# Patient Record
Sex: Female | Born: 1970
Health system: Southern US, Community
[De-identification: ages and names within clinical notes are randomized; demographics above are authoritative.]

## PROBLEM LIST (undated history)

## (undated) DIAGNOSIS — C50919 Malignant neoplasm of unspecified site of unspecified female breast: Secondary | ICD-10-CM

## (undated) DIAGNOSIS — J45909 Unspecified asthma, uncomplicated: Secondary | ICD-10-CM

## (undated) DIAGNOSIS — G43109 Migraine with aura, not intractable, without status migrainosus: Secondary | ICD-10-CM

## (undated) DIAGNOSIS — N809 Endometriosis, unspecified: Secondary | ICD-10-CM

## (undated) DIAGNOSIS — F32A Depression, unspecified: Secondary | ICD-10-CM

## (undated) DIAGNOSIS — G43909 Migraine, unspecified, not intractable, without status migrainosus: Secondary | ICD-10-CM

## (undated) DIAGNOSIS — K219 Gastro-esophageal reflux disease without esophagitis: Secondary | ICD-10-CM

## (undated) DIAGNOSIS — F329 Major depressive disorder, single episode, unspecified: Secondary | ICD-10-CM

## (undated) DIAGNOSIS — F419 Anxiety disorder, unspecified: Secondary | ICD-10-CM

## (undated) DIAGNOSIS — D649 Anemia, unspecified: Secondary | ICD-10-CM

## (undated) DIAGNOSIS — I10 Essential (primary) hypertension: Secondary | ICD-10-CM

## (undated) DIAGNOSIS — E669 Obesity, unspecified: Secondary | ICD-10-CM

## (undated) DIAGNOSIS — Z8719 Personal history of other diseases of the digestive system: Secondary | ICD-10-CM

## (undated) HISTORY — DX: Major depressive disorder, single episode, unspecified: F32.9

## (undated) HISTORY — DX: Migraine, unspecified, not intractable, without status migrainosus: G43.909

## (undated) HISTORY — PX: ESOPHAGOGASTRODUODENOSCOPY: SHX1529

## (undated) HISTORY — DX: Malignant neoplasm of unspecified site of unspecified female breast: C50.919

## (undated) HISTORY — PX: REDUCTION MAMMAPLASTY: SUR839

## (undated) HISTORY — PX: TUBAL LIGATION: SHX77

## (undated) HISTORY — DX: Obesity, unspecified: E66.9

## (undated) HISTORY — DX: Migraine with aura, not intractable, without status migrainosus: G43.109

## (undated) HISTORY — DX: Personal history of other diseases of the digestive system: Z87.19

## (undated) HISTORY — DX: Anxiety disorder, unspecified: F41.9

## (undated) HISTORY — PX: CHOLECYSTECTOMY: SHX55

## (undated) HISTORY — DX: Essential (primary) hypertension: I10

## (undated) HISTORY — DX: Depression, unspecified: F32.A

## (undated) HISTORY — DX: Endometriosis, unspecified: N80.9

## (undated) HISTORY — DX: Anemia, unspecified: D64.9

---

## 1998-01-09 ENCOUNTER — Other Ambulatory Visit: Admission: RE | Admit: 1998-01-09 | Discharge: 1998-01-09 | Payer: Self-pay | Admitting: Obstetrics

## 2000-01-15 ENCOUNTER — Other Ambulatory Visit: Admission: RE | Admit: 2000-01-15 | Discharge: 2000-01-15 | Payer: Self-pay | Admitting: Family Medicine

## 2000-05-02 ENCOUNTER — Emergency Department (HOSPITAL_COMMUNITY): Admission: EM | Admit: 2000-05-02 | Discharge: 2000-05-02 | Payer: Self-pay | Admitting: Emergency Medicine

## 2003-04-20 ENCOUNTER — Encounter: Payer: Self-pay | Admitting: Emergency Medicine

## 2003-04-20 ENCOUNTER — Emergency Department (HOSPITAL_COMMUNITY): Admission: EM | Admit: 2003-04-20 | Discharge: 2003-04-20 | Payer: Self-pay | Admitting: Emergency Medicine

## 2003-07-10 ENCOUNTER — Emergency Department (HOSPITAL_COMMUNITY): Admission: EM | Admit: 2003-07-10 | Discharge: 2003-07-11 | Payer: Self-pay | Admitting: Emergency Medicine

## 2003-07-24 ENCOUNTER — Observation Stay (HOSPITAL_COMMUNITY): Admission: RE | Admit: 2003-07-24 | Discharge: 2003-07-25 | Payer: Self-pay | Admitting: General Surgery

## 2003-07-24 ENCOUNTER — Encounter (INDEPENDENT_AMBULATORY_CARE_PROVIDER_SITE_OTHER): Payer: Self-pay

## 2003-07-28 ENCOUNTER — Emergency Department (HOSPITAL_COMMUNITY): Admission: EM | Admit: 2003-07-28 | Discharge: 2003-07-28 | Payer: Self-pay | Admitting: Emergency Medicine

## 2003-09-19 ENCOUNTER — Other Ambulatory Visit: Admission: RE | Admit: 2003-09-19 | Discharge: 2003-09-19 | Payer: Self-pay | Admitting: Family Medicine

## 2004-03-18 ENCOUNTER — Encounter: Admission: RE | Admit: 2004-03-18 | Discharge: 2004-03-18 | Payer: Self-pay | Admitting: Family Medicine

## 2004-09-11 ENCOUNTER — Ambulatory Visit: Payer: Self-pay | Admitting: Family Medicine

## 2004-09-11 ENCOUNTER — Other Ambulatory Visit: Admission: RE | Admit: 2004-09-11 | Discharge: 2004-09-11 | Payer: Self-pay | Admitting: Family Medicine

## 2004-10-28 ENCOUNTER — Ambulatory Visit: Payer: Self-pay | Admitting: Family Medicine

## 2004-10-30 ENCOUNTER — Ambulatory Visit: Payer: Self-pay | Admitting: Gastroenterology

## 2004-11-10 ENCOUNTER — Ambulatory Visit: Payer: Self-pay | Admitting: Family Medicine

## 2005-03-29 ENCOUNTER — Inpatient Hospital Stay (HOSPITAL_COMMUNITY): Admission: AD | Admit: 2005-03-29 | Discharge: 2005-03-29 | Payer: Self-pay | Admitting: Obstetrics

## 2005-04-05 ENCOUNTER — Inpatient Hospital Stay (HOSPITAL_COMMUNITY): Admission: AD | Admit: 2005-04-05 | Discharge: 2005-04-07 | Payer: Self-pay | Admitting: Obstetrics

## 2005-04-05 ENCOUNTER — Inpatient Hospital Stay (HOSPITAL_COMMUNITY): Admission: AD | Admit: 2005-04-05 | Discharge: 2005-04-05 | Payer: Self-pay | Admitting: Obstetrics

## 2006-01-08 ENCOUNTER — Other Ambulatory Visit: Admission: RE | Admit: 2006-01-08 | Discharge: 2006-01-08 | Payer: Self-pay | Admitting: Family Medicine

## 2006-01-08 ENCOUNTER — Encounter: Payer: Self-pay | Admitting: Family Medicine

## 2006-01-08 ENCOUNTER — Ambulatory Visit: Payer: Self-pay | Admitting: Family Medicine

## 2006-04-30 ENCOUNTER — Ambulatory Visit: Payer: Self-pay | Admitting: Family Medicine

## 2006-10-29 ENCOUNTER — Inpatient Hospital Stay (HOSPITAL_COMMUNITY): Admission: AD | Admit: 2006-10-29 | Discharge: 2006-10-29 | Payer: Self-pay | Admitting: Obstetrics and Gynecology

## 2006-12-12 ENCOUNTER — Inpatient Hospital Stay (HOSPITAL_COMMUNITY): Admission: AD | Admit: 2006-12-12 | Discharge: 2006-12-14 | Payer: Self-pay | Admitting: Obstetrics and Gynecology

## 2007-01-19 LAB — CONVERTED CEMR LAB: Pap Smear: NORMAL

## 2007-04-04 ENCOUNTER — Encounter: Payer: Self-pay | Admitting: Internal Medicine

## 2007-04-04 ENCOUNTER — Ambulatory Visit: Payer: Self-pay | Admitting: Family Medicine

## 2007-04-04 LAB — CONVERTED CEMR LAB
Bilirubin Urine: NEGATIVE
Blood in Urine, dipstick: NEGATIVE
Glucose, Urine, Semiquant: NEGATIVE
Ketones, urine, test strip: NEGATIVE
Nitrite: NEGATIVE
Protein, U semiquant: NEGATIVE
Specific Gravity, Urine: 1.02
Urobilinogen, UA: NEGATIVE
WBC Urine, dipstick: NEGATIVE
pH: 6

## 2007-04-08 LAB — CONVERTED CEMR LAB
ALT: 20 units/L (ref 0–35)
AST: 21 units/L (ref 0–37)
Albumin: 3.8 g/dL (ref 3.5–5.2)
Alkaline Phosphatase: 47 units/L (ref 39–117)
BUN: 6 mg/dL (ref 6–23)
Basophils Absolute: 0 10*3/uL (ref 0.0–0.1)
Basophils Relative: 0.7 % (ref 0.0–1.0)
Bilirubin, Direct: 0.2 mg/dL (ref 0.0–0.3)
CO2: 27 meq/L (ref 19–32)
Calcium: 8.9 mg/dL (ref 8.4–10.5)
Chloride: 106 meq/L (ref 96–112)
Cholesterol: 131 mg/dL (ref 0–200)
Creatinine, Ser: 0.7 mg/dL (ref 0.4–1.2)
Eosinophils Absolute: 0.1 10*3/uL (ref 0.0–0.6)
Eosinophils Relative: 2.1 % (ref 0.0–5.0)
GFR calc Af Amer: 123 mL/min
GFR calc non Af Amer: 102 mL/min
Glucose, Bld: 78 mg/dL (ref 70–99)
HCT: 35.7 % — ABNORMAL LOW (ref 36.0–46.0)
HDL: 33.8 mg/dL — ABNORMAL LOW (ref >39.0)
Hemoglobin: 12.4 g/dL (ref 12.0–15.0)
LDL Cholesterol: 84 mg/dL (ref 0–99)
Lymphocytes Relative: 54.2 % — ABNORMAL HIGH (ref 12.0–46.0)
MCHC: 34.6 g/dL (ref 30.0–36.0)
MCV: 91.1 fL (ref 78.0–100.0)
Monocytes Absolute: 0.3 10*3/uL (ref 0.2–0.7)
Monocytes Relative: 5.3 % (ref 3.0–11.0)
Neutro Abs: 2 10*3/uL (ref 1.4–7.7)
Neutrophils Relative %: 37.7 % — ABNORMAL LOW (ref 43.0–77.0)
Platelets: 210 10*3/uL (ref 150–400)
Potassium: 3.3 meq/L — ABNORMAL LOW (ref 3.5–5.1)
RBC: 3.92 M/uL (ref 3.87–5.11)
RDW: 14.4 % (ref 11.5–14.6)
Sodium: 141 meq/L (ref 135–145)
TSH: 0.55 microintl units/mL (ref 0.35–5.50)
Total Bilirubin: 1.2 mg/dL (ref 0.3–1.2)
Total CHOL/HDL Ratio: 3.9
Total Protein: 7.5 g/dL (ref 6.0–8.3)
Triglycerides: 66 mg/dL (ref 0–149)
VLDL: 13 mg/dL (ref 0–40)
WBC: 5.2 10*3/uL (ref 4.5–10.5)

## 2007-05-24 ENCOUNTER — Ambulatory Visit: Payer: Self-pay | Admitting: Family Medicine

## 2007-09-16 ENCOUNTER — Ambulatory Visit (HOSPITAL_COMMUNITY): Admission: RE | Admit: 2007-09-16 | Discharge: 2007-09-16 | Payer: Self-pay | Admitting: Obstetrics and Gynecology

## 2009-04-26 ENCOUNTER — Other Ambulatory Visit: Admission: RE | Admit: 2009-04-26 | Discharge: 2009-04-26 | Payer: Self-pay | Admitting: Family Medicine

## 2009-04-26 ENCOUNTER — Encounter: Payer: Self-pay | Admitting: Family Medicine

## 2009-04-26 ENCOUNTER — Ambulatory Visit: Payer: Self-pay | Admitting: Family Medicine

## 2009-04-27 LAB — CONVERTED CEMR LAB
ALT: 11 units/L (ref 0–35)
AST: 14 units/L (ref 0–37)
Albumin: 3.3 g/dL — ABNORMAL LOW (ref 3.5–5.2)
Alkaline Phosphatase: 44 units/L (ref 39–117)
BUN: 9 mg/dL (ref 6–23)
Basophils Absolute: 0 10*3/uL (ref 0.0–0.1)
Basophils Relative: 0.2 % (ref 0.0–3.0)
Bilirubin, Direct: 0.2 mg/dL (ref 0.0–0.3)
CO2: 25 meq/L (ref 19–32)
Calcium: 9 mg/dL (ref 8.4–10.5)
Chloride: 105 meq/L (ref 96–112)
Cholesterol: 137 mg/dL (ref 0–200)
Creatinine, Ser: 0.7 mg/dL (ref 0.4–1.2)
Eosinophils Absolute: 0.1 10*3/uL (ref 0.0–0.7)
Eosinophils Relative: 1.6 % (ref 0.0–5.0)
GFR calc non Af Amer: 121.7 mL/min (ref >60)
Glucose, Bld: 85 mg/dL (ref 70–99)
HCT: 39.2 % (ref 36.0–46.0)
HDL: 36.1 mg/dL — ABNORMAL LOW (ref >39.00)
Hemoglobin: 13 g/dL (ref 12.0–15.0)
LDL Cholesterol: 91 mg/dL (ref 0–99)
Lymphocytes Relative: 46.1 % — ABNORMAL HIGH (ref 12.0–46.0)
Lymphs Abs: 3.2 10*3/uL (ref 0.7–4.0)
MCHC: 33.1 g/dL (ref 30.0–36.0)
MCV: 85.9 fL (ref 78.0–100.0)
Monocytes Absolute: 0.4 10*3/uL (ref 0.1–1.0)
Monocytes Relative: 5.3 % (ref 3.0–12.0)
Neutro Abs: 3.2 10*3/uL (ref 1.4–7.7)
Neutrophils Relative %: 46.8 % (ref 43.0–77.0)
Platelets: 176 10*3/uL (ref 150.0–400.0)
Potassium: 4.2 meq/L (ref 3.5–5.1)
RBC: 4.56 M/uL (ref 3.87–5.11)
RDW: 13.8 % (ref 11.5–14.6)
Sodium: 139 meq/L (ref 135–145)
TSH: 0.93 microintl units/mL (ref 0.35–5.50)
Total Bilirubin: 0.8 mg/dL (ref 0.3–1.2)
Total CHOL/HDL Ratio: 4
Total Protein: 7 g/dL (ref 6.0–8.3)
Triglycerides: 52 mg/dL (ref 0.0–149.0)
VLDL: 10.4 mg/dL (ref 0.0–40.0)
WBC: 6.9 10*3/uL (ref 4.5–10.5)

## 2009-05-02 ENCOUNTER — Encounter (INDEPENDENT_AMBULATORY_CARE_PROVIDER_SITE_OTHER): Payer: Self-pay | Admitting: *Deleted

## 2010-01-27 ENCOUNTER — Telehealth (INDEPENDENT_AMBULATORY_CARE_PROVIDER_SITE_OTHER): Payer: Self-pay | Admitting: *Deleted

## 2010-01-29 ENCOUNTER — Telehealth: Payer: Self-pay | Admitting: Family Medicine

## 2010-02-06 ENCOUNTER — Encounter: Admission: RE | Admit: 2010-02-06 | Discharge: 2010-02-06 | Payer: Self-pay | Admitting: Family Medicine

## 2010-05-02 ENCOUNTER — Ambulatory Visit: Payer: Self-pay | Admitting: Family Medicine

## 2010-05-02 ENCOUNTER — Other Ambulatory Visit: Admission: RE | Admit: 2010-05-02 | Discharge: 2010-05-02 | Payer: Self-pay | Admitting: Family Medicine

## 2010-05-02 LAB — CONVERTED CEMR LAB
Bilirubin Urine: NEGATIVE
Glucose, Urine, Semiquant: NEGATIVE
Ketones, urine, test strip: NEGATIVE
Nitrite: NEGATIVE
Protein, U semiquant: NEGATIVE
Specific Gravity, Urine: 1.02
Urobilinogen, UA: NEGATIVE
WBC Urine, dipstick: NEGATIVE
pH: 5

## 2010-05-05 LAB — CONVERTED CEMR LAB
ALT: 11 units/L (ref 0–35)
AST: 15 units/L (ref 0–37)
Albumin: 3.5 g/dL (ref 3.5–5.2)
Alkaline Phosphatase: 56 units/L (ref 39–117)
BUN: 12 mg/dL (ref 6–23)
Basophils Absolute: 0 10*3/uL (ref 0.0–0.1)
Basophils Relative: 0.6 % (ref 0.0–3.0)
Bilirubin, Direct: 0.1 mg/dL (ref 0.0–0.3)
CO2: 27 meq/L (ref 19–32)
Calcium: 9 mg/dL (ref 8.4–10.5)
Chloride: 104 meq/L (ref 96–112)
Cholesterol: 128 mg/dL (ref 0–200)
Creatinine, Ser: 0.8 mg/dL (ref 0.4–1.2)
Eosinophils Absolute: 0.2 10*3/uL (ref 0.0–0.7)
Eosinophils Relative: 2.8 % (ref 0.0–5.0)
GFR calc non Af Amer: 110.06 mL/min (ref >60)
Glucose, Bld: 81 mg/dL (ref 70–99)
HCT: 36.4 % (ref 36.0–46.0)
HDL: 38.1 mg/dL — ABNORMAL LOW (ref >39.00)
Hemoglobin: 12.1 g/dL (ref 12.0–15.0)
LDL Cholesterol: 84 mg/dL (ref 0–99)
Lymphocytes Relative: 38.9 % (ref 12.0–46.0)
Lymphs Abs: 2.2 10*3/uL (ref 0.7–4.0)
MCHC: 33.4 g/dL (ref 30.0–36.0)
MCV: 82.8 fL (ref 78.0–100.0)
Monocytes Absolute: 0.5 10*3/uL (ref 0.1–1.0)
Monocytes Relative: 8.5 % (ref 3.0–12.0)
Neutro Abs: 2.8 10*3/uL (ref 1.4–7.7)
Neutrophils Relative %: 49.2 % (ref 43.0–77.0)
Platelets: 269 10*3/uL (ref 150.0–400.0)
Potassium: 4.1 meq/L (ref 3.5–5.1)
RBC: 4.39 M/uL (ref 3.87–5.11)
RDW: 16.3 % — ABNORMAL HIGH (ref 11.5–14.6)
Sodium: 139 meq/L (ref 135–145)
TSH: 0.67 microintl units/mL (ref 0.35–5.50)
Total Bilirubin: 0.5 mg/dL (ref 0.3–1.2)
Total CHOL/HDL Ratio: 3
Total Protein: 7.2 g/dL (ref 6.0–8.3)
Triglycerides: 32 mg/dL (ref 0.0–149.0)
VLDL: 6.4 mg/dL (ref 0.0–40.0)
WBC: 5.7 10*3/uL (ref 4.5–10.5)

## 2010-05-08 ENCOUNTER — Encounter (INDEPENDENT_AMBULATORY_CARE_PROVIDER_SITE_OTHER): Payer: Self-pay | Admitting: *Deleted

## 2010-05-08 LAB — CONVERTED CEMR LAB: Pap Smear: NEGATIVE

## 2010-05-16 ENCOUNTER — Emergency Department (HOSPITAL_COMMUNITY): Admission: EM | Admit: 2010-05-16 | Discharge: 2010-05-16 | Payer: Self-pay | Admitting: Emergency Medicine

## 2010-06-02 ENCOUNTER — Ambulatory Visit: Payer: Self-pay | Admitting: Family Medicine

## 2010-06-02 LAB — CONVERTED CEMR LAB: Rapid Strep: POSITIVE

## 2010-10-05 ENCOUNTER — Encounter (HOSPITAL_COMMUNITY): Payer: Self-pay | Admitting: Obstetrics and Gynecology

## 2010-10-16 NOTE — Progress Notes (Signed)
Summary: Note for Weight Watchers  Phone Note Call from Patient Call back at Home Phone [phone]   Caller: Patient Summary of Call: Message Left on Danielle's VM: Patient needs something from the Dr. indicating that it is ok for her do do weight watchers so that she can use her FSA (Flexibile spending account)    Jody [name]  Jan 29, 2010 5:01 PM   Follow-up for Phone Call        ok to give note Follow-up by: Loreen Freud DO,  Jan 30, 2010 8:17 AM  Additional Follow-up for Phone Call Additional follow up Details #1::        Patient would like this written on rx.  Additional Follow-up by: Harold Barban,  Feb 03, 2010 10:08 AM    New/Updated Medications: * WEIGHT WATCHERS 278.0 Prescriptions: WEIGHT WATCHERS 278.0  #1 x 0   Entered and Authorized by:   Loreen Freud DO   Signed by:   Loreen Freud DO on 02/03/2010   Method used:   Print then Give to Patient   RxID:   [id number]

## 2010-10-16 NOTE — Progress Notes (Signed)
Summary: re;mamogram  Phone Note Call from Patient   Caller: Patient Summary of Call: mother was dx with breast cancer in november, PT NOT HAVING ANY SX;  Wanted to know should she have mammogram sooner. --Scheduled pt CPX 05/02/10,  .Kandice Hams  Jan 27, 2010 3:50 PM  Initial call taken by: Kandice Hams,  Jan 27, 2010 3:50 PM  Follow-up for Phone Call        she could have her baseline now and if normal she would have them annually at age 40---  she can schedule at the breast center or we can do it for her. Follow-up by: Loreen Freud DO,  Jan 27, 2010 4:52 PM  Additional Follow-up for Phone Call Additional follow up Details #1::        left msg for pt  per Dr Laury Axon should have baseline now and order is in , our referral coordinator will be calling with a date and time, any questions give our office a call .Kandice Hams  Jan 27, 2010 5:04 PM  Additional Follow-up by: Kandice Hams,  Jan 27, 2010 5:04 PM  New Problems: FAMILY HISTORY BREAST CANCER 1ST DEGREE RELATIVE <50 (ICD-V16.3)   New Problems: FAMILY HISTORY BREAST CANCER 1ST DEGREE RELATIVE <50 (ICD-V16.3)    Family History: Family History Hypertension Family History Breast cancer 1st degree relative <50

## 2010-10-16 NOTE — Assessment & Plan Note (Signed)
Summary: cpx/alr   Vital Signs:  Patient profile:   40 year old female Height:      65.25 inches Weight:      286 pounds BMI:     47.40 Temp:     98.7 degrees F oral Pulse rate:   80 / minute Resp:     20 per minute BP sitting:   130 / 82  (left arm)  Vitals Entered By: Jeremy Johann CMA (May 02, 2010 8:42 AM) CC: cpx,pap, fasting  Vision Screening:      Vision Comments:  vision corrected with glasses/or contacts---sees optho q1y 40db HL: Left  Right  Audiometry Comment: grossly normal    History of Present Illness: Pt here for cpe , pap, and labs.  Pt has eye dr appointment today--pt is having itchy red eyes.   Pt mother dx with breast Cancer and things are not going as well as planned.  Pt is stressed.   Preventive Screening-Counseling & Management  Alcohol-Tobacco     Alcohol drinks/day: 0     Smoking Status: never     Passive Smoke Exposure: no  Caffeine-Diet-Exercise     Caffeine use/day: 2     Does Patient Exercise: yes     Type of exercise: walk      Exercise (avg: min/session): <30     Times/week: 5  Hep-HIV-STD-Contraception     HIV Risk: no     Dental Visit-last 6 months yes     Dental Care Counseling: not indicated; dental care within six months     SBE monthly: yes     SBE Education/Counseling: not indicated; SBE done regularly  Safety-Violence-Falls     Seat Belt Use: yes      Drug Use:  never.    Current Medications (verified): 1)  Weight Watchers .Marland Kitchen.. 278.0  Allergies (verified): No Known Drug Allergies  Past History:  Past Medical History: Last updated: 04/04/2007 Unremarkable  Past Surgical History: Last updated: 04/26/2009 Cholecystectomy (2005) sterilization procedure 07/2007--- Dr Renaldo Fiddler  Family History: Last updated: 05/02/2010 Family History Hypertension Family History Breast cancer 1st degree relative <50 Family History of CAD Female 1st degree relative <60 Family History Weight disorder fam hx cataracts fam  hx glaucoma  Social History: Last updated: 04/04/2007 Occupation:proc. ins.-- [employer] Never Smoked Alcohol use-no Drug use-no Regular exercise-yes  Risk Factors: Alcohol Use: 0 (05/02/2010) Caffeine Use: 2 (05/02/2010) Exercise: yes (05/02/2010)  Risk Factors: Smoking Status: never (05/02/2010) Passive Smoke Exposure: no (05/02/2010)  Family History: Reviewed history from 01/27/2010 and no changes required. Family History Hypertension Family History Breast cancer 1st degree relative <50 Family History of CAD Female 1st degree relative <60 Family History Weight disorder fam hx cataracts fam hx glaucoma  Social History: Reviewed history from 04/04/2007 and no changes required. 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 Eyes:  Complains of itching and red eye; denies blurring, discharge, double vision, eye irritation, eye pain, halos, light sensitivity, vision loss-1 eye, and vision loss-both eyes; pt to see optho today--[facility]. ENT:  Denies decreased hearing, difficulty swallowing, ear discharge, earache, hoarseness, nasal congestion, nosebleeds, postnasal drainage, ringing in ears, sinus pressure, and sore throat. CV:  Denies bluish discoloration of lips or nails, chest pain or discomfort, difficulty breathing at night, difficulty breathing while lying down,  fainting, fatigue, leg cramps with exertion, lightheadness, near fainting, palpitations, shortness of breath with exertion, swelling of feet, swelling of hands, and weight gain. Resp:  Denies chest discomfort, chest pain with inspiration, cough, coughing up blood, excessive snoring, hypersomnolence, morning headaches, pleuritic, shortness of breath, sputum productive, and  wheezing. GI:  Denies abdominal pain, bloody stools, change in bowel habits, constipation, dark tarry stools, diarrhea, excessive appetite, gas, hemorrhoids, indigestion, loss of appetite, and nausea. GU:  Denies abnormal vaginal bleeding, decreased libido, discharge, dysuria, genital sores, hematuria, incontinence, nocturia, urinary frequency, and urinary hesitancy. MS:  Denies joint pain, joint redness, joint swelling, loss of strength, low back pain, mid back pain, muscle aches, muscle , cramps, muscle weakness, stiffness, and thoracic pain. Derm:  Denies changes in color of skin, changes in nail beds, dryness, excessive perspiration, flushing, hair loss, insect bite(s), itching, lesion(s), poor wound healing, and rash. Neuro:  Denies brief paralysis, difficulty with concentration, disturbances in coordination, falling down, headaches, inability to speak, memory loss, numbness, poor balance, seizures, sensation of room spinning, tingling, tremors, visual disturbances, and weakness. Psych:  Denies alternate hallucination ( auditory/visual), anxiety, depression, easily angered, easily tearful, irritability, mental problems, panic attacks, sense of great danger, suicidal thoughts/plans, thoughts of violence, unusual visions or sounds, and thoughts /plans of harming others. Endo:  Denies cold intolerance, excessive hunger, excessive thirst, excessive urination, heat intolerance, polyuria, and weight change. Heme:  Denies abnormal bruising, bleeding, enlarge lymph nodes, fevers, pallor, and skin discoloration. Allergy:  Denies hives or rash, itching eyes, persistent infections, seasonal allergies, and sneezing.  Physical Exam  General:  Well-developed,well-nourished,in no acute distress; alert,appropriate and cooperative throughout examinationoverweight-appearing.   Head:  Normocephalic and atraumatic without obvious abnormalities. No apparent alopecia or balding. Eyes:  vision grossly intact, pupils  equal, pupils round, pupils reactive to light, and no injection.   Ears:  External ear exam shows no significant lesions or deformities.  Otoscopic examination reveals clear canals, tympanic membranes are intact bilaterally without bulging, retraction, inflammation or discharge. Hearing is grossly normal bilaterally. Nose:  External nasal examination shows no deformity or inflammation. Nasal mucosa are pink and moist without lesions or exudates. Mouth:  Oral mucosa and oropharynx without lesions or exudates.  Teeth in good repair. Neck:  No deformities, masses, or tenderness noted. Chest Wall:  No deformities, masses, or tenderness noted. Breasts:  No mass, nodules, thickening, tenderness, bulging, retraction, inflamation, nipple discharge or skin changes noted.   Lungs:  Normal respiratory effort, chest expands symmetrically. Lungs are clear to auscultation, no crackles or wheezes. Heart:  normal rate and no murmur.   Abdomen:  Bowel sounds positive,abdomen soft and non-tender without masses, organomegaly or hernias noted. Genitalia:  Pelvic Exam:        External: normal female genitalia without lesions or masses        Vagina: normal without lesions or masses        Cervix: normal without lesions or masses        Adnexa: normal bimanual exam without masses or fullness        Uterus: normal by palpation        Pap smear: performed Msk:  normal ROM, no joint tenderness, no joint swelling, no joint warmth, no redness over joints, no joint deformities, no joint instability, and no crepitation.   Pulses:  R posterior tibial normal, R dorsalis pedis normal, L posterior tibial normal, and L dorsalis pedis normal.   Extremities:  No clubbing, cyanosis, edema, or deformity noted with  normal full range of motion of all joints.   Neurologic:  No cranial nerve deficits noted. Station and gait are normal. Plantar reflexes are down-going bilaterally. DTRs are symmetrical throughout. Sensory, motor and  coordinative functions appear intact. Skin:  Intact without suspicious lesions or rashes Cervical Nodes:  No lymphadenopathy noted Axillary Nodes:  No palpable lymphadenopathy Psych:  Cognition and judgment appear intact. Alert and cooperative with normal attention span and concentration. No apparent delusions, illusions, hallucinations   Impression & Recommendations:  Problem # 1:  PREVENTIVE HEALTH CARE (ICD-V70.0) ghm utd check labs Orders: Venipuncture (21308) TLB-Lipid Panel (80061-LIPID) TLB-BMP (Basic Metabolic Panel-BMET) (80048-METABOL) TLB-CBC Platelet - w/Differential (85025-CBCD) TLB-Hepatic/Liver Function Pnl (80076-HEPATIC) TLB-TSH (Thyroid Stimulating Hormone) (84443-TSH) Specimen Handling (65784) UA Dipstick w/o Micro (manual) (81002)  Problem # 2:  OBESITY, MORBID (ICD-278.01) diet and exercise d/w pt Ht: 65.25 (05/02/2010)   Wt: 286 (05/02/2010)   BMI: 47.40 (05/02/2010)  Complete Medication List: 1)  Weight Watchers  .Marland Kitchen.. 278.0 pt may participate in weight watchers Prescriptions: WEIGHT WATCHERS 278.0 Pt may participate in weight watchers  #1 x 0   Entered and Authorized by:   Loreen Freud DO   Signed by:   Loreen Freud DO on 05/02/2010   Method used:   Print then Give to Patient   RxID:   [id number]   Flu Vaccine Next Due:  Not Indicated   Mammogram  Procedure date:  05/02/2010  Findings:      No specific mammographic evidence of malignancy.  [facility]: [facility].     Laboratory Results   Urine Tests   Date/Time Reported: May 02, 2010 9:52 AM   Routine Urinalysis   Color: straw Appearance: Clear Glucose: negative   (Normal Range: Negative) Bilirubin: negative   (Normal Range: Negative) Ketone: negative   (Normal Range: Negative) Spec. Gravity: 1.020   (Normal Range: 1.003-1.035) Blood: moderate   (Normal Range: Negative) pH: 5.0   (Normal Range: 5.0-8.0) Protein: negative   (Normal Range:  Negative) Urobilinogen: negative   (Normal Range: 0-1) Nitrite: negative   (Normal Range: Negative) Leukocyte Esterace: negative   (Normal Range: Negative)    Comments: Floydene Flock  May 02, 2010 9:52 AM pt. had pap  (blood)  no cx sent

## 2010-10-16 NOTE — Assessment & Plan Note (Signed)
Summary: STREP THROAT??//KN   Vital Signs:  Patient profile:   40 year old female Height:      65.25 inches (165.74 cm) Weight:      286.50 pounds (130.23 kg) BMI:     47.48 Temp:     100.0 degrees F (37.78 degrees C) oral BP sitting:   128 / 86  (left arm) Cuff size:   large  Vitals Entered By: Lucious Groves CMA (June 02, 2010 11:41 AM) CC: C/O possible strep throat./kb Is Patient Diabetic? No Pain Assessment Patient in pain? no      Comments Patient notes that both of her children were verified positive this morning. Patient notes that she has been having sore throat, diarrhea, HA, and body aches. Patient was unsure of fever./kb   History of Present Illness: 40 yo woman here today for ? strep throat.  both children have strep.  sxs started early this AM w/ sore throat.  + HA and body aches.  + diarrhea this AM x1.  unsure of fever.  Current Medications (verified): 1)  Weight Watchers .Marland Kitchen.. 278.0 Pt May Participate in Weight Watchers  Allergies (verified): No Known Drug Allergies  Review of Systems      See HPI  Physical Exam  General:  Well-developed,well-nourished,in no acute distress; alert,appropriate and cooperative throughout examinationoverweight-appearing.   Head:  Normocephalic and atraumatic without obvious abnormalities. No apparent alopecia or balding.  no TTP over sinuses Eyes:  no injxn or inflammation Ears:  R ear normal and L ear normal.   Nose:  External nasal examination shows no deformity or inflammation. Nasal mucosa are pink and moist without lesions or exudates. Mouth:  tonsilar enlargement w/ erythema and exudate Neck:  + ant chain LAD Lungs:  Normal respiratory effort, chest expands symmetrically. Lungs are clear to auscultation, no crackles or wheezes. Heart:  normal rate and no murmur.     Impression & Recommendations:  Problem # 1:  PHARYNGITIS-ACUTE (ICD-462) Assessment New  pt w/ + strep test.  start amox.  reviewed supportive care  and red flags that should prompt return.  Pt expresses understanding and is in agreement w/ this plan. Her updated medication list for this problem includes:    Amoxicillin 500 Mg Tabs (Amoxicillin) .Marland Kitchen... 1 tab by mouth two times a day x10 days.  take w/ food.  Orders: Rapid Strep (60454)  Complete Medication List: 1)  Weight Watchers  .Marland Kitchen.. 278.0 pt may participate in weight watchers 2)  Amoxicillin 500 Mg Tabs (Amoxicillin) .Marland Kitchen.. 1 tab by mouth two times a day x10 days.  take w/ food.  Patient Instructions: 1)  This is strep throat 2)  Take the Amoxicillin as directed 3)  Tylenol/Ibuprofen as needed for pain or fever 4)  Drink plenty of fluids 5)  Hang in there! Prescriptions: AMOXICILLIN 500 MG TABS (AMOXICILLIN) 1 tab by mouth two times a day x10 days.  take w/ food.  #20 x 0   Entered and Authorized by:   Neena Rhymes MD   Signed by:   Neena Rhymes MD on 06/02/2010   Method used:   Electronically to        [facility]  [address] [phone]* (retail)       [address]       [place], [place]  [id number]       Ph: [phone] or [phone]       Fax: [phone]   RxID:  [id number]   Laboratory Results    Wet [place]/KOH  Other Tests  Rapid Strep: positive  Kit Test Internal QC: Positive   (Normal Range: Negative)

## 2010-10-16 NOTE — Letter (Signed)
Summary: Results Follow up Letter  [facility] [place]  [address] [place], [place] [address]   Phone: [phone]  Fax: [phone]    05/08/2010 MRN: [id number]  [facility] [address] [place], [place]  [address]  Dear Ms. Teaney,  The following are the results of your recent test(s):  Test         Result    Pap Smear:        Normal __X___  Not Normal _____ Comments: ______________________________________________________ Cholesterol: LDL(Bad cholesterol):         Your goal is less than:         HDL (Good cholesterol):       Your goal is more than: Comments:  ______________________________________________________ Mammogram:        Normal _____  Not Normal _____ Comments:  ___________________________________________________________________ Hemoccult:        Normal _____  Not normal _______ Comments:    _____________________________________________________________________ Other Tests:    We routinely do not discuss normal results over the telephone.  If you desire a copy of the results, or you have any questions about this information we can discuss them at your next office visit.   Sincerely,

## 2010-11-27 LAB — POCT RAPID STREP A (OFFICE): Streptococcus, Group A Screen (Direct): POSITIVE — AB

## 2010-12-10 ENCOUNTER — Inpatient Hospital Stay (INDEPENDENT_AMBULATORY_CARE_PROVIDER_SITE_OTHER)
Admission: RE | Admit: 2010-12-10 | Discharge: 2010-12-10 | Disposition: A | Discharge status: Home / Self Care | Payer: BC Managed Care – PPO | Source: Ambulatory Visit | Attending: Emergency Medicine | Admitting: Emergency Medicine

## 2010-12-10 DIAGNOSIS — R51 Headache: Secondary | ICD-10-CM

## 2010-12-10 DIAGNOSIS — H53139 Sudden visual loss, unspecified eye: Secondary | ICD-10-CM

## 2010-12-11 ENCOUNTER — Observation Stay (HOSPITAL_COMMUNITY)
Admission: EM | Admit: 2010-12-11 | Discharge: 2010-12-12 | Disposition: A | Discharge status: Home / Self Care | Payer: BC Managed Care – PPO | Attending: Internal Medicine | Admitting: Internal Medicine

## 2010-12-11 ENCOUNTER — Observation Stay (HOSPITAL_COMMUNITY): Payer: BC Managed Care – PPO

## 2010-12-11 ENCOUNTER — Emergency Department (HOSPITAL_COMMUNITY): Payer: BC Managed Care – PPO

## 2010-12-11 DIAGNOSIS — H539 Unspecified visual disturbance: Secondary | ICD-10-CM | POA: Insufficient documentation

## 2010-12-11 DIAGNOSIS — E538 Deficiency of other specified B group vitamins: Secondary | ICD-10-CM | POA: Insufficient documentation

## 2010-12-11 DIAGNOSIS — G43909 Migraine, unspecified, not intractable, without status migrainosus: Principal | ICD-10-CM | POA: Insufficient documentation

## 2010-12-11 LAB — APTT: aPTT: 28 seconds (ref 24–37)

## 2010-12-11 LAB — URINALYSIS, ROUTINE W REFLEX MICROSCOPIC
Bilirubin Urine: NEGATIVE
Glucose, UA: NEGATIVE mg/dL
Hgb urine dipstick: NEGATIVE
Ketones, ur: NEGATIVE mg/dL
Nitrite: NEGATIVE
Protein, ur: NEGATIVE mg/dL
Specific Gravity, Urine: 1.023 (ref 1.005–1.030)
Urobilinogen, UA: 0.2 mg/dL (ref 0.0–1.0)
pH: 6 (ref 5.0–8.0)

## 2010-12-11 LAB — RAPID URINE DRUG SCREEN, HOSP PERFORMED
Amphetamines: NOT DETECTED
Barbiturates: NOT DETECTED
Benzodiazepines: NOT DETECTED
Cocaine: NOT DETECTED
Opiates: NOT DETECTED
Tetrahydrocannabinol: NOT DETECTED

## 2010-12-11 LAB — COMPREHENSIVE METABOLIC PANEL
ALT: 13 U/L (ref 0–35)
AST: 24 U/L (ref 0–37)
Albumin: 3.5 g/dL (ref 3.5–5.2)
Alkaline Phosphatase: 58 U/L (ref 39–117)
BUN: 9 mg/dL (ref 6–23)
CO2: 25 mEq/L (ref 19–32)
Calcium: 9.1 mg/dL (ref 8.4–10.5)
Chloride: 105 mEq/L (ref 96–112)
Creatinine, Ser: 0.8 mg/dL (ref 0.4–1.2)
GFR calc Af Amer: >60 mL/min (ref >60)
GFR calc non Af Amer: >60 mL/min (ref >60)
Glucose, Bld: 83 mg/dL (ref 70–99)
Potassium: 4.1 mEq/L (ref 3.5–5.1)
Sodium: 137 mEq/L (ref 135–145)
Total Bilirubin: 0.5 mg/dL (ref 0.3–1.2)
Total Protein: 7.7 g/dL (ref 6.0–8.3)

## 2010-12-11 LAB — TROPONIN I: Troponin I: 0.02 ng/mL (ref 0.00–0.06)

## 2010-12-11 LAB — CBC
HCT: 39.4 % (ref 36.0–46.0)
Hemoglobin: 12.4 g/dL (ref 12.0–15.0)
MCH: 26 pg (ref 26.0–34.0)
MCHC: 31.5 g/dL (ref 30.0–36.0)
MCV: 82.6 fL (ref 78.0–100.0)
Platelets: 291 10*3/uL (ref 150–400)
RBC: 4.77 MIL/uL (ref 3.87–5.11)
RDW: 15.5 % (ref 11.5–15.5)
WBC: 6 10*3/uL (ref 4.0–10.5)

## 2010-12-11 LAB — PROTIME-INR
INR: 1.03 (ref 0.00–1.49)
Prothrombin Time: 13.7 seconds (ref 11.6–15.2)

## 2010-12-11 LAB — PREGNANCY, URINE: Preg Test, Ur: NEGATIVE

## 2010-12-11 LAB — CK TOTAL AND CKMB (NOT AT ARMC)
Relative Index: 0.4 (ref 0.0–2.5)
Total CK: 223 U/L — ABNORMAL HIGH (ref 7–177)

## 2010-12-11 LAB — ETHANOL: Alcohol, Ethyl (B): <5 mg/dL (ref 0–10)

## 2010-12-12 DIAGNOSIS — H5310 Unspecified subjective visual disturbances: Secondary | ICD-10-CM

## 2010-12-12 DIAGNOSIS — I059 Rheumatic mitral valve disease, unspecified: Secondary | ICD-10-CM

## 2010-12-12 LAB — CARDIAC PANEL(CRET KIN+CKTOT+MB+TROPI)
CK, MB: 0.9 ng/mL (ref 0.3–4.0)
CK, MB: 0.7 ng/mL (ref 0.3–4.0)
Total CK: 163 U/L (ref 7–177)
Troponin I: 0.01 ng/mL (ref 0.00–0.06)

## 2010-12-12 LAB — LIPID PANEL
Cholesterol: 125 mg/dL (ref 0–200)
LDL Cholesterol: 77 mg/dL (ref 0–99)

## 2010-12-12 NOTE — H&P (Signed)
[name], [name]             ACCOUNT NO.:  [id number]  MEDICAL RECORD NO.:  [id number]           PATIENT TYPE:  E  LOCATION:  [facility]                         FACILITY:  [facility]  PHYSICIAN:  Gwenyth Bender, NP      DATE OF BIRTH:  Aug 28, 1971  DATE OF ADMISSION:  12/11/2010 DATE OF DISCHARGE:                             HISTORY & PHYSICAL   PRIMARY CARE PROVIDER:  Lelon Perla, DO with [facility].  CHIEF COMPLAINT:  Visual disturbances.  HISTORY OF PRESENT ILLNESS:  Jody [name] is a very pleasant 40 year old female with a history of migraine headaches who presents to the [facility] ED with a chief complaint of visual disturbances.  Information is obtained from the patient.  She reports that yesterday she experienced some visual obscurities while sitting at her computer at work.  She describes as being able to see color and light but no letters or words on the computer screen.  Associated symptoms do include a posterior headache, some mild nausea and dizziness.  She states that she put her head down on the desk and waited for it to pass, which it did after about 20 minutes.  She denies any numbness, tingling, chest pain, palpitations, shortness of breath, or difficulty swallowing or speaking. She does report some photophobia.  She also reports that she has had episodes similar to this approximately every other day for the last 3 weeks or so.  She does indicate that prior episodes were much briefer, specifically less than 5 minutes.  She indicates that yesterday the episode lasted longer and it frightened her so she called her primary care provider who recommended that she go to the Urgent Care Center. She went to the [facility] and they initiated workup and she had to wait but could not stay to complete the workup as she is a single parent, had to pick up her small children.  She returned today to complete the workup.  The symptoms have not reoccurred.  She has  not had an episode today.  She states that she was able to eat a small dinner last night but did have difficulty sleeping due to anxiety.  She indicates she took some ibuprofen for the headache with little relief. She also reports she has been under a lot of stress with a full-time job, three children under the age of 61, and her disabled father recently moved in with her and she is now the primary caregiver for him as well. Symptoms came on gradually, have persisted, and are characterized as moderate.  Nothing makes them better or worse.  We are asked to admit for further evaluation and treatment.  ALLERGIES:  No known drug allergies.  PAST MEDICAL HISTORY:  Migraine.  PAST SURGICAL HISTORY:  Cholecystectomy approximately 5 years ago.  FAMILY MEDICAL HISTORY:  Mother is alive 71 years old, is breast cancer survivor, has hypertension.  Father is alive, 65 years old, has hepatitis C, has had a hip replaced, and also has hypertension.  She has no siblings.  Her grandmother on her mother's side died at age 13 from a heart attack.  She denies any aunts and uncles who have had MI's or strokes.  MEDICATIONS:  None.  REVIEW OF SYSTEMS:  GENERAL:  Denies fevers, chills, anorexia, unintentional weight loss. ENT:  Denies ear pain, nasal congestion, sore throat. CVS:  Denies chest pain, palpitations, lower extremity edema. RESPIRATORY:  Denies any shortness of breath or cough. MUSCULOSKELETAL:  Denies any joint pain, muscle weakness. NEURO:  See HPI. GI:  Positive for some mild nausea.  Denies vomiting, abdominal pain, constipation, diarrhea or melena. GU: Denies dysuria, hematuria, frequency or urgency. PSYCH:  Positive for some anxiety. HEME:  Denies any unusual bruising or bleeding.  LABORATORY DATA:  Sodium 137, potassium 4.1, chloride 105, CO2 25, BUN 9, creatinine 0.80, glucose 83.  WBC is 6.0, hemoglobin 12.4, hematocrit 39.4, platelets 291.  PTT 28, PT 13.7, INR 1.03.  Urine drug  screen is negative.  Urinalysis is negative.  EtOH level is less than 5.  Urine pregnancy test is negative.  RADIOLOGY:  CT of the head yields no acute intracranial abnormality.  PHYSICAL EXAMINATION:  VITAL SIGNS:  Temperature 98.4, blood pressure 133/69, heart rate 70, respirations 20, saturations 100% on room air. GENERAL:  Awake, alert, obese.  No acute distress. HEAD:  Normocephalic, atraumatic.  Pupils equal, round, reactive to light.  Mucous membranes of her mouth are moist and pink.  No obvious lesion or exudate in her nose or ears. NECK:  Supple.  No JVD.  Full range of motion.  No lymphadenopathy. CARDIOVASCULAR:  Regular rate and rhythm.  No murmur, gallop or rub.  No lower extremity edema.  Pedal pulses present and palpable RESPIRATORY:  No increased work of breathing.  Breath sounds clear to auscultation bilaterally.  No rhonchi, wheezes or rales. ABDOMEN:  Obese, soft, positive bowel sounds but somewhat sluggish, nontender to palpation. NEUROLOGIC:  Alert and oriented x3.  Speech clear.  Facial symmetry. Cranial nerves II-XII grossly intact.  Upper extremity strength 5/5, lower extremity strength 5/5. MUSCULOSKELETAL:  Moves all extremities.  Full-range of motion.  No joint swelling/erythema, nontender to palpation.  EXTREMITIES:  Without clubbing or cyanosis.  ASSESSMENT AND PLAN: 1. Visual disturbances, questionable transient ischemic attack versus     atypical migraine.  Will admit to telemetry.  Will workup for     transient ischemic attack given risk factors.  Specifically, will     get bedside swallow evaluation.  Will cycle cardiac enzymes, check     fasting lipid panel.  Will do frequent neuro checks.  We will also     get an EKG, 2-D echo, carotid Dopplers, MRI, MRA of the brain.  We     will request a neuro consult. 2. History of migraines:  See problem #1.  The patient does indicate     that she has never been officially diagnosed or gone to a      neurologist, states that she takes medications over-the-counter. 3. Deep venous thrombosis prophylaxis:  Will use sequential     compressive devices.  CODE STATUS:  The patient is a Full Code.  The case was discussed with Dr. Brien Few.  Dictated For:  Isidor Holts, MD     Gwenyth Bender, NP     KMB/MEDQ  D:  12/11/2010  T:  12/11/2010  Job:  [id number]  cc:   Lelon Perla, DO [address] [place], [place] [address]  Electronically Signed by Toya Smothers  on 12/12/2010 01:54:09 PM

## 2010-12-15 ENCOUNTER — Encounter: Payer: Self-pay | Admitting: Family Medicine

## 2010-12-15 ENCOUNTER — Ambulatory Visit (INDEPENDENT_AMBULATORY_CARE_PROVIDER_SITE_OTHER): Payer: BC Managed Care – PPO | Admitting: Family Medicine

## 2010-12-15 VITALS — BP 128/72 | HR 80 | Wt 298.4 lb

## 2010-12-15 DIAGNOSIS — F439 Reaction to severe stress, unspecified: Secondary | ICD-10-CM

## 2010-12-15 DIAGNOSIS — G43909 Migraine, unspecified, not intractable, without status migrainosus: Secondary | ICD-10-CM

## 2010-12-15 DIAGNOSIS — Z733 Stress, not elsewhere classified: Secondary | ICD-10-CM

## 2010-12-15 LAB — VITAMIN D 1,25 DIHYDROXY
Vitamin D 1, 25 (OH)2 Total: 50 pg/mL (ref 18–72)
Vitamin D2 1, 25 (OH)2: <8 pg/mL

## 2010-12-15 MED ORDER — TOPIRAMATE 25 MG PO TABS: ORAL_TABLET | ORAL | Status: DC

## 2010-12-15 NOTE — Assessment & Plan Note (Signed)
rec psych--- list given to pt Pt out of work 2 weeks  rto 2 weeks or sooner prn

## 2010-12-15 NOTE — Progress Notes (Signed)
  Subjective:    Patient ID: Jody [name], female    DOB: April 03, 1971, 40 y.o.   MRN: [id number]  HPI Pt here for f/u from hospital for visual disturbances and migraines.  See hospital D/C summary. Pt still c/o of visual disturbances and migraines 2-3 x a week.  Pt is under a lot more stress recently.  Review of Systems See above     Objective:   Physical Exam  Constitutional: She is oriented to person, place, and time. She appears well-developed and well-nourished. No distress.  HENT:  Head: Normocephalic and atraumatic.  Right Ear: External ear normal.  Left Ear: External ear normal.  Nose: Nose normal.  Mouth/Throat: No oropharyngeal exudate.  Eyes: Conjunctivae and EOM are normal. Pupils are equal, round, and reactive to light.  Neck: Normal range of motion. Neck supple.  Cardiovascular: Normal rate, regular rhythm and normal heart sounds.   No murmur heard. Pulmonary/Chest: Effort normal. No respiratory distress. She has no wheezes. She has no rales. She exhibits no tenderness.  Musculoskeletal: Normal range of motion.  Neurological: She is alert and oriented to person, place, and time.  Psychiatric: She has a normal mood and affect. Thought content normal.          Assessment & Plan:

## 2010-12-15 NOTE — Assessment & Plan Note (Signed)
Start topamax rto 2-3 weeks or sooner prn

## 2010-12-15 NOTE — Patient Instructions (Addendum)
Take topamax as directed. Call here before you run out for new rx

## 2010-12-16 NOTE — Consult Note (Signed)
[name], [name]             ACCOUNT NO.:  [id number]  MEDICAL RECORD NO.:  [id number]           PATIENT TYPE:  O  LOCATION:  1436                         FACILITY:  [facility]  PHYSICIAN:  Thana Farr, MD    DATE OF BIRTH:  Jul 23, 1971  DATE OF CONSULTATION:  12/11/2010 DATE OF DISCHARGE:                                CONSULTATION   HISTORY:  Ms. Jody [name] is a 40 year old female that presents with complaints of headache and blurred vision.  The patient reports that she has had headaches since the age of 26.  They have taken on multiple different characteristics during these times.  Over the past 2 to 3 weeks though she has had a different presentation.  She describes having a headache that was mostly occipital and on the right.  The headache is associated with nausea and photophobia.  With the onset of the headache, the patient also has blurred vision.  She reports that initially with these episodes the blurred vision would last somewhere between 10 to 15 minutes and resolve.  The headache would outlast the visual complaints. She reports that on yesterday though she had an episode while at work that lasted approximately 30 minutes, which was longer than they had in the past.  Again the headache did outlast the visual complaints.  The patient reports that these episodes are occurring approximately 2 to 3 times a week.  The patient does wear glasses and contacts.  Most of these episodes are occurring at work and usually towards the middle or end of the day.  She does work in front of a [employer].  She does admit to having quite a bit of increased stress recently.  Also admits to poor sleep recently.  PAST MEDICAL HISTORY:  As above.  Obesity.  MEDICATIONS:  None at home.  Currently on aspirin.  SOCIAL HISTORY:  The patient has no history of alcohol, tobacco or illicit drug abuse.  PHYSICAL EXAMINATION:  VITAL SIGNS:  Blood pressure 142/98, heart rate 86, respiratory  rate 18, temperature 98.4. NEUROLOGIC:  On mental status testing, the patient is alert and oriented.  She can follow commands without difficulty.  Her speech is fluent without evidence of aphasia.  CRANIAL NERVE TESTING:  II:  Disk flat bilaterally.  Visual fields grossly intact.  III, IV, VI: Extraocular movements intact.  V and VII:  Smile symmetric.  VIII: Grossly intact.  IX and VII:  Positive gag.  XI:  Bilateral shoulder shrug.  XII:  Midline tongue extension.  MOTOR EXAMINATION:  The patient is 5/5 throughout.  There is normal tone and bulk.  SENSORY:  Pinprick and light touch are intact bilaterally.  Deep tendon reflexes are 1+ throughout with absent ankle jerks.  Plantars are downgoing bilaterally. CEREBELLAR TESTING:  Finger-to-nose and heel-to-shin intact.  LABORATORY DATA:  Shows a white blood cell count of 6.0, platelet count 291, hemoglobin and hematocrit 12.4 and 39.4 respectively.  Sodium 137, potassium 4.1, chloride 105, bicarbonate 25, BUN and creatinine 9 and 0.80 respectively.  Glucose 83, calcium 9.1, protein 7.7, albumin 3.5, SGOT and SGPT 24 and 13 respectively.  Alcohol level less  than 5.  UA unremarkable.  PT, INR and PTT 13.7, 1.03 and 28 respectively.  CK 223.  RADIOLOGIC:  CT of the head unremarkable.  This was reviewed today.  ASSESSMENT:  Jody [name] is a 40 year old female that presents with headache and visual disturbance that by description are quite typical for migraine and exacerbated by the current phenomenon that are typically seen in exacerbation of migraine.  Would like to rule out other possibilities though.  The patient does not have any disk engorgement at this time to suggest a benign intracranial hypertension. We will rule out any intracranial abnormalities and metabolic abnormalities as well.  PLAN: 1. The patient to have MRI of the brain.  This may need to be     performed as an outpatient due to the patient's size. 2. B12, vitamin D and  TSH to be performed.          ______________________________ Thana Farr, MD     LR/MEDQ  D:  12/11/2010  T:  12/12/2010  Job:  [id number]  Electronically Signed by Thana Farr MD on 12/16/2010 06:20:04 PM

## 2010-12-17 NOTE — Progress Notes (Signed)
Letter mailed     KP 

## 2010-12-18 NOTE — Discharge Summary (Signed)
Jody [name], Jody [name]             ACCOUNT NO.:  [id number]  MEDICAL RECORD NO.:  [id number]           PATIENT TYPE:  O  LOCATION:  1436                         FACILITY:  [facility]  PHYSICIAN:  Peggye Pitt, M.D. DATE OF BIRTH:  November 27, 1970  DATE OF ADMISSION:  12/11/2010 DATE OF DISCHARGE:  12/12/2010                              DISCHARGE SUMMARY   PRIMARY CARE PHYSICIAN:  Lelon Perla, DO  DISCHARGE DIAGNOSES: 1. Visual disturbances secondary to migraine headache. 2. Vitamin B12 deficiency.  DISCHARGE MEDICATIONS: 1. Ibuprofen 200 mg every 8 hours as needed for pain or headache. 2. Vitamin B12 1000 mcg daily for 6 days weekly for 4 weeks and then     monthly thereafter.  CONSULTATION DURING THIS HOSPITALIZATION:  Thana Farr, MD, with Neurology  IMAGES AND PROCEDURES:  During this hospitalization include an MRI, MRA of the brain on March 30 that showed no evidence of acute ischemia. Mild inflammatory mucosal thickening of the paranasal sinuses.  HISTORY AND PHYSICAL:  For complete details, please refer to dictation by Dr. Brien Few on March 29 but in brief [name] is a pleasant 40 year old obese African American lady who has a history of migraine headaches since the age of 82, who presented to the hospital on March 29 for visual disturbances while staring at her computer.  She states that recently she has been under a lot of stress caring for her 3 of children school as well as an elderly father who has cancer.  She has not been sleeping well.  Because her visual disturbances were new to her in that the presentation was longer, she decided to seek evaluation in the hospital.  HOSPITAL COURSE: 1. Visual disturbances.  Neurology has seen her in consultation.  They     and I believe that this is related to her migraine headaches.     Incidentally, she was found to have vitamin B12 deficiency.  I am     unaware if the B12 deficiency is playing any role in her  visual     disturbances and migraines, although it is certainly possible as     B12 is necessary for nerve conduction.  See below for details.  She     has had no recurrence of visual disturbance or headache while in     the hospital.  We believe it is safe to discharge her home today. 2. Vitamin B12 deficiency.  We will start her on IM repletion on     schedule as above.  She will get a first shot here in the hospital     and case management will arrange for a home health RN to come out     to her house for the first couple of days to make sure that she is     comfortable with this administration.  VITALS ON DAY OF DISCHARGE:  Blood pressure 112/75, heart rate 81, respirations 18, sats 99% on room air, and temperature 98.1.     Peggye Pitt, M.D.     EH/MEDQ  D:  12/12/2010  T:  12/12/2010  Job:  [id number]  cc:   Myrene Buddy  Marjory Lies, DO [address] [place], [place] [address]  Thana Farr, MD Fax: [phone]  Electronically Signed by Peggye Pitt M.D. on 12/18/2010 08:23:59 PM

## 2010-12-19 ENCOUNTER — Encounter: Payer: Self-pay | Admitting: Family Medicine

## 2010-12-25 ENCOUNTER — Telehealth: Payer: Self-pay | Admitting: Family Medicine

## 2010-12-25 NOTE — Telephone Encounter (Signed)
Advise Dr Laury Axon Pt has pending appt on 12-26-10 @3pm .

## 2010-12-25 NOTE — Telephone Encounter (Signed)
Spoke with Merriane about pt.  She feels she is very depressed and needs medication as well as another 2-4 weeks off work.  She thinks pt has appointment here this week.   Please look to see that she had an appointment this week or early next week.  [employer]

## 2010-12-26 ENCOUNTER — Ambulatory Visit (INDEPENDENT_AMBULATORY_CARE_PROVIDER_SITE_OTHER): Payer: BC Managed Care – PPO | Admitting: Family Medicine

## 2010-12-26 ENCOUNTER — Encounter: Payer: Self-pay | Admitting: Family Medicine

## 2010-12-26 VITALS — BP 126/90 | HR 80 | Wt 299.6 lb

## 2010-12-26 DIAGNOSIS — F329 Major depressive disorder, single episode, unspecified: Secondary | ICD-10-CM

## 2010-12-26 DIAGNOSIS — F32A Depression, unspecified: Secondary | ICD-10-CM | POA: Insufficient documentation

## 2010-12-26 MED ORDER — FLUOXETINE HCL 20 MG PO CAPS: 20.0000 mg | ORAL_CAPSULE | Freq: Every day | ORAL | Status: DC

## 2010-12-26 NOTE — Assessment & Plan Note (Signed)
prozac 20 mg 1 po qd  rto 2 weeks

## 2010-12-26 NOTE — Patient Instructions (Signed)
Start prozac 20 mg  1 a day con't with counselor

## 2010-12-26 NOTE — Progress Notes (Signed)
  Subjective:    Patient ID: Jody [name], female    DOB: 09-19-1970, 40 y.o.   MRN: [id number]  HPI Pt here at the request of her therapist for depression.  She also felt the pt needed another 2-4 weeks off.  Pt has never been on medication before.      Review of Systems As above    Objective:   Physical Exam  Constitutional: She appears well-developed and well-nourished.  Psychiatric: Her speech is normal and behavior is normal. Judgment and thought content normal. Her mood appears not anxious. Her affect is blunt. Her affect is not angry, not labile and not inappropriate. Cognition and memory are normal. She exhibits a depressed mood.          Assessment & Plan:

## 2010-12-30 ENCOUNTER — Other Ambulatory Visit: Payer: Self-pay | Admitting: Family Medicine

## 2010-12-30 DIAGNOSIS — Z1231 Encounter for screening mammogram for malignant neoplasm of breast: Secondary | ICD-10-CM

## 2011-01-09 ENCOUNTER — Ambulatory Visit: Payer: BC Managed Care – PPO | Admitting: Family Medicine

## 2011-01-13 ENCOUNTER — Encounter: Payer: Self-pay | Admitting: Family Medicine

## 2011-01-13 ENCOUNTER — Ambulatory Visit (INDEPENDENT_AMBULATORY_CARE_PROVIDER_SITE_OTHER): Payer: BC Managed Care – PPO | Admitting: Family Medicine

## 2011-01-13 VITALS — BP 140/82 | HR 72 | Wt 297.4 lb

## 2011-01-13 DIAGNOSIS — E538 Deficiency of other specified B group vitamins: Secondary | ICD-10-CM

## 2011-01-13 DIAGNOSIS — F32A Depression, unspecified: Secondary | ICD-10-CM

## 2011-01-13 DIAGNOSIS — G43909 Migraine, unspecified, not intractable, without status migrainosus: Secondary | ICD-10-CM

## 2011-01-13 DIAGNOSIS — F341 Dysthymic disorder: Secondary | ICD-10-CM

## 2011-01-13 DIAGNOSIS — F329 Major depressive disorder, single episode, unspecified: Secondary | ICD-10-CM

## 2011-01-13 LAB — CBC WITH DIFFERENTIAL/PLATELET
Basophils Relative: 0.3 % (ref 0.0–3.0)
Eosinophils Relative: 2.8 % (ref 0.0–5.0)
Lymphocytes Relative: 44.5 % (ref 12.0–46.0)
Monocytes Relative: 8.2 % (ref 3.0–12.0)
Neutrophils Relative %: 44.2 % (ref 43.0–77.0)
RBC: 4.28 Mil/uL (ref 3.87–5.11)
WBC: 6.1 10*3/uL (ref 4.5–10.5)

## 2011-01-13 MED ORDER — TOPIRAMATE 50 MG PO TABS: 50.0000 mg | ORAL_TABLET | Freq: Two times a day (BID) | ORAL | Status: DC

## 2011-01-13 NOTE — Assessment & Plan Note (Signed)
Check labs today Pt getting b12 weekly

## 2011-01-13 NOTE — Patient Instructions (Signed)
Anxiety and Panic Attacks Your caregiver has informed you that you are having an anxiety or panic attack. There may be many forms of this. Most of the time these attacks come suddenly and without warning. They come at any time of day, including periods of sleep, and at any time of life. They may be strong and unexplained. Although panic attacks are very scary, they are physically harmless. Sometimes the cause of your anxiety is not known. Anxiety is a protective mechanism of the body in its fight or flight mechanism. Most of these perceived danger situations are actually nonphysical situations (such as anxiety over losing a job). CAUSES The causes of an anxiety or panic attack are many. Panic attacks may occur in otherwise healthy people given a certain set of circumstances. There may be a genetic cause for panic attacks. Some medications may also have anxiety as a side effect. SYMPTOMS Some of the most common feelings are:  Intense terror.  Dizziness, feeling faint.   Hot and cold flashes.   Fear of going crazy.   Feelings that nothing is real.   Sweating.   Shaking.   Chest pain or a fast heartbeat (palpitations).  Smothering, choking sensations.   Feelings of impending doom and that death is near.   Tingling of extremities, this may be from over breathing.   Altered reality (derealization).   Being detached from yourself (depersonalization).   Several symptoms can be present to make up anxiety or panic attacks. DIAGNOSIS The evaluation by your caregiver will depend on the type of symptoms you are experiencing. The diagnosis of anxiety or pain attack is made when no physical illness can be determined to be a cause of the symptoms. TREATMENT Treatment to prevent anxiety and panic attacks may include:  Avoidance of circumstances that cause anxiety.   Reassurance and relaxation.   Regular exercise.   Relaxation therapies, such as yoga.   Psychotherapy with a psychiatrist  or therapist.   Avoidance of caffeine, alcohol and illegal drugs.   Prescribed medication.  SEEK IMMEDIATE MEDICAL CARE IF:  You experience panic attack symptoms that are different than your usual symptoms.   You have any worsening or concerning symptoms.  Document Released: 08/31/2005 Document Re-Released: 02/18/2010 [employer] Patient Information 2011 [employer]. 

## 2011-01-13 NOTE — Progress Notes (Signed)
  Subjective:    Jody [name] is a 40 y.o. female who presents for follow-up of migraine headaches. Home treatment has included topamax with marked improvement. Headaches are occurring rarely now.  She has not had one since starting topamax. Generally, the headaches last about NA . Work attendance or other daily activities are not affected by the headaches. The patient denies depression/anxiety. The following portions of the patient's history were reviewed and updated as appropriate: allergies, current medications, past family history, past medical history, past social history, past surgical history and problem list.  Review of Systems Pertinent items are noted in HPI.    Objective:    BP 140/82  Pulse 72  Wt 297 lb 6.4 oz (134.9 kg) General appearance: alert, cooperative, appears stated age and no distress Head: Normocephalic, without obvious abnormality, atraumatic Eyes: conjunctivae/corneas clear. PERRL, EOM's intact. Fundi benign. Heart: regular rate and rhythm, S1, S2 normal, no murmur, click, rub or gallop Neurologic: Alert and oriented X 3, normal strength and tone. Normal symmetric reflexes. Normal coordination and gait psych--- flat affect,  Depressed   no suicidal ideation   Assessment:    Common migraine   depression/ anxiety Plan:    Continue present treatment and plan. Side effect profile discussed in detail. Follow up in 3 months. f/u psych as recommended by counselor

## 2011-01-14 ENCOUNTER — Encounter: Payer: Self-pay | Admitting: *Deleted

## 2011-01-15 ENCOUNTER — Telehealth: Payer: Self-pay | Admitting: Family Medicine

## 2011-01-15 NOTE — Telephone Encounter (Signed)
ok 

## 2011-01-15 NOTE — Telephone Encounter (Signed)
Sharyl Nimrod called saying the pt had significant cognitive impairment and she wondered if it was the topamax.   We need to wean pt back off topamax----   Go back down to 1, 25mg   in am and 2 in pm for few days then ---  2mg  bid for 1 week then 25mg  in pm for 1 week then stop

## 2011-01-15 NOTE — Telephone Encounter (Signed)
Left message to call back     KP 

## 2011-01-16 NOTE — Telephone Encounter (Signed)
Left message to call back     KP 

## 2011-01-21 ENCOUNTER — Telehealth: Payer: Self-pay | Admitting: *Deleted

## 2011-01-21 NOTE — Telephone Encounter (Signed)
Left message advising patient paperwork has been refaxed    KP

## 2011-01-21 NOTE — Telephone Encounter (Signed)
Pt calling to check status of paperwork. Please advise  °

## 2011-01-21 NOTE — Telephone Encounter (Signed)
I though I recently did paperwork.  I haven't got anything recently.

## 2011-01-30 NOTE — Op Note (Signed)
NAME:  KAYCIE, PEGUES                       ACCOUNT NO.:  [id number]   MEDICAL RECORD NO.:  [id number]                   PATIENT TYPE:  OBV   LOCATION:  0447                                 FACILITY:  [facility]   PHYSICIAN:  Adolph Pollack, M.D.            DATE OF BIRTH:  08-01-1971   DATE OF PROCEDURE:  07/24/2003  DATE OF DISCHARGE:                                 OPERATIVE REPORT   PREOPERATIVE DIAGNOSES:  1. Biliary colic.  2. Symptomatic cholelithiasis.   POSTOPERATIVE DIAGNOSES:  1. Biliary colic.  2. Symptomatic cholelithiasis.   PROCEDURES:  1. Laparoscopic cholecystectomy.  2. Intraoperative cholangiogram.   SURGEON:  Adolph Pollack, M.D.   ASSISTANT:  [name], M.D.   ANESTHESIA:  General.   INDICATION:  Ms. Sliva is a 40 year old female, whom I saw back in the  emergency room about two weeks ago.  She had a severe case of biliary colic  at that time but did not appear to have active infection.  I discussed with  her putting her in the hospital and performing a cholecystectomy, but she  really wanted to try to go home if possible.  I placed her on oral  ciprofloxacin and Vicodin, and she called back to schedule surgery for  today.  The procedure and the risks included but not limited to bleeding,  infection, common bile duct injury, hepatic injury, bile leak, small  intestinal injury, general anesthesia, and postcholecystectomy diarrhea were  explained to her.   DESCRIPTION OF PROCEDURE:  She is seen in the holding room and then brought  to the operating room, placed supine on the operating table, and a general  anesthetic was administered.  She had a very obese abdominal wall, and this  was carefully sterilely prepped and draped.  Local anesthetic was  infiltrated in the subumbilical region, and a small subumbilical incision  was made through the skin and subcutaneous tissue until we were able to  define the fascia.   A small incision was  made in the fascia, and underlying peritoneum was  identified and a small incision made in it, and the peritoneal cavity was  entered under direct vision.  A pursestring suture of #0 Vicryl was placed  around the fascial edges.  A Hasson trocar was introduced into the  peritoneal cavity, and a pneumoperitoneum was created by insufflation of CO2  gas.   Next, the laparoscope was introduced.  Under direct vision, an 11 mm trocar  was placed through an epigastric incision and two 5 mm trocars were placed  to the right and mid abdomen.  She was then placed in reverse Trendelenburg  position with the right side tilted slightly up.  Fundus of the gallbladder  was grasped.  The gallbladder had an acute angle to it where the  infundibulum was, and this was grasped, freed up from some adhesions between  the omentum and the liver.  The fundus  was then retracted toward the right  shoulder and infundibulum retracted laterally.   Using careful blunt dissection, I was able to isolate the cystic duct.  I  created a window around it.  I placed a clip at the cystic duct gallbladder  junction and made a small incision in the cystic duct.  A cholangiocatheter  was passed through the anterior abdominal wall and placed into the cystic  duct, and the cholangiogram was performed.   Under real-time fluoroscopy, dilute contrast material was injected into the  cystic duct.  The common bile duct filled fairly promptly and drained  without obvious defect.  The cystic duct appeared to be going directly into  the right hepatic duct, as we traced the right hepatic duct up proximally to  the liver and down to the bifurcation, and then the left hepatic duct came  up from that.  I repeated the cholangiogram and had Dr. Stevphen Meuse look at  it, and he agreed that the cystic duct appeared to be coming off the right  hepatic duct.   At this point, the cholangiocatheter was removed.  The cystic duct was then  clipped three  times on the staying inside and divided.  An anterior branch  and a posterior branch of the cystic artery were identified, clipped, and  divided.  The gallbladder was then dissected free from the liver using  electrocautery.  Once small puncture wound was made by a retractor, and a  minimal bile spillage was noted.  The gallbladder was then placed in an  Endopouch bag.   The gallbladder fossa was then irrigated.  Bleeding points were controlled  with the cautery.  I then copiously irrigated it once again.  I saw no  further bleeding and saw no bile leak.  I evacuated the irrigation fluid.  I  then removed the gallbladder through the subumbilical port in the Endopouch  bag.  She had multiple gallstones noted.   Under laparoscopic vision, the fascial defect was closed by tightening up  and tying down the pursestring suture.  The remaining perihepatic fluid was  evacuated, and the trocars were removed, and the pneumoperitoneum was  released.  The skin incisions were closed with 4-0 Monocryl subcuticular  stitches.  Steri-Strips and sterile dressings were applied.   She tolerated the procedure well without any apparent complications and was  taken to the recovery room in satisfactory condition.                                               Adolph Pollack, M.D.    Kari Baars  D:  07/24/2003  T:  07/24/2003  Job:  [id number]

## 2011-01-30 NOTE — H&P (Signed)
NAMECORTASIA, SCREWS                         ACCOUNT NO.:  [id number]   MEDICAL RECORD NO.:  [id number]                   PATIENT TYPE:  EMS   LOCATION:  ED                                   [facility]ACILITY:  [facility]   PHYSICIAN:  Adolph Pollack, M.D.            DATE OF BIRTH:  March 25, 1971   DATE OF ADMISSION:  07/10/2003  DATE OF DISCHARGE:                                HISTORY & PHYSICAL   CHIEF COMPLAINT:  Epigastric abdominal pain with nausea and vomiting.   HISTORY OF PRESENT ILLNESS:  Ms. Bradshaw is a 40 year old female with the  sudden onset of pressure-type epigastric pain at about 11:00 or 11:30 a.m.  She continued to work, but had an episode of nausea and vomiting.  The pain  persisted, and she presented to the [facility] emergency department.  By  the time she was here, she said the pain had been starting to ease off some.  She denied any pain like this before.  Denies any known gallbladder or  peptic ulcer disease.  While in the emergency department, she was given some  Dilaudid with relief of the pain, and some Phenergan with relief of the  nausea.   PAST MEDICAL HISTORY:  No chronic illnesses.   PREVIOUS OPERATIONS:  None.   ALLERGIES:  None.   MEDICATIONS:  None.   SOCIAL HISTORY:  She is a single mother and employed.  No tobacco or alcohol  use.   FAMILY HISTORY:  Positive for gallbladder disease.   REVIEW OF SYSTEMS:  CARDIOVASCULAR:  No known heart disease or hypertension.  PULMONARY:  No asthma or pneumonia.  GI:  She says she may have had an ulcer  in the past.  No hepatitis.  GU:  No kidney stones.  ENDOCRINE:  No  diabetes.  NEUROLOGIC:  No seizures.  HEMATOLOGIC:  No bleeding disorders or  blood clots.   PHYSICAL EXAMINATION:  GENERAL:  A morbidly obese female somewhat sedated.  VITAL SIGNS:  Temperature is 99.9, blood pressure 120/76, pulse 80.  HEENT:  Eyes - extraocular motions intact.  No icterus.  NECK:  Thick and supple without obvious mass or  obvious thyroid enlargement.  CARDIOVASCULAR:  Heart demonstrates a regular rate and rhythm without a  murmur.  RESPIRATORY:  Breath sounds equal and clear.  Respirations unlabored.  ABDOMEN:  Soft and obese, with mild to moderate epigastric and right upper  quadrant tenderness with no guarding present.  No masses.  A few bowel  sounds noted.  BACK:  No costovertebral angle tenderness.  EXTREMITIES:  No cyanosis or edema.   LABORATORY DATA:  White cell count is 10,200 with no left shift (within  normal limits).  Her hemoglobin was 14.7.  Lipase 23.  Urinalysis negative.  Urine pregnancy negative.  Total bilirubin and alkaline phosphatase are  normal.  SGOT is elevated to 417, SGPT 187.   Abdominal ultrasound demonstrates gallstones.  No  pericholecystic fluid.  Borderline gallbladder wall thickening.   IMPRESSION:  Acute biliary colic secondary to gallstones - possible early  acute cholecystitis, although she does not have elevation of her white blood  cell count, and the physical examination does not demonstrate any obvious  peritonitis.   PLAN:  I have discussed with her admitting to the hospital, empirically  starting on IV antibiotics, and planning for a cholecystectomy in the next  day or two.  She is hesitant to do this.  The other option discussed with  her was the possibility of going home on oral antibiotics, and scheduling  her operation at a later date; however, I told her if she started running  fever or becoming more ill, then I would definitely want to know it.  We  would have to schedule the operation sooner.  She really wants to go home.   PLAN:  Discharge her on oral Cipro and some Vicodin.  Will call her on  July 11, 2003 in the a.m. to check on her.                                               Adolph Pollack, M.D.    Kari Baars  D:  07/11/2003  T:  07/11/2003  Job:  [id number]

## 2011-02-10 ENCOUNTER — Ambulatory Visit: Payer: BC Managed Care – PPO

## 2011-12-31 ENCOUNTER — Encounter: Payer: Self-pay | Admitting: Family Medicine

## 2011-12-31 ENCOUNTER — Ambulatory Visit (INDEPENDENT_AMBULATORY_CARE_PROVIDER_SITE_OTHER): Payer: BC Managed Care – PPO | Admitting: Family Medicine

## 2011-12-31 VITALS — BP 135/80 | HR 110 | Temp 98.9°F | Ht 65.0 in | Wt 307.8 lb

## 2011-12-31 DIAGNOSIS — R5381 Other malaise: Secondary | ICD-10-CM

## 2011-12-31 DIAGNOSIS — R1013 Epigastric pain: Secondary | ICD-10-CM | POA: Insufficient documentation

## 2011-12-31 DIAGNOSIS — R5383 Other fatigue: Secondary | ICD-10-CM

## 2011-12-31 LAB — CBC WITH DIFFERENTIAL/PLATELET
Basophils Relative: 0.4 % (ref 0.0–3.0)
Eosinophils Relative: 1.9 % (ref 0.0–5.0)
HCT: 35.3 % — ABNORMAL LOW (ref 36.0–46.0)
Hemoglobin: 11.4 g/dL — ABNORMAL LOW (ref 12.0–15.0)
Lymphs Abs: 2.9 10*3/uL (ref 0.7–4.0)
MCV: 82.2 fl (ref 78.0–100.0)
Monocytes Absolute: 0.5 10*3/uL (ref 0.1–1.0)
Neutrophils Relative %: 47.2 % (ref 43.0–77.0)
RBC: 4.3 Mil/uL (ref 3.87–5.11)
WBC: 6.6 10*3/uL (ref 4.5–10.5)

## 2011-12-31 LAB — TSH: TSH: 0.64 u[IU]/mL (ref 0.35–5.50)

## 2011-12-31 LAB — HEPATIC FUNCTION PANEL
ALT: 12 U/L (ref 0–35)
AST: 17 U/L (ref 0–37)
Albumin: 3.5 g/dL (ref 3.5–5.2)

## 2011-12-31 LAB — BASIC METABOLIC PANEL
BUN: 8 mg/dL (ref 6–23)
GFR: 116.11 mL/min (ref >60.00)
Glucose, Bld: 118 mg/dL — ABNORMAL HIGH (ref 70–99)
Potassium: 3.5 mEq/L (ref 3.5–5.1)

## 2011-12-31 LAB — VITAMIN B12: Vitamin B-12: 747 pg/mL (ref 211–911)

## 2011-12-31 MED ORDER — ESOMEPRAZOLE MAGNESIUM 40 MG PO CPDR: 40.0000 mg | DELAYED_RELEASE_CAPSULE | Freq: Every day | ORAL | Status: DC

## 2011-12-31 NOTE — Patient Instructions (Signed)
We'll notify you of your lab results Start the Nexium daily If your symptoms change or worsen- please call Hang in there!!!

## 2011-12-31 NOTE — Progress Notes (Signed)
  Subjective:    Patient ID: Jody [name], female    DOB: 04/22/1971, 41 y.o.   MRN: [id number]  HPI Fatigue- 'it's been bothering me for awhile'.  Last yr was dx'd w/ B12 deficiency.  Reports increased HAs despite taking Topamax.  Not sleeping well at night.  Recently started Trazodone for sleep.  Having difficulty both falling and staying asleep.  Med was started by psych, Valinda Hoar.  Uncertain if she snores at night.  No family or personal hx of thyroid problems.  Abdominal pain- hx of PUD, has already had gallbladder removed.  sxs started 'a couple of weeks ago'.  Much more noticeable at night.  sxs radiate up into chest.  Pain described as a 'cramp', centralized.  + nausea, no vomiting.  No diarrhea/constipation.  No fevers.   Review of Systems For ROS see HPI     Objective:   Physical Exam  Vitals reviewed. Constitutional: She is oriented to person, place, and time. She appears well-developed and well-nourished. No distress.  HENT:  Head: Normocephalic and atraumatic.  Eyes: Conjunctivae and EOM are normal. Pupils are equal, round, and reactive to light.  Neck: Normal range of motion. Neck supple. No thyromegaly present.  Cardiovascular: Normal rate, regular rhythm, normal heart sounds and intact distal pulses.   No murmur heard. Pulmonary/Chest: Effort normal and breath sounds normal. No respiratory distress.  Abdominal: Soft. She exhibits no distension. There is no tenderness. There is no rebound and no guarding.  Musculoskeletal: She exhibits no edema.  Lymphadenopathy:    She has no cervical adenopathy.  Neurological: She is alert and oriented to person, place, and time.  Skin: Skin is warm and dry.  Psychiatric: She has a normal mood and affect. Her behavior is normal.          Assessment & Plan:

## 2012-01-03 NOTE — Assessment & Plan Note (Signed)
New.  Most likely due to pt's poor sleep.  Seeing psych for this.  Check TSH, CBC, B12 to r/o metabolic causes.

## 2012-01-03 NOTE — Assessment & Plan Note (Signed)
New.  Most likely GERD.  Start PPI.  Reviewed lifestyle modifications.  Check labs to r/o possible causes.  If no improvement- refer to GI.  Pt expressed understanding and is in agreement w/ plan.

## 2012-01-05 ENCOUNTER — Encounter: Payer: Self-pay | Admitting: *Deleted

## 2012-01-05 ENCOUNTER — Ambulatory Visit
Admission: RE | Admit: 2012-01-05 | Discharge: 2012-01-05 | Disposition: A | Discharge status: Home / Self Care | Payer: BC Managed Care – PPO | Source: Ambulatory Visit | Attending: Family Medicine | Admitting: Family Medicine

## 2012-01-05 DIAGNOSIS — Z1231 Encounter for screening mammogram for malignant neoplasm of breast: Secondary | ICD-10-CM

## 2012-01-05 LAB — VITAMIN D 1,25 DIHYDROXY
Vitamin D 1, 25 (OH)2 Total: 49 pg/mL (ref 18–72)
Vitamin D2 1, 25 (OH)2: 8 pg/mL
Vitamin D3 1, 25 (OH)2: 41 pg/mL

## 2012-01-15 ENCOUNTER — Other Ambulatory Visit: Payer: Self-pay | Admitting: Internal Medicine

## 2012-01-29 ENCOUNTER — Other Ambulatory Visit (HOSPITAL_COMMUNITY)
Admission: RE | Admit: 2012-01-29 | Discharge: 2012-01-29 | Disposition: A | Discharge status: Home / Self Care | Payer: BC Managed Care – PPO | Source: Ambulatory Visit | Attending: Family Medicine | Admitting: Family Medicine

## 2012-01-29 ENCOUNTER — Encounter: Payer: Self-pay | Admitting: Gastroenterology

## 2012-01-29 ENCOUNTER — Encounter: Payer: Self-pay | Admitting: Family Medicine

## 2012-01-29 ENCOUNTER — Ambulatory Visit (INDEPENDENT_AMBULATORY_CARE_PROVIDER_SITE_OTHER): Payer: BC Managed Care – PPO | Admitting: Family Medicine

## 2012-01-29 VITALS — BP 120/68 | HR 93 | Temp 98.6°F | Ht 65.0 in | Wt 307.0 lb

## 2012-01-29 DIAGNOSIS — Z01419 Encounter for gynecological examination (general) (routine) without abnormal findings: Secondary | ICD-10-CM | POA: Insufficient documentation

## 2012-01-29 DIAGNOSIS — Z124 Encounter for screening for malignant neoplasm of cervix: Secondary | ICD-10-CM

## 2012-01-29 DIAGNOSIS — E538 Deficiency of other specified B group vitamins: Secondary | ICD-10-CM

## 2012-01-29 DIAGNOSIS — K649 Unspecified hemorrhoids: Secondary | ICD-10-CM

## 2012-01-29 DIAGNOSIS — Z0000 Encounter for general adult medical examination without abnormal findings: Secondary | ICD-10-CM

## 2012-01-29 MED ORDER — HYDROCORTISONE ACETATE 25 MG RE SUPP: 25.0000 mg | Freq: Two times a day (BID) | RECTAL | Status: AC

## 2012-01-29 MED ORDER — HYDROCORTISONE 2.5 % RE CREA: TOPICAL_CREAM | Freq: Two times a day (BID) | RECTAL | Status: AC

## 2012-01-29 NOTE — Progress Notes (Signed)
Subjective:     Jody [name] is a 41 y.o. female and is here for a comprehensive physical exam. The patient reports no problems.  History   Social History  . Marital Status: Single    Spouse Name: N/A    Number of Children: N/A  . Years of Education: N/A   Occupational History  . disability    Social History Main Topics  . Smoking status: Never Smoker   . Smokeless tobacco: Never Used  . Alcohol Use: No  . Drug Use: No  . Sexually Active: Yes -- Female partner(s)   Other Topics Concern  . Not on file   Social History Narrative  . No narrative on file   Health Maintenance  Topic Date Due  . Influenza Vaccine  06/14/2012  . Mammogram  01/04/2013  . Pap Smear  01/29/2015  . Tetanus/tdap  04/03/2017    The following portions of the patient's history were reviewed and updated as appropriate: allergies, current medications, past family history, past medical history, past social history, past surgical history and problem list.  Review of Systems Review of Systems  Constitutional: Negative for activity change, appetite change and fatigue.  HENT: Negative for hearing loss, congestion, tinnitus and ear discharge.  dentist q59m Eyes: Negative for visual disturbance (see optho q1y -- vision corrected to 20/20 with glasses).  Respiratory: Negative for cough, chest tightness and shortness of breath.   Cardiovascular: Negative for chest pain, palpitations and leg swelling.  Gastrointestinal: Negative for abdominal pain, diarrhea, constipation and abdominal distention.  Genitourinary: Negative for urgency, frequency, decreased urine volume and difficulty urinating.  Musculoskeletal: Negative for back pain, arthralgias and gait problem.  Skin: Negative for color change, pallor and rash.  Neurological: Negative for dizziness, light-headedness, numbness and headaches.  Hematological: Negative for adenopathy. Does not bruise/bleed easily.  Psychiatric/Behavioral: Negative for suicidal  ideas, confusion, sleep disturbance, self-injury, dysphoric mood, decreased concentration and agitation.       Objective:   BP 120/68  Pulse 93  Temp(Src) 98.6 F (37 C) (Oral)  Ht 5\' 5"  (1.651 m)  Wt 307 lb (139.254 kg)  BMI 51.09 kg/m2  SpO2 97%  LMP 01/17/2012  General Appearance:    Alert, cooperative, no distress, appears stated age  Head:    Normocephalic, without obvious abnormality, atraumatic  Eyes:    PERRL, conjunctiva/corneas clear, EOM's intact, fundi    benign, both eyes  Ears:    Normal TM's and external ear canals, both ears  Nose:   Nares normal, septum midline, mucosa normal, no drainage    or sinus tenderness  Throat:   Lips, mucosa, and tongue normal; teeth and gums normal  Neck:   Supple, symmetrical, trachea midline, no adenopathy;    thyroid:  no enlargement/tenderness/nodules; no carotid   bruit or JVD  Back:     Symmetric, no curvature, ROM normal, no CVA tenderness  Lungs:     Clear to auscultation bilaterally, respirations unlabored  Chest Wall:    No tenderness or deformity   Heart:    Regular rate and rhythm, S1 and S2 normal, no murmur, rub   or gallop  Breast Exam:    No tenderness, masses, or nipple abnormality  Abdomen:     Soft, non-tender, bowel sounds active all four quadrants,    no masses, no organomegaly  Genitalia:    Normal female without lesion, discharge or tenderness  Rectal:    Normal tone, no masses or tenderness;   guaiac negative stool  Extremities:   Extremities normal, atraumatic, no cyanosis or edema  Pulses:   2+ and symmetric all extremities  Skin:   Skin color, texture, turgor normal, no rashes or lesions  Lymph nodes:   Cervical, supraclavicular, and axillary nodes normal  Neurologic:   CNII-XII intact, normal strength, sensation and reflexes    throughout    Rectal-- + small ext hemorrhoid, heme neg stool   Assessment:    Healthy female exam.      Plan:    ghm utd Check labs See After Visit Summary for  Counseling Recommendations

## 2012-01-29 NOTE — Patient Instructions (Signed)
Preventive Care for Adults, Female A healthy lifestyle and preventive care can promote health and wellness. Preventive health guidelines for women include the following key practices.  A routine yearly physical is a good way to check with your caregiver about your health and preventive screening. It is a chance to share any concerns and updates on your health, and to receive a thorough exam.   Visit your dentist for a routine exam and preventive care every 6 months. Brush your teeth twice a day and floss once a day. Good oral hygiene prevents tooth decay and gum disease.   The frequency of eye exams is based on your age, health, family medical history, use of contact lenses, and other factors. Follow your caregiver's recommendations for frequency of eye exams.   Eat a healthy diet. Foods like vegetables, fruits, whole grains, low-fat dairy products, and lean protein foods contain the nutrients you need without too many calories. Decrease your intake of foods high in solid fats, added sugars, and salt. Eat the right amount of calories for you.Get information about a proper diet from your caregiver, if necessary.   Regular physical exercise is one of the most important things you can do for your health. Most adults should get at least 150 minutes of moderate-intensity exercise (any activity that increases your heart rate and causes you to sweat) each week. In addition, most adults need muscle-strengthening exercises on 2 or more days a week.   Maintain a healthy weight. The body mass index (BMI) is a screening tool to identify possible weight problems. It provides an estimate of body fat based on height and weight. Your caregiver can help determine your BMI, and can help you achieve or maintain a healthy weight.For adults 20 years and older:   A BMI below 18.5 is considered underweight.   A BMI of 18.5 to 24.9 is normal.   A BMI of 25 to 29.9 is considered overweight.   A BMI of 30 and above is  considered obese.   Maintain normal blood lipids and cholesterol levels by exercising and minimizing your intake of saturated fat. Eat a balanced diet with plenty of fruit and vegetables. Blood tests for lipids and cholesterol should begin at age 20 and be repeated every 5 years. If your lipid or cholesterol levels are high, you are over 50, or you are at high risk for heart disease, you may need your cholesterol levels checked more frequently.Ongoing high lipid and cholesterol levels should be treated with medicines if diet and exercise are not effective.   If you smoke, find out from your caregiver how to quit. If you do not use tobacco, do not start.   If you are pregnant, do not drink alcohol. If you are breastfeeding, be very cautious about drinking alcohol. If you are not pregnant and choose to drink alcohol, do not exceed 1 drink per day. One drink is considered to be 12 ounces (355 mL) of beer, 5 ounces (148 mL) of wine, or 1.5 ounces (44 mL) of liquor.   Avoid use of street drugs. Do not share needles with anyone. Ask for help if you need support or instructions about stopping the use of drugs.   High blood pressure causes heart disease and increases the risk of stroke. Your blood pressure should be checked at least every 1 to 2 years. Ongoing high blood pressure should be treated with medicines if weight loss and exercise are not effective.   If you are 55 to 39   years old, ask your caregiver if you should take aspirin to prevent strokes.   Diabetes screening involves taking a blood sample to check your fasting blood sugar level. This should be done once every 3 years, after age 45, if you are within normal weight and without risk factors for diabetes. Testing should be considered at a younger age or be carried out more frequently if you are overweight and have at least 1 risk factor for diabetes.   Breast cancer screening is essential preventive care for women. You should practice "breast  self-awareness." This means understanding the normal appearance and feel of your breasts and may include breast self-examination. Any changes detected, no matter how small, should be reported to a caregiver. Women in their 20s and 30s should have a clinical breast exam (CBE) by a caregiver as part of a regular health exam every 1 to 3 years. After age 40, women should have a CBE every year. Starting at age 40, women should consider having a mammography (breast X-ray test) every year. Women who have a family history of breast cancer should talk to their caregiver about genetic screening. Women at a high risk of breast cancer should talk to their caregivers about having magnetic resonance imaging (MRI) and a mammography every year.   The Pap test is a screening test for cervical cancer. A Pap test can show cell changes on the cervix that might become cervical cancer if left untreated. A Pap test is a procedure in which cells are obtained and examined from the lower end of the uterus (cervix).   Women should have a Pap test starting at age 21.   Between ages 21 and 29, Pap tests should be repeated every 2 years.   Beginning at age 30, you should have a Pap test every 3 years as long as the past 3 Pap tests have been normal.   Some women have medical problems that increase the chance of getting cervical cancer. Talk to your caregiver about these problems. It is especially important to talk to your caregiver if a new problem develops soon after your last Pap test. In these cases, your caregiver may recommend more frequent screening and Pap tests.   The above recommendations are the same for women who have or have not gotten the vaccine for human papillomavirus (HPV).   If you had a hysterectomy for a problem that was not cancer or a condition that could lead to cancer, then you no longer need Pap tests. Even if you no longer need a Pap test, a regular exam is a good idea to make sure no other problems are  starting.   If you are between ages 65 and 70, and you have had normal Pap tests going back 10 years, you no longer need Pap tests. Even if you no longer need a Pap test, a regular exam is a good idea to make sure no other problems are starting.   If you have had past treatment for cervical cancer or a condition that could lead to cancer, you need Pap tests and screening for cancer for at least 20 years after your treatment.   If Pap tests have been discontinued, risk factors (such as a new sexual partner) need to be reassessed to determine if screening should be resumed.   The HPV test is an additional test that may be used for cervical cancer screening. The HPV test looks for the virus that can cause the cell changes on the cervix.   The cells collected during the Pap test can be tested for HPV. The HPV test could be used to screen women aged 30 years and older, and should be used in women of any age who have unclear Pap test results. After the age of 30, women should have HPV testing at the same frequency as a Pap test.   Colorectal cancer can be detected and often prevented. Most routine colorectal cancer screening begins at the age of 50 and continues through age 75. However, your caregiver may recommend screening at an earlier age if you have risk factors for colon cancer. On a yearly basis, your caregiver may provide home test kits to check for hidden blood in the stool. Use of a small camera at the end of a tube, to directly examine the colon (sigmoidoscopy or colonoscopy), can detect the earliest forms of colorectal cancer. Talk to your caregiver about this at age 50, when routine screening begins. Direct examination of the colon should be repeated every 5 to 10 years through age 75, unless early forms of pre-cancerous polyps or small growths are found.   Hepatitis C blood testing is recommended for all people born from 1945 through 1965 and any individual with known risks for hepatitis C.    Practice safe sex. Use condoms and avoid high-risk sexual practices to reduce the spread of sexually transmitted infections (STIs). STIs include gonorrhea, chlamydia, syphilis, trichomonas, herpes, HPV, and human immunodeficiency virus (HIV). Herpes, HIV, and HPV are viral illnesses that have no cure. They can result in disability, cancer, and death. Sexually active women aged 25 and younger should be checked for chlamydia. Older women with new or multiple partners should also be tested for chlamydia. Testing for other STIs is recommended if you are sexually active and at increased risk.   Osteoporosis is a disease in which the bones lose minerals and strength with aging. This can result in serious bone fractures. The risk of osteoporosis can be identified using a bone density scan. Women ages 65 and over and women at risk for fractures or osteoporosis should discuss screening with their caregivers. Ask your caregiver whether you should take a calcium supplement or vitamin D to reduce the rate of osteoporosis.   Menopause can be associated with physical symptoms and risks. Hormone replacement therapy is available to decrease symptoms and risks. You should talk to your caregiver about whether hormone replacement therapy is right for you.   Use sunscreen with sun protection factor (SPF) of 30 or more. Apply sunscreen liberally and repeatedly throughout the day. You should seek shade when your shadow is shorter than you. Protect yourself by wearing long sleeves, pants, a wide-brimmed hat, and sunglasses year round, whenever you are outdoors.   Once a month, do a whole body skin exam, using a mirror to look at the skin on your back. Notify your caregiver of new moles, moles that have irregular borders, moles that are larger than a pencil eraser, or moles that have changed in shape or color.   Stay current with required immunizations.   Influenza. You need a dose every fall (or winter). The composition of  the flu vaccine changes each year, so being vaccinated once is not enough.   Pneumococcal polysaccharide. You need 1 to 2 doses if you smoke cigarettes or if you have certain chronic medical conditions. You need 1 dose at age 65 (or older) if you have never been vaccinated.   Tetanus, diphtheria, pertussis (Tdap, Td). Get 1 dose of   Tdap vaccine if you are younger than age 65, are over 65 and have contact with an infant, are a healthcare worker, are pregnant, or simply want to be protected from whooping cough. After that, you need a Td booster dose every 10 years. Consult your caregiver if you have not had at least 3 tetanus and diphtheria-containing shots sometime in your life or have a deep or dirty wound.   HPV. You need this vaccine if you are a woman age 26 or younger. The vaccine is given in 3 doses over 6 months.   Measles, mumps, rubella (MMR). You need at least 1 dose of MMR if you were born in 1957 or later. You may also need a second dose.   Meningococcal. If you are age 19 to 21 and a first-year college student living in a residence hall, or have one of several medical conditions, you need to get vaccinated against meningococcal disease. You may also need additional booster doses.   Zoster (shingles). If you are age 60 or older, you should get this vaccine.   Varicella (chickenpox). If you have never had chickenpox or you were vaccinated but received only 1 dose, talk to your caregiver to find out if you need this vaccine.   Hepatitis A. You need this vaccine if you have a specific risk factor for hepatitis A virus infection or you simply wish to be protected from this disease. The vaccine is usually given as 2 doses, 6 to 18 months apart.   Hepatitis B. You need this vaccine if you have a specific risk factor for hepatitis B virus infection or you simply wish to be protected from this disease. The vaccine is given in 3 doses, usually over 6 months.  Preventive Services /  Frequency Ages 19 to 39  Blood pressure check.** / Every 1 to 2 years.   Lipid and cholesterol check.** / Every 5 years beginning at age 20.   Clinical breast exam.** / Every 3 years for women in their 20s and 30s.   Pap test.** / Every 2 years from ages 21 through 29. Every 3 years starting at age 30 through age 65 or 70 with a history of 3 consecutive normal Pap tests.   HPV screening.** / Every 3 years from ages 30 through ages 65 to 70 with a history of 3 consecutive normal Pap tests.   Hepatitis C blood test.** / For any individual with known risks for hepatitis C.   Skin self-exam. / Monthly.   Influenza immunization.** / Every year.   Pneumococcal polysaccharide immunization.** / 1 to 2 doses if you smoke cigarettes or if you have certain chronic medical conditions.   Tetanus, diphtheria, pertussis (Tdap, Td) immunization. / A one-time dose of Tdap vaccine. After that, you need a Td booster dose every 10 years.   HPV immunization. / 3 doses over 6 months, if you are 26 and younger.   Measles, mumps, rubella (MMR) immunization. / You need at least 1 dose of MMR if you were born in 1957 or later. You may also need a second dose.   Meningococcal immunization. / 1 dose if you are age 19 to 21 and a first-year college student living in a residence hall, or have one of several medical conditions, you need to get vaccinated against meningococcal disease. You may also need additional booster doses.   Varicella immunization.** / Consult your caregiver.   Hepatitis A immunization.** / Consult your caregiver. 2 doses, 6 to 18 months   apart.   Hepatitis B immunization.** / Consult your caregiver. 3 doses usually over 6 months.  Ages 40 to 64  Blood pressure check.** / Every 1 to 2 years.   Lipid and cholesterol check.** / Every 5 years beginning at age 20.   Clinical breast exam.** / Every year after age 40.   Mammogram.** / Every year beginning at age 40 and continuing for as  long as you are in good health. Consult with your caregiver.   Pap test.** / Every 3 years starting at age 30 through age 65 or 70 with a history of 3 consecutive normal Pap tests.   HPV screening.** / Every 3 years from ages 30 through ages 65 to 70 with a history of 3 consecutive normal Pap tests.   Fecal occult blood test (FOBT) of stool. / Every year beginning at age 50 and continuing until age 75. You may not need to do this test if you get a colonoscopy every 10 years.   Flexible sigmoidoscopy or colonoscopy.** / Every 5 years for a flexible sigmoidoscopy or every 10 years for a colonoscopy beginning at age 50 and continuing until age 75.   Hepatitis C blood test.** / For all people born from 1945 through 1965 and any individual with known risks for hepatitis C.   Skin self-exam. / Monthly.   Influenza immunization.** / Every year.   Pneumococcal polysaccharide immunization.** / 1 to 2 doses if you smoke cigarettes or if you have certain chronic medical conditions.   Tetanus, diphtheria, pertussis (Tdap, Td) immunization.** / A one-time dose of Tdap vaccine. After that, you need a Td booster dose every 10 years.   Measles, mumps, rubella (MMR) immunization. / You need at least 1 dose of MMR if you were born in 1957 or later. You may also need a second dose.   Varicella immunization.** / Consult your caregiver.   Meningococcal immunization.** / Consult your caregiver.   Hepatitis A immunization.** / Consult your caregiver. 2 doses, 6 to 18 months apart.   Hepatitis B immunization.** / Consult your caregiver. 3 doses, usually over 6 months.  Ages 65 and over  Blood pressure check.** / Every 1 to 2 years.   Lipid and cholesterol check.** / Every 5 years beginning at age 20.   Clinical breast exam.** / Every year after age 40.   Mammogram.** / Every year beginning at age 40 and continuing for as long as you are in good health. Consult with your caregiver.   Pap test.** /  Every 3 years starting at age 30 through age 65 or 70 with a 3 consecutive normal Pap tests. Testing can be stopped between 65 and 70 with 3 consecutive normal Pap tests and no abnormal Pap or HPV tests in the past 10 years.   HPV screening.** / Every 3 years from ages 30 through ages 65 or 70 with a history of 3 consecutive normal Pap tests. Testing can be stopped between 65 and 70 with 3 consecutive normal Pap tests and no abnormal Pap or HPV tests in the past 10 years.   Fecal occult blood test (FOBT) of stool. / Every year beginning at age 50 and continuing until age 75. You may not need to do this test if you get a colonoscopy every 10 years.   Flexible sigmoidoscopy or colonoscopy.** / Every 5 years for a flexible sigmoidoscopy or every 10 years for a colonoscopy beginning at age 50 and continuing until age 75.   Hepatitis   C blood test.** / For all people born from 1945 through 1965 and any individual with known risks for hepatitis C.   Osteoporosis screening.** / A one-time screening for women ages 65 and over and women at risk for fractures or osteoporosis.   Skin self-exam. / Monthly.   Influenza immunization.** / Every year.   Pneumococcal polysaccharide immunization.** / 1 dose at age 65 (or older) if you have never been vaccinated.   Tetanus, diphtheria, pertussis (Tdap, Td) immunization. / A one-time dose of Tdap vaccine if you are over 65 and have contact with an infant, are a healthcare worker, or simply want to be protected from whooping cough. After that, you need a Td booster dose every 10 years.   Varicella immunization.** / Consult your caregiver.   Meningococcal immunization.** / Consult your caregiver.   Hepatitis A immunization.** / Consult your caregiver. 2 doses, 6 to 18 months apart.   Hepatitis B immunization.** / Check with your caregiver. 3 doses, usually over 6 months.  ** Family history and personal history of risk and conditions may change your caregiver's  recommendations. Document Released: 10/27/2001 Document Revised: 08/20/2011 Document Reviewed: 01/26/2011 [employer] Patient Information 2012 [employer]. 

## 2012-01-31 DIAGNOSIS — K649 Unspecified hemorrhoids: Secondary | ICD-10-CM | POA: Insufficient documentation

## 2012-01-31 NOTE — Assessment & Plan Note (Signed)
anusol supp and cream Pt requesting to go back to GI

## 2012-02-01 ENCOUNTER — Other Ambulatory Visit (INDEPENDENT_AMBULATORY_CARE_PROVIDER_SITE_OTHER): Payer: BC Managed Care – PPO

## 2012-02-01 DIAGNOSIS — Z0000 Encounter for general adult medical examination without abnormal findings: Secondary | ICD-10-CM

## 2012-02-01 DIAGNOSIS — E538 Deficiency of other specified B group vitamins: Secondary | ICD-10-CM

## 2012-02-01 LAB — LIPID PANEL
Total CHOL/HDL Ratio: 3
VLDL: 13.4 mg/dL (ref 0.0–40.0)

## 2012-02-01 LAB — BASIC METABOLIC PANEL
BUN: 9 mg/dL (ref 6–23)
CO2: 27 mEq/L (ref 19–32)
GFR: 95.82 mL/min (ref >60.00)
Glucose, Bld: 84 mg/dL (ref 70–99)
Potassium: 3.6 mEq/L (ref 3.5–5.1)

## 2012-02-01 LAB — CBC WITH DIFFERENTIAL/PLATELET
Basophils Absolute: 0 10*3/uL (ref 0.0–0.1)
Eosinophils Absolute: 0.2 10*3/uL (ref 0.0–0.7)
HCT: 36.3 % (ref 36.0–46.0)
Lymphs Abs: 3.2 10*3/uL (ref 0.7–4.0)
MCHC: 32.1 g/dL (ref 30.0–36.0)
Monocytes Absolute: 0.5 10*3/uL (ref 0.1–1.0)
Monocytes Relative: 7.7 % (ref 3.0–12.0)
Neutro Abs: 2.4 10*3/uL (ref 1.4–7.7)
Platelets: 237 10*3/uL (ref 150.0–400.0)
RDW: 15.9 % — ABNORMAL HIGH (ref 11.5–14.6)

## 2012-02-01 LAB — POCT URINALYSIS DIPSTICK
Blood, UA: NEGATIVE
Glucose, UA: NEGATIVE
Nitrite, UA: NEGATIVE
Protein, UA: NEGATIVE
Spec Grav, UA: <=1.005
Urobilinogen, UA: 0.2
pH, UA: 5

## 2012-02-01 LAB — HEPATIC FUNCTION PANEL
ALT: 13 U/L (ref 0–35)
AST: 17 U/L (ref 0–37)
Bilirubin, Direct: 0 mg/dL (ref 0.0–0.3)
Total Bilirubin: 0.5 mg/dL (ref 0.3–1.2)

## 2012-02-01 NOTE — Progress Notes (Signed)
LABS ONLY  

## 2012-02-02 LAB — VITAMIN B12: Vitamin B-12: 827 pg/mL (ref 211–911)

## 2012-02-10 ENCOUNTER — Other Ambulatory Visit: Payer: Self-pay | Admitting: Family Medicine

## 2012-02-10 MED ORDER — FLUCONAZOLE 150 MG PO TABS: ORAL_TABLET | ORAL | Status: DC

## 2012-03-04 ENCOUNTER — Encounter: Payer: Self-pay | Admitting: Gastroenterology

## 2012-03-04 ENCOUNTER — Ambulatory Visit (INDEPENDENT_AMBULATORY_CARE_PROVIDER_SITE_OTHER): Payer: BC Managed Care – PPO | Admitting: Gastroenterology

## 2012-03-04 VITALS — BP 170/80 | HR 68 | Ht 66.0 in | Wt 305.4 lb

## 2012-03-04 DIAGNOSIS — K649 Unspecified hemorrhoids: Secondary | ICD-10-CM

## 2012-03-04 MED ORDER — HYDROCORTISONE ACETATE 25 MG RE SUPP: 25.0000 mg | Freq: Two times a day (BID) | RECTAL | Status: AC

## 2012-03-04 NOTE — Patient Instructions (Addendum)
Flexible Sigmoidoscopy Your caregiver has ordered a flexible sigmoidoscopy. This is an exam to evaluate your lower colon. In this exam your colon is cleansed and a short fiber optic tube is inserted through your rectum and into your colon. The fiber optic scope (endoscope) is a short bundle of enclosed flexible small glass fibers. It transmits light to the area examined and images from that area to your caregiver. You do not have to worry about glass breakage in the endoscope. Discomfort is usually minimal. Sedatives and pain medications are generally not required. This exam helps to detect tumors (lumps), polyps, inflammation (swelling and soreness), and areas of bleeding. It may also be used to take biopsies. These are small pieces of tissue taken to examine under a microscope. LET YOUR CAREGIVER KNOW ABOUT:  Allergies.   Medications taken including herbs, eye drops, over the counter medications, and creams.   Use of steroids (by mouth or creams).   Previous problems with anesthetics or novocaine   Possibility of pregnancy, if this applies.   History of blood clots (thrombophlebitis).   History of bleeding or blood problems.   Previous surgery.   Other health problems.  BEFORE THE PROCEDURE Eat normally the night before the exam. Your caregiver may order a mild enema or laxative the night before. No eating or drinking should occur after midnight until the procedure is completed. A rectal suppository or enemas may be given in the morning prior to your procedure. You will be brought to the examination area in a hospital gown. You should be present 60 minutes prior to your procedure or as directed.  AFTER THE PROCEDURE   There is sometimes a little blood passed with the first bowel movement. Do not be concerned. Because air is often used during the exam, it is not unusual to pass gas and experience abdominal (belly) cramping. Walking or a warm pack on your abdomen may help with this. Do not  sleep with a heating pad as burns can occur.   You may resume all normal eating and activities.   Only take over-the-counter or prescription medicines for pain, discomfort, or fever as directed by your caregiver. Do not use aspirin or blood thinners if a biopsy (tissue sample) was taken. Consult your caregiver for medication usage if biopsies were taken.   Call for your results as instructed by your caregiver. Remember, it is your responsibility to obtain the results of your biopsy. Do not assume everything is fine because you do not hear from your caregiver.  SEEK IMMEDIATE MEDICAL CARE IF:  An oral temperature above 102 F (38.9 C) develops.   You pass large blood clots or fill a toilet with blood following the procedure. This may also occur 10 to 14 days following the procedure. It is more likely if a biopsy was taken.   You develop abdominal pain not relieved with medication or that is getting worse rather than better.  Document Released: 08/28/2000 Document Revised: 08/20/2011 Document Reviewed: 06/10/2005 [facility] Patient Information 2012 [place], [place].  Hemorrhoids Hemorrhoids are enlarged (dilated) veins around the rectum. There are 2 types of hemorrhoids, and the type of hemorrhoid is determined by its location. Internal hemorrhoids occur in the veins just inside the rectum.They are usually not painful, but they may bleed.However, they may poke through to the outside and become irritated and painful. External hemorrhoids involve the veins outside the anus and can be felt as a painful swelling or hard lump near the anus.They are often itchy and may  crack and bleed. Sometimes clots will form in the veins. This makes them swollen and painful. These are called thrombosed hemorrhoids. CAUSES Causes of hemorrhoids include:  Pregnancy. This increases the pressure in the hemorrhoidal veins.   Constipation.   Straining to have a bowel movement.   Obesity.   Heavy lifting or other  activity that caused you to strain.  TREATMENT Most of the time hemorrhoids improve in 1 to 2 weeks. However, if symptoms do not seem to be getting better or if you have a lot of rectal bleeding, your caregiver may perform a procedure to help make the hemorrhoids get smaller or remove them completely.Possible treatments include:  Rubber band ligation. A rubber band is placed at the base of the hemorrhoid to cut off the circulation.   Sclerotherapy. A chemical is injected to shrink the hemorrhoid.   Infrared light therapy. Tools are used to burn the hemorrhoid.   Hemorrhoidectomy. This is surgical removal of the hemorrhoid.  HOME CARE INSTRUCTIONS   Increase fiber in your diet. Ask your caregiver about using fiber supplements.   Drink enough water and fluids to keep your urine clear or pale yellow.   Exercise regularly.   Go to the bathroom when you have the urge to have a bowel movement. Do not wait.   Avoid straining to have bowel movements.   Keep the anal area dry and clean.   Only take over-the-counter or prescription medicines for pain, discomfort, or fever as directed by your caregiver.  If your hemorrhoids are thrombosed:  Take warm sitz baths for 20 to 30 minutes, 3 to 4 times per day.   If the hemorrhoids are very tender and swollen, place ice packs on the area as tolerated. Using ice packs between sitz baths may be helpful. Fill a plastic bag with ice. Place a towel between the bag of ice and your skin.   Medicated creams and suppositories may be used or applied as directed.   Do not use a donut-shaped pillow or sit on the toilet for long periods. This increases blood pooling and pain.  SEEK MEDICAL CARE IF:   You have increasing pain and swelling that is not controlled with your medicine.   You have uncontrolled bleeding.   You have difficulty or you are unable to have a bowel movement.   You have pain or inflammation outside the area of the hemorrhoids.   You  have chills or an oral temperature above 102 F (38.9 C).  MAKE SURE YOU:   Understand these instructions.   Will watch your condition.   Will get help right away if you are not doing well or get worse.  Document Released: 08/28/2000 Document Revised: 08/20/2011 Document Reviewed: 01/03/2008 [facility] Patient Information 2012 [place], [place].

## 2012-03-04 NOTE — Progress Notes (Signed)
History of Present Illness:  Ms. Jody [name] is a pleasant 41-year-old Afro-American female referred at the request of Dr. Lowne for evaluation of hemorrhoids. This is been a long-standing problem characterized by prolapse of hemorrhoids with a bowel movement. These can be reduced but often will recur. With prolapse she has discomfort. She denies bleeding. She moves her bowels regularly. She has used suppositories in the past without success.    Review of Systems: Pertinent positive and negative review of systems were noted in the above HPI section. All other review of systems were otherwise negative.    Current Medications, Allergies, Past Medical History, Past Surgical History, Family History and Social History were reviewed in [facility] electronic medical record  Vital signs were reviewed in today's medical record. Physical Exam: General: Well developed , well nourished, no acute distress Head: Normocephalic and atraumatic Eyes:  sclerae anicteric, EOMI Ears: Normal auditory acuity Mouth: No deformity or lesions Lungs: Clear throughout to auscultation Heart: Regular rate and rhythm; no murmurs, rubs or bruits Abdomen: Soft, non tender and non distended. No masses, hepatosplenomegaly or hernias noted. Normal Bowel sounds Rectal: There are reducible internal hemorrhoids. Musculoskeletal: Symmetrical with no gross deformities  Pulses:  Normal pulses noted Extremities: No clubbing, cyanosis, edema or deformities noted Neurological: Alert oriented x 4, grossly nonfocal Psychological:  Alert and cooperative. Normal mood and affect    

## 2012-03-04 NOTE — Assessment & Plan Note (Signed)
Grade 3 hemorrhoids refractory to medical therapy  Recommendations #1 Anusol HC suppositories #2 band ligation of hemorrhoids

## 2012-03-09 ENCOUNTER — Encounter (HOSPITAL_COMMUNITY)
Admission: RE | Disposition: A | Discharge status: Home / Self Care | Payer: Self-pay | Source: Ambulatory Visit | Attending: Gastroenterology

## 2012-03-09 ENCOUNTER — Encounter (HOSPITAL_COMMUNITY): Payer: Self-pay

## 2012-03-09 ENCOUNTER — Ambulatory Visit (HOSPITAL_COMMUNITY)
Admission: RE | Admit: 2012-03-09 | Discharge: 2012-03-09 | Disposition: A | Discharge status: Home / Self Care | Payer: BC Managed Care – PPO | Source: Ambulatory Visit | Attending: Gastroenterology | Admitting: Gastroenterology

## 2012-03-09 ENCOUNTER — Encounter (HOSPITAL_COMMUNITY): Payer: Self-pay | Admitting: *Deleted

## 2012-03-09 DIAGNOSIS — K648 Other hemorrhoids: Secondary | ICD-10-CM | POA: Insufficient documentation

## 2012-03-09 HISTORY — PX: FLEXIBLE SIGMOIDOSCOPY: SHX5431

## 2012-03-09 HISTORY — DX: Unspecified asthma, uncomplicated: J45.909

## 2012-03-09 HISTORY — DX: Gastro-esophageal reflux disease without esophagitis: K21.9

## 2012-03-09 SURGERY — SIGMOIDOSCOPY, FLEXIBLE
Anesthesia: Moderate Sedation

## 2012-03-09 MED ORDER — FENTANYL CITRATE 0.05 MG/ML IJ SOLN
INTRAMUSCULAR | Status: DC | PRN
  Administered 2012-03-09 (×2): 25 ug via INTRAVENOUS

## 2012-03-09 MED ORDER — MIDAZOLAM HCL 10 MG/2ML IJ SOLN
INTRAMUSCULAR | Status: DC | PRN
  Administered 2012-03-09 (×2): 2 mg via INTRAVENOUS

## 2012-03-09 MED ORDER — SODIUM CHLORIDE 0.9 % IV SOLN: Freq: Once | INTRAVENOUS | Status: DC

## 2012-03-09 MED ORDER — DIPHENHYDRAMINE HCL 50 MG/ML IJ SOLN
INTRAMUSCULAR | Status: AC
  Filled 2012-03-09: qty 1

## 2012-03-09 MED ORDER — DIPHENHYDRAMINE HCL 50 MG/ML IJ SOLN
INTRAMUSCULAR | Status: DC | PRN
  Administered 2012-03-09: 25 mg via INTRAVENOUS

## 2012-03-09 MED ORDER — FENTANYL CITRATE 0.05 MG/ML IJ SOLN
INTRAMUSCULAR | Status: AC
  Filled 2012-03-09: qty 2

## 2012-03-09 MED ORDER — MIDAZOLAM HCL 10 MG/2ML IJ SOLN
INTRAMUSCULAR | Status: AC
  Filled 2012-03-09: qty 2

## 2012-03-09 NOTE — Discharge Instructions (Addendum)
Hemorrhoid Banding Hemorrhoids are veins in the anus and lower rectum that become enlarged. The most common symptoms are rectal bleeding, itching, and sometimes pain. Hemorrhoids might come out with straining or having a bowel movement, and they can sometimes be pushed back in. There are internal and external hemorrhoids. Only internal hemorrhoids can be treated with banding. In this procedure, a rubber band is placed near the hemorrhoid tissue, cutting off the blood supply. This procedure prevents the hemorrhoids from slipping down. LET YOUR CAREGIVER KNOW ABOUT: All medicines you are taking, especially blood thinners such as aspirin and coumadin.  RISKS AND COMPLICATIONS This is not a painful procedure, but if you do have intense pain immediately let your surgeon know because the band may need to be removed. You may have some mild pain or discomfort in the first 2 days or so after treatment. Sometimes there may be delayed bleeding in the first week after treatment.  BEFORE THE PROCEDURE  There is no special preparation needed before banding. Your surgeon may have you do an enema prior to the procedure. You will go home the same day.  HOME CARE INSTRUCTIONS   Your surgeon might instruct you to do sitz baths as needed if you have discomfort or after a bowel movement.   You may be instructed to use fiber supplements.  SEEK MEDICAL CARE IF:  You have an increase in pain.   Your pain does not get better.  SEEK IMMEDIATE MEDICAL CARE IF:  You have intense pain.   Fever greater than 100.5 F (38.1 C).   Bleeding that does not stop, or pus from the anus.  Document Released: 06/28/2009 Document Revised: 08/20/2011 Document Reviewed: 06/28/2009 [facility] Patient Information 2012 [place], [place].

## 2012-03-09 NOTE — Interval H&P Note (Signed)
History and Physical Interval Note:  03/09/2012 12:21 PM  Jody [name]  has presented today for surgery, with the diagnosis of hemorrhoids  The various methods of treatment have been discussed with the patient and family. After consideration of risks, benefits and other options for treatment, the patient has consented to  Procedure(s) (LRB): FLEXIBLE SIGMOIDOSCOPY (N/A) HEMORRHOID BANDING (N/A) as a surgical intervention .  The patient's history has been reviewed, patient examined, no change in status, stable for surgery.  I have reviewed the patients' chart and labs.  Questions were answered to the patient's satisfaction.    The recent H&P (dated *03/04/12**) was reviewed, the patient was examined and there is no change in the patients condition since that H&P was completed.   [name]  03/09/2012, 12:21 PM    [name]

## 2012-03-09 NOTE — Op Note (Signed)
[facility] [address] [place], [place]  [address]  FLEXIBLE SIGMOIDOSCOPY PROCEDURE REPORT  PATIENT:  Jody [name], Jody [name]  MR#:  [id number] BIRTHDATE:  1971/02/16, 40 yrs. old  GENDER:  female  ENDOSCOPIST:  Barbette Hair. Arlyce Dice, MD Referred by:  Loreen Freud, DO  PROCEDURE DATE:  03/09/2012 PROCEDURE:  Flexible Sigmoidoscopy with banding ASA CLASS:  Class II INDICATIONS:  treatment of hemorrhoids  MEDICATIONS:   These medications were titrated to patient response per physician's verbal order, Fentanyl 50 mcg IV, Versed 4 mg IV, Benadryl 25 mg IV  DESCRIPTION OF PROCEDURE:   After the risks benefits and alternatives of the procedure were thoroughly explained, informed consent was obtained.  Digital rectal exam was performed and revealed no abnormalities.   The  endoscope was introduced through the anus and advanced to the sigmoid colon, without limitations. The quality of the prep was .  The instrument was then slowly withdrawn as the mucosa was fully examined.  Internal hemorrhoids were found in the rectum. hemorrhoidal banding 4 bands were placed circumferentially just above the dentate line  A nodule was found. A single nodule with normal overlying mucosa was seen in the interval   Retroflexed views in the rectum revealed Internal hemorrhoids were seen on retroflexion.    The scope was then withdrawn from the patient and the procedure terminated.  COMPLICATIONS:  None  ENDOSCOPIC IMPRESSION: Internal hemorrhoids-status post band ligation  RECOMMENDATIONS: 1) Call the office to schedule a followup office visit for __4 weeks  REPEAT EXAM:  No  ______________________________ Barbette Hair. Arlyce Dice, MD  CC:  n. eSIGNED:   Barbette Hair. Tiandra Swoveland at 03/09/2012 12:58 PM  Tilda Burrow, [phone]

## 2012-03-09 NOTE — H&P (View-Only) (Signed)
History of Present Illness:  Ms. Lottman is a pleasant 41 year old Afro-American female referred at the request of Dr. Laury Axon for evaluation of hemorrhoids. This is been a long-standing problem characterized by prolapse of hemorrhoids with a bowel movement. These can be reduced but often will recur. With prolapse she has discomfort. She denies bleeding. She moves her bowels regularly. She has used suppositories in the past without success.    Review of Systems: Pertinent positive and negative review of systems were noted in the above HPI section. All other review of systems were otherwise negative.    Current Medications, Allergies, Past Medical History, Past Surgical History, Family History and Social History were reviewed in [facility] electronic medical record  Vital signs were reviewed in today's medical record. Physical Exam: General: Well developed , well nourished, no acute distress Head: Normocephalic and atraumatic Eyes:  sclerae anicteric, EOMI Ears: Normal auditory acuity Mouth: No deformity or lesions Lungs: Clear throughout to auscultation Heart: Regular rate and rhythm; no murmurs, rubs or bruits Abdomen: Soft, non tender and non distended. No masses, hepatosplenomegaly or hernias noted. Normal Bowel sounds Rectal: There are reducible internal hemorrhoids. Musculoskeletal: Symmetrical with no gross deformities  Pulses:  Normal pulses noted Extremities: No clubbing, cyanosis, edema or deformities noted Neurological: Alert oriented x 4, grossly nonfocal Psychological:  Alert and cooperative. Normal mood and affect

## 2012-03-10 ENCOUNTER — Encounter (HOSPITAL_COMMUNITY): Payer: Self-pay | Admitting: Gastroenterology

## 2012-11-03 ENCOUNTER — Telehealth: Payer: Self-pay | Admitting: Family Medicine

## 2012-11-03 NOTE — Telephone Encounter (Signed)
pt called has some questions regarding medical conditions, specifically what was the 1st date she started taking b12, cb# [phone]

## 2012-11-03 NOTE — Telephone Encounter (Signed)
Pt wanted to know if she had any issue with her B12  In 2011. Advise Pt per her chart no level were checked prior to 2012. Pt ok info.

## 2013-05-19 ENCOUNTER — Other Ambulatory Visit: Payer: Self-pay

## 2013-05-19 DIAGNOSIS — Z1231 Encounter for screening mammogram for malignant neoplasm of breast: Secondary | ICD-10-CM

## 2013-06-13 ENCOUNTER — Ambulatory Visit (INDEPENDENT_AMBULATORY_CARE_PROVIDER_SITE_OTHER): Payer: BC Managed Care – PPO | Admitting: Family Medicine

## 2013-06-13 ENCOUNTER — Other Ambulatory Visit (HOSPITAL_COMMUNITY)
Admission: RE | Admit: 2013-06-13 | Discharge: 2013-06-13 | Disposition: A | Discharge status: Home / Self Care | Payer: BC Managed Care – PPO | Source: Ambulatory Visit | Attending: Family Medicine | Admitting: Family Medicine

## 2013-06-13 ENCOUNTER — Ambulatory Visit
Admission: RE | Admit: 2013-06-13 | Discharge: 2013-06-13 | Disposition: A | Discharge status: Home / Self Care | Payer: BC Managed Care – PPO | Source: Ambulatory Visit

## 2013-06-13 ENCOUNTER — Encounter: Payer: Self-pay | Admitting: Family Medicine

## 2013-06-13 VITALS — BP 114/80 | HR 79 | Temp 98.3°F | Ht 65.0 in | Wt 307.4 lb

## 2013-06-13 DIAGNOSIS — G43909 Migraine, unspecified, not intractable, without status migrainosus: Secondary | ICD-10-CM

## 2013-06-13 DIAGNOSIS — Z0000 Encounter for general adult medical examination without abnormal findings: Secondary | ICD-10-CM

## 2013-06-13 DIAGNOSIS — Z1231 Encounter for screening mammogram for malignant neoplasm of breast: Secondary | ICD-10-CM

## 2013-06-13 DIAGNOSIS — Z124 Encounter for screening for malignant neoplasm of cervix: Secondary | ICD-10-CM

## 2013-06-13 DIAGNOSIS — Z1151 Encounter for screening for human papillomavirus (HPV): Secondary | ICD-10-CM | POA: Insufficient documentation

## 2013-06-13 DIAGNOSIS — Z01419 Encounter for gynecological examination (general) (routine) without abnormal findings: Secondary | ICD-10-CM | POA: Insufficient documentation

## 2013-06-13 LAB — POCT URINALYSIS DIPSTICK
Bilirubin, UA: NEGATIVE
Blood, UA: NEGATIVE
Ketones, UA: NEGATIVE
Protein, UA: NEGATIVE
Spec Grav, UA: <=1.005
pH, UA: 6

## 2013-06-13 MED ORDER — TOPIRAMATE 50 MG PO TABS: 50.0000 mg | ORAL_TABLET | Freq: Two times a day (BID) | ORAL | Status: DC

## 2013-06-13 MED ORDER — ESOMEPRAZOLE MAGNESIUM 40 MG PO CPDR: 40.0000 mg | DELAYED_RELEASE_CAPSULE | Freq: Every day | ORAL | Status: DC

## 2013-06-13 NOTE — Assessment & Plan Note (Signed)
rx given to pt --see meds and orders rto 1 month

## 2013-06-13 NOTE — Patient Instructions (Addendum)
Preventive Care for Adults, Female A healthy lifestyle and preventive care can promote health and wellness. Preventive health guidelines for women include the following key practices.  A routine yearly physical is a good way to check with your caregiver about your health and preventive screening. It is a chance to share any concerns and updates on your health, and to receive a thorough exam.  Visit your dentist for a routine exam and preventive care every 6 months. Brush your teeth twice a day and floss once a day. Good oral hygiene prevents tooth decay and gum disease.  The frequency of eye exams is based on your age, health, family medical history, use of contact lenses, and other factors. Follow your caregiver's recommendations for frequency of eye exams.  Eat a healthy diet. Foods like vegetables, fruits, whole grains, low-fat dairy products, and lean protein foods contain the nutrients you need without too many calories. Decrease your intake of foods high in solid fats, added sugars, and salt. Eat the right amount of calories for you.Get information about a proper diet from your caregiver, if necessary.  Regular physical exercise is one of the most important things you can do for your health. Most adults should get at least 150 minutes of moderate-intensity exercise (any activity that increases your heart rate and causes you to sweat) each week. In addition, most adults need muscle-strengthening exercises on 2 or more days a week.  Maintain a healthy weight. The body mass index (BMI) is a screening tool to identify possible weight problems. It provides an estimate of body fat based on height and weight. Your caregiver can help determine your BMI, and can help you achieve or maintain a healthy weight.For adults 20 years and older:  A BMI below 18.5 is considered underweight.  A BMI of 18.5 to 24.9 is normal.  A BMI of 25 to 29.9 is considered overweight.  A BMI of 30 and above is  considered obese.  Maintain normal blood lipids and cholesterol levels by exercising and minimizing your intake of saturated fat. Eat a balanced diet with plenty of fruit and vegetables. Blood tests for lipids and cholesterol should begin at age 20 and be repeated every 5 years. If your lipid or cholesterol levels are high, you are over 50, or you are at high risk for heart disease, you may need your cholesterol levels checked more frequently.Ongoing high lipid and cholesterol levels should be treated with medicines if diet and exercise are not effective.  If you smoke, find out from your caregiver how to quit. If you do not use tobacco, do not start.  If you are pregnant, do not drink alcohol. If you are breastfeeding, be very cautious about drinking alcohol. If you are not pregnant and choose to drink alcohol, do not exceed 1 drink per day. One drink is considered to be 12 ounces (355 mL) of beer, 5 ounces (148 mL) of wine, or 1.5 ounces (44 mL) of liquor.  Avoid use of street drugs. Do not share needles with anyone. Ask for help if you need support or instructions about stopping the use of drugs.  High blood pressure causes heart disease and increases the risk of stroke. Your blood pressure should be checked at least every 1 to 2 years. Ongoing high blood pressure should be treated with medicines if weight loss and exercise are not effective.  If you are 55 to 40 years old, ask your caregiver if you should take aspirin to prevent strokes.  Diabetes   screening involves taking a blood sample to check your fasting blood sugar level. This should be done once every 3 years, after age 45, if you are within normal weight and without risk factors for diabetes. Testing should be considered at a younger age or be carried out more frequently if you are overweight and have at least 1 risk factor for diabetes.  Breast cancer screening is essential preventive care for women. You should practice "breast  self-awareness." This means understanding the normal appearance and feel of your breasts and may include breast self-examination. Any changes detected, no matter how small, should be reported to a caregiver. Women in their 20s and 30s should have a clinical breast exam (CBE) by a caregiver as part of a regular health exam every 1 to 3 years. After age 40, women should have a CBE every year. Starting at age 40, women should consider having a mammography (breast X-ray test) every year. Women who have a family history of breast cancer should talk to their caregiver about genetic screening. Women at a high risk of breast cancer should talk to their caregivers about having magnetic resonance imaging (MRI) and a mammography every year.  The Pap test is a screening test for cervical cancer. A Pap test can show cell changes on the cervix that might become cervical cancer if left untreated. A Pap test is a procedure in which cells are obtained and examined from the lower end of the uterus (cervix).  Women should have a Pap test starting at age 21.  Between ages 21 and 29, Pap tests should be repeated every 2 years.  Beginning at age 30, you should have a Pap test every 3 years as long as the past 3 Pap tests have been normal.  Some women have medical problems that increase the chance of getting cervical cancer. Talk to your caregiver about these problems. It is especially important to talk to your caregiver if a new problem develops soon after your last Pap test. In these cases, your caregiver may recommend more frequent screening and Pap tests.  The above recommendations are the same for women who have or have not gotten the vaccine for human papillomavirus (HPV).  If you had a hysterectomy for a problem that was not cancer or a condition that could lead to cancer, then you no longer need Pap tests. Even if you no longer need a Pap test, a regular exam is a good idea to make sure no other problems are  starting.  If you are between ages 65 and 70, and you have had normal Pap tests going back 10 years, you no longer need Pap tests. Even if you no longer need a Pap test, a regular exam is a good idea to make sure no other problems are starting.  If you have had past treatment for cervical cancer or a condition that could lead to cancer, you need Pap tests and screening for cancer for at least 20 years after your treatment.  If Pap tests have been discontinued, risk factors (such as a new sexual partner) need to be reassessed to determine if screening should be resumed.  The HPV test is an additional test that may be used for cervical cancer screening. The HPV test looks for the virus that can cause the cell changes on the cervix. The cells collected during the Pap test can be tested for HPV. The HPV test could be used to screen women aged 30 years and older, and should   be used in women of any age who have unclear Pap test results. After the age of 30, women should have HPV testing at the same frequency as a Pap test.  Colorectal cancer can be detected and often prevented. Most routine colorectal cancer screening begins at the age of 50 and continues through age 75. However, your caregiver may recommend screening at an earlier age if you have risk factors for colon cancer. On a yearly basis, your caregiver may provide home test kits to check for hidden blood in the stool. Use of a small camera at the end of a tube, to directly examine the colon (sigmoidoscopy or colonoscopy), can detect the earliest forms of colorectal cancer. Talk to your caregiver about this at age 50, when routine screening begins. Direct examination of the colon should be repeated every 5 to 10 years through age 75, unless early forms of pre-cancerous polyps or small growths are found.  Hepatitis C blood testing is recommended for all people born from 1945 through 1965 and any individual with known risks for hepatitis C.  Practice  safe sex. Use condoms and avoid high-risk sexual practices to reduce the spread of sexually transmitted infections (STIs). STIs include gonorrhea, chlamydia, syphilis, trichomonas, herpes, HPV, and human immunodeficiency virus (HIV). Herpes, HIV, and HPV are viral illnesses that have no cure. They can result in disability, cancer, and death. Sexually active women aged 25 and younger should be checked for chlamydia. Older women with new or multiple partners should also be tested for chlamydia. Testing for other STIs is recommended if you are sexually active and at increased risk.  Osteoporosis is a disease in which the bones lose minerals and strength with aging. This can result in serious bone fractures. The risk of osteoporosis can be identified using a bone density scan. Women ages 65 and over and women at risk for fractures or osteoporosis should discuss screening with their caregivers. Ask your caregiver whether you should take a calcium supplement or vitamin D to reduce the rate of osteoporosis.  Menopause can be associated with physical symptoms and risks. Hormone replacement therapy is available to decrease symptoms and risks. You should talk to your caregiver about whether hormone replacement therapy is right for you.  Use sunscreen with sun protection factor (SPF) of 30 or more. Apply sunscreen liberally and repeatedly throughout the day. You should seek shade when your shadow is shorter than you. Protect yourself by wearing long sleeves, pants, a wide-brimmed hat, and sunglasses year round, whenever you are outdoors.  Once a month, do a whole body skin exam, using a mirror to look at the skin on your back. Notify your caregiver of new moles, moles that have irregular borders, moles that are larger than a pencil eraser, or moles that have changed in shape or color.  Stay current with required immunizations.  Influenza. You need a dose every fall (or winter). The composition of the flu vaccine  changes each year, so being vaccinated once is not enough.  Pneumococcal polysaccharide. You need 1 to 2 doses if you smoke cigarettes or if you have certain chronic medical conditions. You need 1 dose at age 65 (or older) if you have never been vaccinated.  Tetanus, diphtheria, pertussis (Tdap, Td). Get 1 dose of Tdap vaccine if you are younger than age 65, are over 65 and have contact with an infant, are a healthcare worker, are pregnant, or simply want to be protected from whooping cough. After that, you need a Td   booster dose every 10 years. Consult your caregiver if you have not had at least 3 tetanus and diphtheria-containing shots sometime in your life or have a deep or dirty wound.  HPV. You need this vaccine if you are a woman age 26 or younger. The vaccine is given in 3 doses over 6 months.  Measles, mumps, rubella (MMR). You need at least 1 dose of MMR if you were born in 1957 or later. You may also need a second dose.  Meningococcal. If you are age 19 to 21 and a first-year college student living in a residence hall, or have one of several medical conditions, you need to get vaccinated against meningococcal disease. You may also need additional booster doses.  Zoster (shingles). If you are age 60 or older, you should get this vaccine.  Varicella (chickenpox). If you have never had chickenpox or you were vaccinated but received only 1 dose, talk to your caregiver to find out if you need this vaccine.  Hepatitis A. You need this vaccine if you have a specific risk factor for hepatitis A virus infection or you simply wish to be protected from this disease. The vaccine is usually given as 2 doses, 6 to 18 months apart.  Hepatitis B. You need this vaccine if you have a specific risk factor for hepatitis B virus infection or you simply wish to be protected from this disease. The vaccine is given in 3 doses, usually over 6 months. Preventive Services / Frequency Ages 19 to 39  Blood  pressure check.** / Every 1 to 2 years.  Lipid and cholesterol check.** / Every 5 years beginning at age 20.  Clinical breast exam.** / Every 3 years for women in their 20s and 30s.  Pap test.** / Every 2 years from ages 21 through 29. Every 3 years starting at age 30 through age 65 or 70 with a history of 3 consecutive normal Pap tests.  HPV screening.** / Every 3 years from ages 30 through ages 65 to 70 with a history of 3 consecutive normal Pap tests.  Hepatitis C blood test.** / For any individual with known risks for hepatitis C.  Skin self-exam. / Monthly.  Influenza immunization.** / Every year.  Pneumococcal polysaccharide immunization.** / 1 to 2 doses if you smoke cigarettes or if you have certain chronic medical conditions.  Tetanus, diphtheria, pertussis (Tdap, Td) immunization. / A one-time dose of Tdap vaccine. After that, you need a Td booster dose every 10 years.  HPV immunization. / 3 doses over 6 months, if you are 26 and younger.  Measles, mumps, rubella (MMR) immunization. / You need at least 1 dose of MMR if you were born in 1957 or later. You may also need a second dose.  Meningococcal immunization. / 1 dose if you are age 19 to 21 and a first-year college student living in a residence hall, or have one of several medical conditions, you need to get vaccinated against meningococcal disease. You may also need additional booster doses.  Varicella immunization.** / Consult your caregiver.  Hepatitis A immunization.** / Consult your caregiver. 2 doses, 6 to 18 months apart.  Hepatitis B immunization.** / Consult your caregiver. 3 doses usually over 6 months. Ages 40 to 64  Blood pressure check.** / Every 1 to 2 years.  Lipid and cholesterol check.** / Every 5 years beginning at age 20.  Clinical breast exam.** / Every year after age 40.  Mammogram.** / Every year beginning at age 40   and continuing for as long as you are in good health. Consult with your  caregiver.  Pap test.** / Every 3 years starting at age 30 through age 65 or 70 with a history of 3 consecutive normal Pap tests.  HPV screening.** / Every 3 years from ages 30 through ages 65 to 70 with a history of 3 consecutive normal Pap tests.  Fecal occult blood test (FOBT) of stool. / Every year beginning at age 50 and continuing until age 75. You may not need to do this test if you get a colonoscopy every 10 years.  Flexible sigmoidoscopy or colonoscopy.** / Every 5 years for a flexible sigmoidoscopy or every 10 years for a colonoscopy beginning at age 50 and continuing until age 75.  Hepatitis C blood test.** / For all people born from 1945 through 1965 and any individual with known risks for hepatitis C.  Skin self-exam. / Monthly.  Influenza immunization.** / Every year.  Pneumococcal polysaccharide immunization.** / 1 to 2 doses if you smoke cigarettes or if you have certain chronic medical conditions.  Tetanus, diphtheria, pertussis (Tdap, Td) immunization.** / A one-time dose of Tdap vaccine. After that, you need a Td booster dose every 10 years.  Measles, mumps, rubella (MMR) immunization. / You need at least 1 dose of MMR if you were born in 1957 or later. You may also need a second dose.  Varicella immunization.** / Consult your caregiver.  Meningococcal immunization.** / Consult your caregiver.  Hepatitis A immunization.** / Consult your caregiver. 2 doses, 6 to 18 months apart.  Hepatitis B immunization.** / Consult your caregiver. 3 doses, usually over 6 months. Ages 65 and over  Blood pressure check.** / Every 1 to 2 years.  Lipid and cholesterol check.** / Every 5 years beginning at age 20.  Clinical breast exam.** / Every year after age 40.  Mammogram.** / Every year beginning at age 40 and continuing for as long as you are in good health. Consult with your caregiver.  Pap test.** / Every 3 years starting at age 30 through age 65 or 70 with a 3  consecutive normal Pap tests. Testing can be stopped between 65 and 70 with 3 consecutive normal Pap tests and no abnormal Pap or HPV tests in the past 10 years.  HPV screening.** / Every 3 years from ages 30 through ages 65 or 70 with a history of 3 consecutive normal Pap tests. Testing can be stopped between 65 and 70 with 3 consecutive normal Pap tests and no abnormal Pap or HPV tests in the past 10 years.  Fecal occult blood test (FOBT) of stool. / Every year beginning at age 50 and continuing until age 75. You may not need to do this test if you get a colonoscopy every 10 years.  Flexible sigmoidoscopy or colonoscopy.** / Every 5 years for a flexible sigmoidoscopy or every 10 years for a colonoscopy beginning at age 50 and continuing until age 75.  Hepatitis C blood test.** / For all people born from 1945 through 1965 and any individual with known risks for hepatitis C.  Osteoporosis screening.** / A one-time screening for women ages 65 and over and women at risk for fractures or osteoporosis.  Skin self-exam. / Monthly.  Influenza immunization.** / Every year.  Pneumococcal polysaccharide immunization.** / 1 dose at age 65 (or older) if you have never been vaccinated.  Tetanus, diphtheria, pertussis (Tdap, Td) immunization. / A one-time dose of Tdap vaccine if you are over   65 and have contact with an infant, are a healthcare worker, or simply want to be protected from whooping cough. After that, you need a Td booster dose every 10 years.  Varicella immunization.** / Consult your caregiver.  Meningococcal immunization.** / Consult your caregiver.  Hepatitis A immunization.** / Consult your caregiver. 2 doses, 6 to 18 months apart.  Hepatitis B immunization.** / Check with your caregiver. 3 doses, usually over 6 months. ** Family history and personal history of risk and conditions may change your caregiver's recommendations. Document Released: 10/27/2001 Document Revised: 11/23/2011  Document Reviewed: 01/26/2011 [employer] Patient Information 2014 [employer].  

## 2013-06-13 NOTE — Progress Notes (Signed)
Subjective:     Jody [name] is a 42 y.o. female and is here for a comprehensive physical exam. The patient reports problems - R sided chest pain last night that woke her out of a deep sleep--- radiated to R arm and lasted  about 15 minutes.  History   Social History  . Marital Status: Single    Spouse Name: N/A    Number of Children: 3  . Years of Education: N/A   Occupational History  . disability    Social History Main Topics  . Smoking status: Never Smoker   . Smokeless tobacco: Never Used  . Alcohol Use: No  . Drug Use: No  . Sexual Activity: Yes    Partners: Male   Other Topics Concern  . Not on file   Social History Narrative   Exercise-- trying to do some steps   Health Maintenance  Topic Date Due  . Influenza Vaccine  06/13/2014  . Mammogram  06/13/2014  . Pap Smear  01/29/2015  . Tetanus/tdap  04/03/2017    The following portions of the patient's history were reviewed and updated as appropriate:  She  has a past medical history of Migraines; Depression; History of anal fissures; Anemia; Anxiety; History of gallstones; Obesity; Asthma; and GERD (gastroesophageal reflux disease). She  does not have any pertinent problems on file. She  has past surgical history that includes Cholecystectomy; Tubal ligation; and Flexible sigmoidoscopy (03/09/2012). Her family history includes Arthritis in her father; Breast cancer in her mother; Cataracts in her maternal grandmother; Coronary artery disease in her maternal grandmother; Diabetes in her father; Glaucoma in her maternal grandmother; Heart disease in her maternal grandmother; Hepatitis (age of onset: 71) in her father; Hypertension in her father and mother; Kidney disease in her father; Obesity in an other family member. She  reports that she has never smoked. She has never used smokeless tobacco. She reports that she does not drink alcohol or use illicit drugs. She has a current medication list which includes the  following prescription(s): esomeprazole and topiramate. No current outpatient prescriptions on file prior to visit.   No current facility-administered medications on file prior to visit.   She has No Known Allergies..  Review of Systems Review of Systems  Constitutional: Negative for activity change, appetite change and fatigue.  HENT: Negative for hearing loss, congestion, tinnitus and ear discharge.  dentist q55m Eyes: Negative for visual disturbance (see optho q1y -- vision corrected to 20/20 with glasses).  Respiratory: Negative for cough, chest tightness and shortness of breath.   Cardiovascular: + r sided chest pain Gastrointestinal: Negative for abdominal pain, diarrhea, constipation and abdominal distention.  Genitourinary: Negative for urgency, frequency, decreased urine volume and difficulty urinating.  Musculoskeletal: Negative for back pain, arthralgias and gait problem.  Skin: Negative for color change, pallor and rash.  Neurological: Negative for dizziness, light-headedness, numbness and headaches.  Hematological: Negative for adenopathy. Does not bruise/bleed easily.  Psychiatric/Behavioral: Negative for suicidal ideas, confusion, sleep disturbance, self-injury, dysphoric mood, decreased concentration and agitation.       Objective:    BP 114/80  Pulse 79  Temp(Src) 98.3 F (36.8 C) (Oral)  Ht 5\' 5"  (1.651 m)  Wt 307 lb 6.4 oz (139.436 kg)  BMI 51.15 kg/m2  SpO2 98%  LMP 05/20/2013 General appearance: alert, cooperative, appears stated age and no distress Head: Normocephalic, without obvious abnormality, atraumatic Eyes: conjunctivae/corneas clear. PERRL, EOM's intact. Fundi benign. Ears: normal TM's and external ear canals both ears  Nose: Nares normal. Septum midline. Mucosa normal. No drainage or sinus tenderness. Throat: lips, mucosa, and tongue normal; teeth and gums normal Neck: no adenopathy, no carotid bruit, no JVD, supple, symmetrical, trachea  midline and thyroid not enlarged, symmetric, no tenderness/mass/nodules Back: symmetric, no curvature. ROM normal. No CVA tenderness. Lungs: clear to auscultation bilaterally Breasts: normal appearance, no masses or tenderness Heart: regular rate and rhythm, S1, S2 normal, no murmur, click, rub or gallop Abdomen: soft, non-tender; bowel sounds normal; no masses,  no organomegaly Pelvic: cervix normal in appearance, external genitalia normal, no adnexal masses or tenderness, no cervical motion tenderness, rectovaginal septum normal, uterus normal size, shape, and consistency and vagina normal without discharge--pap done Extremities: extremities normal, atraumatic, no cyanosis or edema Pulses: 2+ and symmetric Skin: Skin color, texture, turgor normal. No rashes or lesions Lymph nodes: Cervical, supraclavicular, and axillary nodes normal. Neurologic: Alert and oriented X 3, normal strength and tone. Normal symmetric reflexes. Normal coordination and gait Psych--no depression, no anxiety      Assessment:    Healthy female exam.    Plan:     ghm utd Check labs See After Visit Summary for Counseling Recommendations

## 2013-06-14 LAB — HEPATIC FUNCTION PANEL
Albumin: 3.5 g/dL (ref 3.5–5.2)
Alkaline Phosphatase: 45 U/L (ref 39–117)

## 2013-06-14 LAB — CBC WITH DIFFERENTIAL/PLATELET
Basophils Absolute: 0 10*3/uL (ref 0.0–0.1)
Eosinophils Absolute: 0.1 10*3/uL (ref 0.0–0.7)
Hemoglobin: 12.1 g/dL (ref 12.0–15.0)
Lymphocytes Relative: 41.6 % (ref 12.0–46.0)
MCHC: 32.7 g/dL (ref 30.0–36.0)
MCV: 81.6 fl (ref 78.0–100.0)
Monocytes Absolute: 0.3 10*3/uL (ref 0.1–1.0)
Neutro Abs: 4.9 10*3/uL (ref 1.4–7.7)
RDW: 15.9 % — ABNORMAL HIGH (ref 11.5–14.6)

## 2013-06-14 LAB — BASIC METABOLIC PANEL
CO2: 25 mEq/L (ref 19–32)
Calcium: 8.9 mg/dL (ref 8.4–10.5)
Creatinine, Ser: 0.8 mg/dL (ref 0.4–1.2)
Glucose, Bld: 75 mg/dL (ref 70–99)
Sodium: 136 mEq/L (ref 135–145)

## 2013-06-14 LAB — LIPID PANEL
Cholesterol: 141 mg/dL (ref 0–200)
HDL: 44.8 mg/dL (ref >39.00)
Triglycerides: 80 mg/dL (ref 0.0–149.0)
VLDL: 16 mg/dL (ref 0.0–40.0)

## 2013-06-19 ENCOUNTER — Other Ambulatory Visit: Payer: Self-pay | Admitting: Family Medicine

## 2013-06-19 DIAGNOSIS — R928 Other abnormal and inconclusive findings on diagnostic imaging of breast: Secondary | ICD-10-CM

## 2013-07-03 ENCOUNTER — Ambulatory Visit
Admission: RE | Admit: 2013-07-03 | Discharge: 2013-07-03 | Disposition: A | Discharge status: Home / Self Care | Payer: BC Managed Care – PPO | Source: Ambulatory Visit | Attending: Family Medicine | Admitting: Family Medicine

## 2013-07-03 DIAGNOSIS — R928 Other abnormal and inconclusive findings on diagnostic imaging of breast: Secondary | ICD-10-CM

## 2013-07-24 ENCOUNTER — Telehealth: Payer: Self-pay | Admitting: Family Medicine

## 2013-07-24 NOTE — Telephone Encounter (Signed)
Patient called and stated that she had an Essure procedure done in 2009 and she is experiencing problems now. She saw on tv were there was a commercial talking about the Essure procedures and if patients was experiences any complications. Patient did not know if she needed to see dr Laury Axon for this or if she needed to be referred.

## 2013-07-25 NOTE — Telephone Encounter (Signed)
Returned patient call to see if they what type of symptoms that she was experiencing now. SW

## 2015-06-03 ENCOUNTER — Ambulatory Visit (INDEPENDENT_AMBULATORY_CARE_PROVIDER_SITE_OTHER): Payer: BLUE CROSS/BLUE SHIELD | Admitting: Family Medicine

## 2015-06-03 ENCOUNTER — Encounter: Payer: Self-pay | Admitting: Family Medicine

## 2015-06-03 VITALS — BP 134/92 | HR 80 | Temp 98.2°F | Ht 65.0 in | Wt 295.2 lb

## 2015-06-03 DIAGNOSIS — Z833 Family history of diabetes mellitus: Secondary | ICD-10-CM | POA: Diagnosis not present

## 2015-06-03 DIAGNOSIS — E538 Deficiency of other specified B group vitamins: Secondary | ICD-10-CM | POA: Diagnosis not present

## 2015-06-03 DIAGNOSIS — R350 Frequency of micturition: Secondary | ICD-10-CM

## 2015-06-03 LAB — POCT URINALYSIS DIPSTICK
Bilirubin, UA: NEGATIVE
Blood, UA: NEGATIVE
GLUCOSE UA: NEGATIVE
Ketones, UA: NEGATIVE
LEUKOCYTES UA: NEGATIVE
Nitrite, UA: NEGATIVE
Protein, UA: NEGATIVE
SPEC GRAV UA: 1.015
Urobilinogen, UA: 2
pH, UA: 6

## 2015-06-03 NOTE — Patient Instructions (Signed)

## 2015-06-03 NOTE — Progress Notes (Signed)
Pre visit review using our clinic review tool, if applicable. No additional management support is needed unless otherwise documented below in the visit note. 

## 2015-06-03 NOTE — Progress Notes (Signed)
Patient ID: Jody [name], female   DOB: 15-Feb-1971, 44 y.o.   MRN: [id number]   Subjective:    Patient ID: Jody [name], female    DOB: 03/21/1971, 44 y.o.   MRN: [id number]  Chief Complaint  Patient presents with  . Fatigue    Nausea and Headaches x's a few weeks  . Urinary Frequency    x's 1 mo. Patient is concerned about having diabetes    HPI Patient is in today with complaint urinary frequency.  She has a family hx of diabetes and she is very concerned.       Past Medical History  Diagnosis Date  . Migraines   . Depression   . History of anal fissures   . Anemia   . Anxiety   . History of gallstones   . Obesity   . Asthma   . GERD (gastroesophageal reflux disease)     Past Surgical History  Procedure Laterality Date  . Cholecystectomy    . Tubal ligation      Essure  . Flexible sigmoidoscopy  03/09/2012    Procedure: FLEXIBLE SIGMOIDOSCOPY;  Surgeon: Inda Castle, MD;  Location: [facility];  Service: Endoscopy;  Laterality: N/A;    Family History  Problem Relation Age of Onset  . Breast cancer Mother   . Coronary artery disease Maternal Grandmother   . Obesity    . Cataracts Maternal Grandmother   . Glaucoma Maternal Grandmother   . Hypertension Mother   . Hepatitis Father 59    Hep C  . Diabetes Father   . Arthritis Father   . Hypertension Father   . Heart disease Maternal Grandmother   . Kidney disease Father     Social History   Social History  . Marital Status: Single    Spouse Name: N/A  . Number of Children: 3  . Years of Education: N/A   Occupational History  . disability    Social History Main Topics  . Smoking status: Never Smoker   . Smokeless tobacco: Never Used  . Alcohol Use: No  . Drug Use: No  . Sexual Activity:    Partners: Male   Other Topics Concern  . Not on file   Social History Narrative   Exercise-- trying to do some steps    Outpatient Prescriptions Prior to Visit  Medication Sig Dispense Refill    . esomeprazole (NEXIUM) 40 MG capsule Take 1 capsule (40 mg total) by mouth daily. (Patient not taking: Reported on 06/03/2015) 30 capsule 11  . topiramate (TOPAMAX) 50 MG tablet Take 1 tablet (50 mg total) by mouth 2 (two) times daily. (Patient not taking: Reported on 06/03/2015) 60 tablet 5   No facility-administered medications prior to visit.    No Known Allergies  Review of Systems  Constitutional: Positive for malaise/fatigue. Negative for fever.  HENT: Negative for congestion.   Eyes: Negative for discharge.  Respiratory: Negative for shortness of breath.   Cardiovascular: Negative for chest pain, palpitations and leg swelling.  Gastrointestinal: Negative for nausea and abdominal pain.  Genitourinary: Negative for dysuria.  Musculoskeletal: Negative for falls.  Neurological: Positive for headaches. Negative for loss of consciousness.  Endo/Heme/Allergies: Negative for environmental allergies.  Psychiatric/Behavioral: Negative for depression. The patient is not nervous/anxious.        Objective:    Physical Exam  Constitutional: She is oriented to person, place, and time. She appears well-developed and well-nourished.  HENT:  Head: Normocephalic and atraumatic.  Eyes: Conjunctivae  and EOM are normal.  Neck: Normal range of motion. Neck supple. No JVD present. Carotid bruit is not present. No thyromegaly present.  Cardiovascular: Normal rate, regular rhythm and normal heart sounds.   No murmur heard. Pulmonary/Chest: Effort normal and breath sounds normal. No respiratory distress. She has no wheezes. She has no rales. She exhibits no tenderness.  Musculoskeletal: She exhibits no edema.  Neurological: She is alert and oriented to person, place, and time.  Psychiatric: She has a normal mood and affect. Her behavior is normal.    BP 144/92 mmHg  Pulse 80  Temp(Src) 98.2 F (36.8 C) (Oral)  Ht 5' 5"  (1.651 m)  Wt 295 lb 3.2 oz (133.902 kg)  BMI 49.12 kg/m2  SpO2 99%   LMP 06/02/2015 Wt Readings from Last 3 Encounters:  06/03/15 295 lb 3.2 oz (133.902 kg)  06/13/13 307 lb 6.4 oz (139.436 kg)  03/04/12 305 lb 6 oz (138.517 kg)     Lab Results  Component Value Date   WBC 9.3 06/13/2013   HGB 12.1 06/13/2013   HCT 36.9 06/13/2013   PLT 233.0 06/13/2013   GLUCOSE 75 06/13/2013   CHOL 141 06/13/2013   TRIG 80.0 06/13/2013   HDL 44.80 06/13/2013   LDLCALC 80 06/13/2013   ALT 14 06/13/2013   AST 23 06/13/2013   NA 136 06/13/2013   K 3.5 06/13/2013   CL 102 06/13/2013   CREATININE 0.8 06/13/2013   BUN 9 06/13/2013   CO2 25 06/13/2013   TSH 0.88 06/13/2013   INR 1.03 12/11/2010   HGBA1C * 12/12/2010    5.8 (NOTE)                                                                       According to the ADA Clinical Practice Recommendations for 2011, when HbA1c is used as a screening test:   >=6.5%   Diagnostic of Diabetes Mellitus           (if abnormal result  is confirmed)  5.7-6.4%   Increased risk of developing Diabetes Mellitus  References:Diagnosis and Classification of Diabetes Mellitus,Diabetes DPOE,4235,36(RWERX 1):S62-S69 and Standards of Medical Care in         Diabetes - 2011,Diabetes Care,2011,34  (Suppl 1):S11-S61.    Lab Results  Component Value Date   TSH 0.88 06/13/2013   Lab Results  Component Value Date   WBC 9.3 06/13/2013   HGB 12.1 06/13/2013   HCT 36.9 06/13/2013   MCV 81.6 06/13/2013   PLT 233.0 06/13/2013   Lab Results  Component Value Date   NA 136 06/13/2013   K 3.5 06/13/2013   CO2 25 06/13/2013   GLUCOSE 75 06/13/2013   BUN 9 06/13/2013   CREATININE 0.8 06/13/2013   BILITOT 0.5 06/13/2013   ALKPHOS 45 06/13/2013   AST 23 06/13/2013   ALT 14 06/13/2013   PROT 7.2 06/13/2013   ALBUMIN 3.5 06/13/2013   CALCIUM 8.9 06/13/2013   GFR 108.27 06/13/2013   Lab Results  Component Value Date   CHOL 141 06/13/2013   Lab Results  Component Value Date   HDL 44.80 06/13/2013   Lab Results  Component Value  Date   LDLCALC 80 06/13/2013   Lab Results  Component  Value Date   TRIG 80.0 06/13/2013   Lab Results  Component Value Date   CHOLHDL 3 06/13/2013   Lab Results  Component Value Date   HGBA1C * 12/12/2010    5.8 (NOTE)                                                                       According to the ADA Clinical Practice Recommendations for 2011, when HbA1c is used as a screening test:   >=6.5%   Diagnostic of Diabetes Mellitus           (if abnormal result  is confirmed)  5.7-6.4%   Increased risk of developing Diabetes Mellitus  References:Diagnosis and Classification of Diabetes Mellitus,Diabetes WNIO,2703,50(KXFGH 1):S62-S69 and Standards of Medical Care in         Diabetes - 2011,Diabetes WEXH,3716,96  (Suppl 1):S11-S61.       Assessment & Plan:   Problem List Items Addressed This Visit    None    Visit Diagnoses    Urine frequency    -  Primary    Relevant Orders    POCT Urinalysis Dipstick (Completed)    Comp Met (CMET)    Hemoglobin A1c    Lipid panel    Microalbumin / creatinine urine ratio    Family history of diabetes mellitus        Relevant Orders    Comp Met (CMET)    Hemoglobin A1c    Lipid panel    Microalbumin / creatinine urine ratio       I have discontinued Ms. Mccambridge's esomeprazole and topiramate.  No orders of the defined types were placed in this encounter.     Elizabeth Sauer, LPN

## 2015-06-03 NOTE — Assessment & Plan Note (Signed)
Discussed diet and exercise 

## 2015-06-04 LAB — COMPREHENSIVE METABOLIC PANEL
ALK PHOS: 47 U/L (ref 39–117)
ALT: 8 U/L (ref 0–35)
AST: 13 U/L (ref 0–37)
Albumin: 3.5 g/dL (ref 3.5–5.2)
BILIRUBIN TOTAL: 0.5 mg/dL (ref 0.2–1.2)
BUN: 9 mg/dL (ref 6–23)
CO2: 25 meq/L (ref 19–32)
CREATININE: 0.77 mg/dL (ref 0.40–1.20)
Calcium: 8.8 mg/dL (ref 8.4–10.5)
Chloride: 104 mEq/L (ref 96–112)
GFR: 105.62 mL/min (ref >60.00)
GLUCOSE: 76 mg/dL (ref 70–99)
Potassium: 3.5 mEq/L (ref 3.5–5.1)
Sodium: 138 mEq/L (ref 135–145)
TOTAL PROTEIN: 7.1 g/dL (ref 6.0–8.3)

## 2015-06-04 LAB — MICROALBUMIN / CREATININE URINE RATIO
Creatinine,U: 84.6 mg/dL
MICROALB/CREAT RATIO: 0.8 mg/g (ref 0.0–30.0)

## 2015-06-04 LAB — LIPID PANEL
CHOL/HDL RATIO: 3
Cholesterol: 126 mg/dL (ref 0–200)
HDL: 40.4 mg/dL (ref >39.00)
LDL CALC: 76 mg/dL (ref 0–99)
NONHDL: 85.3
TRIGLYCERIDES: 47 mg/dL (ref 0.0–149.0)
VLDL: 9.4 mg/dL (ref 0.0–40.0)

## 2015-06-04 LAB — HEMOGLOBIN A1C: Hgb A1c MFr Bld: 5.9 % (ref 4.6–6.5)

## 2015-06-04 LAB — VITAMIN B12: Vitamin B-12: 367 pg/mL (ref 211–911)

## 2015-06-06 ENCOUNTER — Encounter: Payer: Self-pay | Admitting: Family Medicine

## 2015-06-18 ENCOUNTER — Encounter: Payer: Self-pay | Admitting: Family Medicine

## 2015-06-18 ENCOUNTER — Ambulatory Visit (INDEPENDENT_AMBULATORY_CARE_PROVIDER_SITE_OTHER): Payer: BLUE CROSS/BLUE SHIELD | Admitting: Family Medicine

## 2015-06-18 VITALS — BP 140/88 | HR 95 | Temp 98.4°F | Wt 296.0 lb

## 2015-06-18 DIAGNOSIS — I10 Essential (primary) hypertension: Secondary | ICD-10-CM | POA: Diagnosis not present

## 2015-06-18 DIAGNOSIS — R739 Hyperglycemia, unspecified: Secondary | ICD-10-CM | POA: Diagnosis not present

## 2015-06-18 MED ORDER — HYDROCHLOROTHIAZIDE 25 MG PO TABS: 25.0000 mg | ORAL_TABLET | Freq: Every day | ORAL | Status: DC

## 2015-06-18 NOTE — Patient Instructions (Signed)

## 2015-06-18 NOTE — Assessment & Plan Note (Signed)
hctz 25 mg qd Watch salt intake Recheck 2-3 weeks

## 2015-06-18 NOTE — Progress Notes (Signed)
Patient ID: Jody [name], female    DOB: 02/15/1971  Age: 44 y.o. MRN: [id number]    Subjective:  Subjective HPI JAILANI HOGANS presents for f/u bp  Review of Systems  Constitutional: Negative for diaphoresis, appetite change, fatigue and unexpected weight change.  Eyes: Negative for pain, redness and visual disturbance.  Respiratory: Negative for cough, chest tightness, shortness of breath and wheezing.   Cardiovascular: Negative for chest pain, palpitations and leg swelling.  Endocrine: Negative for cold intolerance, heat intolerance, polydipsia, polyphagia and polyuria.  Genitourinary: Negative for dysuria, frequency and difficulty urinating.  Neurological: Negative for dizziness, light-headedness, numbness and headaches.  All other systems reviewed and are negative.   History Past Medical History  Diagnosis Date  . Migraines   . Depression   . History of anal fissures   . Anemia   . Anxiety   . History of gallstones   . Obesity   . Asthma   . GERD (gastroesophageal reflux disease)     She has past surgical history that includes Cholecystectomy; Tubal ligation; and Flexible sigmoidoscopy (03/09/2012).   Her family history includes Arthritis in her father; Breast cancer in her mother; Cataracts in her maternal grandmother; Coronary artery disease in her maternal grandmother; Diabetes in her father; Glaucoma in her maternal grandmother; Heart disease in her maternal grandmother; Hepatitis (age of onset: 61) in her father; Hypertension in her father and mother; Kidney disease in her father; Obesity in an other family member.She reports that she has never smoked. She has never used smokeless tobacco. She reports that she does not drink alcohol or use illicit drugs.  No current outpatient prescriptions on file prior to visit.   No current facility-administered medications on file prior to visit.     Objective:  Objective Physical Exam  Constitutional: She is oriented  to person, place, and time. She appears well-developed and well-nourished.  HENT:  Head: Normocephalic and atraumatic.  Eyes: Conjunctivae and EOM are normal.  Neck: Normal range of motion. Neck supple. No JVD present. Carotid bruit is not present. No thyromegaly present.  Cardiovascular: Normal rate, regular rhythm and normal heart sounds.   No murmur heard. Pulmonary/Chest: Effort normal and breath sounds normal. No respiratory distress. She has no wheezes. She has no rales. She exhibits no tenderness.  Musculoskeletal: She exhibits no edema.  Neurological: She is alert and oriented to person, place, and time.  Psychiatric: She has a normal mood and affect. Her behavior is normal. Judgment and thought content normal.  Nursing note and vitals reviewed.  BP 140/88 mmHg  Pulse 95  Temp(Src) 98.4 F (36.9 C) (Oral)  Wt 296 lb (134.265 kg)  SpO2 96%  LMP 06/02/2015 Wt Readings from Last 3 Encounters:  06/18/15 296 lb (134.265 kg)  06/03/15 295 lb 3.2 oz (133.902 kg)  06/13/13 307 lb 6.4 oz (139.436 kg)     Lab Results  Component Value Date   WBC 9.3 06/13/2013   HGB 12.1 06/13/2013   HCT 36.9 06/13/2013   PLT 233.0 06/13/2013   GLUCOSE 76 06/03/2015   CHOL 126 06/03/2015   TRIG 47.0 06/03/2015   HDL 40.40 06/03/2015   LDLCALC 76 06/03/2015   ALT 8 06/03/2015   AST 13 06/03/2015   NA 138 06/03/2015   K 3.5 06/03/2015   CL 104 06/03/2015   CREATININE 0.77 06/03/2015   BUN 9 06/03/2015   CO2 25 06/03/2015   TSH 0.88 06/13/2013   INR 1.03 12/11/2010   HGBA1C 5.9 06/03/2015  MICROALBUR <0.7 06/03/2015    Mm Digital Diag Ltd L  07/03/2013   CLINICAL DATA:  Patient presents for additional views of the left breast as followup to recent screening exam to evaluate a possible mass.  EXAM: DIGITAL DIAGNOSTIC  left MAMMOGRAM  COMPARISON:  Previous exams.  ACR Breast Density Category a: The breast tissue is almost entirely fatty.  FINDINGS: Spot compression and true lateral  images demonstrate no focal abnormality over the inner left breast. Screening density is likely due to summation artifact.  IMPRESSION: No focal abnormality over the inner left breast.  RECOMMENDATION: Recommend continued annual bilateral screening mammographic followup.  I have discussed the findings and recommendations with the patient. Results were also provided in writing at the conclusion of the visit. If applicable, a reminder letter will be sent to the patient regarding the next appointment.  BI-RADS CATEGORY  1: Negative   Electronically Signed   By: Elberta Fortis M.D.   On: 07/03/2013 12:48     Assessment & Plan:  Plan I am having Ms. Baggett start on hydrochlorothiazide.  Meds ordered this encounter  Medications  . hydrochlorothiazide (HYDRODIURIL) 25 MG tablet    Sig: Take 1 tablet (25 mg total) by mouth daily. 1 po qd prn    Dispense:  30 tablet    Refill:  11    Problem List Items Addressed This Visit    Hyperglycemia    Lab Results  Component Value Date   HGBA1C 5.9 06/03/2015   Creeping up--- watch simple sugars and starches Diet and exercise discussed Recheck 3 months      HTN (hypertension) - Primary    hctz 25 mg qd Watch salt intake Recheck 2-3 weeks      Relevant Medications   hydrochlorothiazide (HYDRODIURIL) 25 MG tablet      Follow-up: Return in about 2 weeks (around 07/02/2015), or if symptoms worsen or fail to improve, for hypertension.  Loreen Freud, DO

## 2015-06-18 NOTE — Assessment & Plan Note (Signed)
Lab Results  Component Value Date   HGBA1C 5.9 06/03/2015   Creeping up--- watch simple sugars and starches Diet and exercise discussed Recheck 3 months

## 2015-06-18 NOTE — Progress Notes (Signed)
Pre visit review using our clinic review tool, if applicable. No additional management support is needed unless otherwise documented below in the visit note. 

## 2015-06-28 ENCOUNTER — Encounter: Payer: Self-pay | Admitting: Family Medicine

## 2015-06-28 ENCOUNTER — Other Ambulatory Visit: Payer: Self-pay

## 2015-06-28 MED ORDER — METOPROLOL SUCCINATE ER 50 MG PO TB24: 50.0000 mg | ORAL_TABLET | Freq: Every day | ORAL | Status: DC

## 2015-06-28 NOTE — Telephone Encounter (Signed)
I have tried to call the patient but her mailbox was full.      KP

## 2015-06-28 NOTE — Telephone Encounter (Signed)
Add toprol 50 mg #30  1 po qd ,  2 refills--- ov 2-3 weeks

## 2015-07-17 ENCOUNTER — Encounter: Payer: Self-pay | Admitting: Family Medicine

## 2015-07-17 ENCOUNTER — Other Ambulatory Visit: Payer: Self-pay

## 2015-07-17 DIAGNOSIS — Z1231 Encounter for screening mammogram for malignant neoplasm of breast: Secondary | ICD-10-CM

## 2015-07-22 NOTE — Telephone Encounter (Signed)
Patient scheduled for nurse visit on 07/23/15 at 3:45 PM for B/P & Pulse check.

## 2015-07-22 NOTE — Telephone Encounter (Signed)
Is she able to come by here for a nurse visit for bp and pulse check?

## 2015-07-23 ENCOUNTER — Ambulatory Visit (INDEPENDENT_AMBULATORY_CARE_PROVIDER_SITE_OTHER): Payer: BLUE CROSS/BLUE SHIELD | Admitting: Family Medicine

## 2015-07-23 VITALS — BP 143/91 | HR 103

## 2015-07-23 DIAGNOSIS — Z0130 Encounter for examination of blood pressure without abnormal findings: Secondary | ICD-10-CM

## 2015-07-23 DIAGNOSIS — I10 Essential (primary) hypertension: Secondary | ICD-10-CM | POA: Diagnosis not present

## 2015-07-23 DIAGNOSIS — Z136 Encounter for screening for cardiovascular disorders: Secondary | ICD-10-CM

## 2015-07-23 MED ORDER — METOPROLOL SUCCINATE ER 100 MG PO TB24: 100.0000 mg | ORAL_TABLET | Freq: Every day | ORAL | Status: DC

## 2015-07-23 NOTE — Progress Notes (Signed)
Pre visit review using our clinic review tool, if applicable. No additional management support is needed unless otherwise documented below in the visit note.  Patient presents today for a blood pressure and pulse check per the patient email note on 07/17/15.  Readings are as follow: B/P 162/89 P 104                   B/P 143/91 P 103  Per Dr. Laury AxonLowne: Continue to take Hydrochlorothiazide 25 mg daily, but increase Metoprolol Succinate to 100 mg daily and follow-up with her. Patient understood instructions and confirmed office visit appointment for this Thursday, 07/25/15 at 3:30 PM. New prescription sent to the patient's preferred pharmacy for the Metoprolol Succinate.

## 2015-07-24 ENCOUNTER — Telehealth: Payer: Self-pay

## 2015-07-25 ENCOUNTER — Encounter: Payer: Self-pay | Admitting: Family Medicine

## 2015-07-25 NOTE — Assessment & Plan Note (Addendum)
Metoprolol inc  con't meds Keep ov

## 2015-07-25 NOTE — Telephone Encounter (Signed)
Pre visit call completed 

## 2015-07-25 NOTE — Progress Notes (Signed)
Patient ID: Lenoria Farrieriphany S Roston, female    DOB: 11/29/1970  Age: 44 y.o. MRN: [id number]    Subjective:  Subjective HPI Lenoria Farrieriphany S Manke presents for f/u bp.  No cp, no sob.  bp was running high.    Review of Systems    History Past Medical History  Diagnosis Date  . Migraines   . Depression   . History of anal fissures   . Anemia   . Anxiety   . History of gallstones   . Obesity   . Asthma   . GERD (gastroesophageal reflux disease)     She has past surgical history that includes Cholecystectomy; Tubal ligation; and Flexible sigmoidoscopy (03/09/2012).   Her family history includes Arthritis in her father; Breast cancer in her mother; Cataracts in her maternal grandmother; Coronary artery disease in her maternal grandmother; Diabetes in her father; Glaucoma in her maternal grandmother; Heart disease in her maternal grandmother; Hepatitis (age of onset: 5851) in her father; Hypertension in her father and mother; Kidney disease in her father; Obesity in an other family member.She reports that she has never smoked. She has never used smokeless tobacco. She reports that she does not drink alcohol or use illicit drugs.  Current Outpatient Prescriptions on File Prior to Visit  Medication Sig Dispense Refill  . hydrochlorothiazide (HYDRODIURIL) 25 MG tablet Take 1 tablet (25 mg total) by mouth daily. 1 po qd prn 30 tablet 11   No current facility-administered medications on file prior to visit.     Objective:  Objective Physical Exam  Nursing note and vitals reviewed.  BP 143/91 mmHg  Pulse 103 Wt Readings from Last 3 Encounters:  06/18/15 296 lb (134.265 kg)  06/03/15 295 lb 3.2 oz (133.902 kg)  06/13/13 307 lb 6.4 oz (139.436 kg)     Lab Results  Component Value Date   WBC 9.3 06/13/2013   HGB 12.1 06/13/2013   HCT 36.9 06/13/2013   PLT 233.0 06/13/2013   GLUCOSE 76 06/03/2015   CHOL 126 06/03/2015   TRIG 47.0 06/03/2015   HDL 40.40 06/03/2015   LDLCALC 76 06/03/2015     ALT 8 06/03/2015   AST 13 06/03/2015   NA 138 06/03/2015   K 3.5 06/03/2015   CL 104 06/03/2015   CREATININE 0.77 06/03/2015   BUN 9 06/03/2015   CO2 25 06/03/2015   TSH 0.88 06/13/2013   INR 1.03 12/11/2010   HGBA1C 5.9 06/03/2015   MICROALBUR <0.7 06/03/2015    Mm Digital Diag Ltd L  07/03/2013  CLINICAL DATA:  Patient presents for additional views of the left breast as followup to recent screening exam to evaluate a possible mass. EXAM: DIGITAL DIAGNOSTIC  left MAMMOGRAM COMPARISON:  Previous exams. ACR Breast Density Category a: The breast tissue is almost entirely fatty. FINDINGS: Spot compression and true lateral images demonstrate no focal abnormality over the inner left breast. Screening density is likely due to summation artifact. IMPRESSION: No focal abnormality over the inner left breast. RECOMMENDATION: Recommend continued annual bilateral screening mammographic followup. I have discussed the findings and recommendations with the patient. Results were also provided in writing at the conclusion of the visit. If applicable, a reminder letter will be sent to the patient regarding the next appointment. BI-RADS CATEGORY  1: Negative Electronically Signed   By: Elberta Fortisaniel  Boyle M.D.   On: 07/03/2013 12:48     Assessment & Plan:  Plan I have changed Ms. Rosenbach's metoprolol succinate. I am also having her maintain her  hydrochlorothiazide.  Meds ordered this encounter  Medications  . metoprolol succinate (TOPROL-XL) 100 MG 24 hr tablet    Sig: Take 1 tablet (100 mg total) by mouth daily. Take with or immediately following a meal.    Dispense:  30 tablet    Refill:  2    D/C PREVIOUS SCRIPTS FOR THIS MEDICATION    Problem List Items Addressed This Visit    HTN (hypertension)    Metoprolol inc  con't meds Recheck 4-6 weeks      Relevant Medications   metoprolol succinate (TOPROL-XL) 100 MG 24 hr tablet    Other Visit Diagnoses    Blood pressure check    -  Primary        Follow-up: No Follow-up on file.  Loreen Freud, DO

## 2015-08-02 ENCOUNTER — Ambulatory Visit
Admission: RE | Admit: 2015-08-02 | Discharge: 2015-08-02 | Disposition: A | Discharge status: Home / Self Care | Payer: BLUE CROSS/BLUE SHIELD | Source: Ambulatory Visit

## 2015-08-02 DIAGNOSIS — Z1231 Encounter for screening mammogram for malignant neoplasm of breast: Secondary | ICD-10-CM

## 2016-06-21 ENCOUNTER — Other Ambulatory Visit: Payer: Self-pay | Admitting: Family Medicine

## 2016-06-21 DIAGNOSIS — I10 Essential (primary) hypertension: Secondary | ICD-10-CM

## 2017-03-04 ENCOUNTER — Ambulatory Visit: Payer: BLUE CROSS/BLUE SHIELD | Admitting: Family Medicine

## 2017-03-04 DIAGNOSIS — Z0289 Encounter for other administrative examinations: Secondary | ICD-10-CM

## 2017-03-05 ENCOUNTER — Encounter: Payer: Self-pay | Admitting: Family Medicine

## 2017-03-05 ENCOUNTER — Ambulatory Visit (INDEPENDENT_AMBULATORY_CARE_PROVIDER_SITE_OTHER): Payer: 59 | Admitting: Family Medicine

## 2017-03-05 VITALS — BP 138/98 | HR 88 | Temp 98.1°F | Resp 14 | Ht 65.0 in | Wt 297.0 lb

## 2017-03-05 DIAGNOSIS — L0292 Furuncle, unspecified: Secondary | ICD-10-CM | POA: Diagnosis not present

## 2017-03-05 DIAGNOSIS — L739 Follicular disorder, unspecified: Secondary | ICD-10-CM

## 2017-03-05 MED ORDER — DOXYCYCLINE HYCLATE 100 MG PO TABS: 100.0000 mg | ORAL_TABLET | Freq: Two times a day (BID) | ORAL | 0 refills | Status: DC

## 2017-03-05 NOTE — Progress Notes (Signed)
Patient ID: Jody [name], female    DOB: 07/21/1971  Age: 46 y.o. MRN: [id number]    Subjective:  Subjective  HPI Jody Farrieriphany S Oubre presents for bumps on her stomach that are spreading and some painful.  No other symptoms  Review of Systems  Constitutional: Negative for activity change, appetite change, fatigue and unexpected weight change.  Respiratory: Negative for cough and shortness of breath.   Cardiovascular: Negative for chest pain and palpitations.  Psychiatric/Behavioral: Negative for behavioral problems and dysphoric mood. The patient is not nervous/anxious.     History Past Medical History:  Diagnosis Date  . Anemia   . Anxiety   . Asthma   . Depression   . GERD (gastroesophageal reflux disease)   . History of anal fissures   . History of gallstones   . Migraines   . Obesity     She has a past surgical history that includes Cholecystectomy; Tubal ligation; and Flexible sigmoidoscopy (03/09/2012).   Her family history includes Arthritis in her father; Breast cancer in her mother; Cataracts in her maternal grandmother; Coronary artery disease in her maternal grandmother; Diabetes in her father; Glaucoma in her maternal grandmother; Heart disease in her maternal grandmother; Hepatitis (age of onset: 3151) in her father; Hypertension in her father and mother; Kidney disease in her father.She reports that she has never smoked. She has never used smokeless tobacco. She reports that she does not drink alcohol or use drugs.  Current Outpatient Prescriptions on File Prior to Visit  Medication Sig Dispense Refill  . hydrochlorothiazide (HYDRODIURIL) 25 MG tablet TAKE 1 TABLET (25 MG TOTAL) BY MOUTH DAILY AS NEEDED 30 tablet 9  . metoprolol succinate (TOPROL-XL) 100 MG 24 hr tablet Take 1 tablet (100 mg total) by mouth daily. Take with or immediately following a meal. (Patient not taking: Reported on 03/05/2017) 30 tablet 2   No current facility-administered medications on file  prior to visit.      Objective:  Objective  Physical Exam  Constitutional: She is oriented to person, place, and time. She appears well-developed and well-nourished.  HENT:  Head: Normocephalic and atraumatic.  Eyes: Conjunctivae and EOM are normal.  Neck: Normal range of motion. Neck supple. No JVD present. Carotid bruit is not present. No thyromegaly present.  Cardiovascular: Normal rate, regular rhythm and normal heart sounds.   No murmur heard. Pulmonary/Chest: Effort normal and breath sounds normal. No respiratory distress. She has no wheezes. She has no rales. She exhibits no tenderness.  Musculoskeletal: She exhibits no edema.  Neurological: She is alert and oriented to person, place, and time.  Skin:     Psychiatric: She has a normal mood and affect.  Nursing note and vitals reviewed.  BP (!) 138/98 (BP Location: Left Arm, Patient Position: Sitting, Cuff Size: Normal)   Pulse 88   Temp 98.1 F (36.7 C) (Oral)   Resp 14   Ht 5\' 5"  (1.651 m)   Wt 297 lb (134.7 kg)   LMP 03/02/2017 (Exact Date)   SpO2 98%   BMI 49.42 kg/m  Wt Readings from Last 3 Encounters:  03/05/17 297 lb (134.7 kg)  06/18/15 296 lb (134.3 kg)  06/03/15 295 lb 3.2 oz (133.9 kg)     Lab Results  Component Value Date   WBC 9.3 06/13/2013   HGB 12.1 06/13/2013   HCT 36.9 06/13/2013   PLT 233.0 06/13/2013   GLUCOSE 76 06/03/2015   CHOL 126 06/03/2015   TRIG 47.0 06/03/2015   HDL  40.40 06/03/2015   LDLCALC 76 06/03/2015   ALT 8 06/03/2015   AST 13 06/03/2015   NA 138 06/03/2015   K 3.5 06/03/2015   CL 104 06/03/2015   CREATININE 0.77 06/03/2015   BUN 9 06/03/2015   CO2 25 06/03/2015   TSH 0.88 06/13/2013   INR 1.03 12/11/2010   HGBA1C 5.9 06/03/2015   MICROALBUR <0.7 06/03/2015    Mm Digital Screening Bilateral  Result Date: 08/02/2015 CLINICAL DATA:  Screening. EXAM: DIGITAL SCREENING BILATERAL MAMMOGRAM WITH CAD COMPARISON:  Previous exam(s). ACR Breast Density Category b:  There are scattered areas of fibroglandular density. FINDINGS: There are no findings suspicious for malignancy. Images were processed with CAD. IMPRESSION: No mammographic evidence of malignancy. A result letter of this screening mammogram will be mailed directly to the patient. RECOMMENDATION: Screening mammogram in one year. (Code:[id number]) BI-RADS CATEGORY  1: Negative. Electronically Signed   By: Ted Mcalpine M.D.   On: 08/05/2015 08:48     Assessment & Plan:  Plan  I am having Ms. Kaczmarczyk start on doxycycline. I am also having her maintain her metoprolol succinate and hydrochlorothiazide.  Meds ordered this encounter  Medications  . doxycycline (VIBRA-TABS) 100 MG tablet    Sig: Take 1 tablet (100 mg total) by mouth 2 (two) times daily.    Dispense:  20 tablet    Refill:  0    Problem List Items Addressed This Visit    None    Visit Diagnoses    Boils    -  Primary   Relevant Medications   doxycycline (VIBRA-TABS) 100 MG tablet   Other Relevant Orders   WOUND CULTURE   Folliculitis       Relevant Medications   doxycycline (VIBRA-TABS) 100 MG tablet    epson salt soaks/ bath   Follow-up: Return if symptoms worsen or fail to improve.  Donato Schultz, DO    Pre visit review using our clinic review tool, if applicable. No additional management support is needed unless otherwise documented below in the visit note.

## 2017-03-05 NOTE — Patient Instructions (Signed)

## 2017-03-08 LAB — WOUND CULTURE
GRAM STAIN: NONE SEEN
GRAM STAIN: NONE SEEN
Gram Stain: NONE SEEN
Organism ID, Bacteria: NO GROWTH

## 2017-04-27 ENCOUNTER — Ambulatory Visit: Payer: 59 | Admitting: Family Medicine

## 2017-04-27 ENCOUNTER — Telehealth: Payer: Self-pay | Admitting: Family Medicine

## 2017-04-27 NOTE — Telephone Encounter (Signed)
No charge. 

## 2017-04-27 NOTE — Telephone Encounter (Signed)
Pt called in to reschedule her apt she was scheduled for today at 2:45p. Pt says that she was in a meeting discussing her fathers burial and didn't realize the time. Pt has been rescheduled to Thursday.

## 2017-04-29 ENCOUNTER — Ambulatory Visit (INDEPENDENT_AMBULATORY_CARE_PROVIDER_SITE_OTHER): Payer: 59 | Admitting: Family Medicine

## 2017-04-29 ENCOUNTER — Encounter: Payer: Self-pay | Admitting: Family Medicine

## 2017-04-29 VITALS — BP 144/80 | HR 87 | Temp 98.8°F | Ht 66.0 in | Wt 300.5 lb

## 2017-04-29 DIAGNOSIS — F430 Acute stress reaction: Secondary | ICD-10-CM

## 2017-04-29 MED ORDER — LORAZEPAM 0.5 MG PO TABS: 0.5000 mg | ORAL_TABLET | Freq: Two times a day (BID) | ORAL | 1 refills | Status: DC | PRN

## 2017-04-29 NOTE — Progress Notes (Signed)
Pre visit review using our clinic review tool, if applicable. No additional management support is needed unless otherwise documented below in the visit note. 

## 2017-04-29 NOTE — Patient Instructions (Signed)

## 2017-04-29 NOTE — Progress Notes (Signed)
Patient ID: Jody [name], female    DOB: [birth year]  Age: 46 y.o. MRN: [id number]    Subjective:  Subjective  HPI Jody [name] presents for headaches that started after her dad passed away 02-11-2023.  Pain back of head and R trap.  Her anxiety is worsening as well.  She has some famliy but she is an only child   Review of Systems  Constitutional: Positive for fatigue. Negative for activity change, appetite change and unexpected weight change.  Respiratory: Negative for cough and shortness of breath.   Cardiovascular: Negative for chest pain and palpitations.  Neurological: Positive for headaches.  Psychiatric/Behavioral: Negative for behavioral problems and dysphoric mood. The patient is not nervous/anxious.     History Past Medical History:  Diagnosis Date  . Anemia   . Anxiety   . Asthma   . Depression   . GERD (gastroesophageal reflux disease)   . History of anal fissures   . History of gallstones   . Migraines   . Obesity     She has a past surgical history that includes Cholecystectomy; Tubal ligation; and Flexible sigmoidoscopy (03/09/2012).   Her family history includes Arthritis in her father; Breast cancer in her mother; Cataracts in her maternal grandmother; Coronary artery disease in her maternal grandmother; Diabetes in her father; Glaucoma in her maternal grandmother; Heart attack (age of onset: 41) in her father; Heart disease in her maternal grandmother; Hepatitis (age of onset: 38) in her father; Hypertension in her father and mother; Kidney disease in her father; Obesity in her unknown relative.She reports that she has never smoked. She has never used smokeless tobacco. She reports that she does not drink alcohol or use drugs.  Current Outpatient Prescriptions on File Prior to Visit  Medication Sig Dispense Refill  . hydrochlorothiazide (HYDRODIURIL) 25 MG tablet TAKE 1 TABLET (25 MG TOTAL) BY MOUTH DAILY AS NEEDED 30 tablet 9  . metoprolol succinate (TOPROL-XL)  100 MG 24 hr tablet Take 1 tablet (100 mg total) by mouth daily. Take with or immediately following a meal. (Patient not taking: Reported on 04/29/2017) 30 tablet 2   No current facility-administered medications on file prior to visit.      Objective:  Objective  Physical Exam  Constitutional: She is oriented to person, place, and time. She appears well-developed and well-nourished.  HENT:  Head: Normocephalic and atraumatic.  Eyes: Conjunctivae and EOM are normal.  Neck: Normal range of motion. Neck supple. No JVD present. Carotid bruit is not present. No thyromegaly present.  Cardiovascular: Normal rate, regular rhythm and normal heart sounds.   No murmur heard. Pulmonary/Chest: Effort normal and breath sounds normal. No respiratory distress. She has no wheezes. She has no rales. She exhibits no tenderness.  Musculoskeletal: She exhibits no edema.       Arms: Neurological: She is alert and oriented to person, place, and time.  Psychiatric: Her speech is normal and behavior is normal. Judgment and thought content normal. Her mood appears anxious. Cognition and memory are normal. She exhibits a depressed mood.  Nursing note and vitals reviewed.  BP (!) 144/80 (BP Location: Left Arm, Patient Position: Sitting, Cuff Size: Large)   Pulse 87   Temp 98.8 F (37.1 C) (Oral)   Ht 5\' 6"  (1.676 m)   Wt (!) 300 lb 8 oz (136.3 kg)   SpO2 96%   BMI 48.50 kg/m  Wt Readings from Last 3 Encounters:  04/29/17 (!) 300 lb 8 oz (136.3 kg)  03/05/17  297 lb (134.7 kg)  06/18/15 296 lb (134.3 kg)     Lab Results  Component Value Date   WBC 9.3 06/13/2013   HGB 12.1 06/13/2013   HCT 36.9 06/13/2013   PLT 233.0 06/13/2013   GLUCOSE 76 06/03/2015   CHOL 126 06/03/2015   TRIG 47.0 06/03/2015   HDL 40.40 06/03/2015   LDLCALC 76 06/03/2015   ALT 8 06/03/2015   AST 13 06/03/2015   NA 138 06/03/2015   K 3.5 06/03/2015   CL 104 06/03/2015   CREATININE 0.77 06/03/2015   BUN 9 06/03/2015   CO2  25 06/03/2015   TSH 0.88 06/13/2013   INR 1.03 12/11/2010   HGBA1C 5.9 06/03/2015   MICROALBUR <0.7 06/03/2015    Mm Digital Screening Bilateral  Result Date: 08/02/2015 CLINICAL DATA:  Screening. EXAM: DIGITAL SCREENING BILATERAL MAMMOGRAM WITH CAD COMPARISON:  Previous exam(s). ACR Breast Density Category b: There are scattered areas of fibroglandular density. FINDINGS: There are no findings suspicious for malignancy. Images were processed with CAD. IMPRESSION: No mammographic evidence of malignancy. A result letter of this screening mammogram will be mailed directly to the patient. RECOMMENDATION: Screening mammogram in one year. (Code:SM-B-01Y) BI-RADS CATEGORY  1: Negative. Electronically Signed   By: Ted Mcalpineobrinka  Dimitrova M.D.   On: 08/05/2015 08:48     Assessment & Plan:  Plan  I have discontinued Ms. Flemmer's doxycycline. I am also having her start on LORazepam. Additionally, I am having her maintain her metoprolol succinate and hydrochlorothiazide.  Meds ordered this encounter  Medications  . LORazepam (ATIVAN) 0.5 MG tablet    Sig: Take 1 tablet (0.5 mg total) by mouth 2 (two) times daily as needed for anxiety.    Dispense:  30 tablet    Refill:  1    Problem List Items Addressed This Visit    None    Visit Diagnoses    Stress reaction    -  Primary   Relevant Medications   LORazepam (ATIVAN) 0.5 MG tablet    name and number given for Terri for counseling  rto 1 month or sooner prn    Follow-up: Return in about 4 weeks (around 05/27/2017), or if symptoms worsen or fail to improve, for stress f/u.  Donato SchultzYvonne R Lowne Chase, DO

## 2017-05-14 ENCOUNTER — Ambulatory Visit (INDEPENDENT_AMBULATORY_CARE_PROVIDER_SITE_OTHER): Payer: 59 | Admitting: Family Medicine

## 2017-05-14 ENCOUNTER — Encounter: Payer: Self-pay | Admitting: Family Medicine

## 2017-05-14 VITALS — BP 128/92 | HR 74 | Ht 66.0 in | Wt 299.6 lb

## 2017-05-14 DIAGNOSIS — I10 Essential (primary) hypertension: Secondary | ICD-10-CM | POA: Diagnosis not present

## 2017-05-14 DIAGNOSIS — F430 Acute stress reaction: Secondary | ICD-10-CM

## 2017-05-14 LAB — POC URINALSYSI DIPSTICK (AUTOMATED)
BILIRUBIN UA: NEGATIVE
GLUCOSE UA: NEGATIVE
KETONES UA: NEGATIVE
Leukocytes, UA: NEGATIVE
NITRITE UA: NEGATIVE
Protein, UA: NEGATIVE
RBC UA: NEGATIVE
Spec Grav, UA: >=1.03 — AB (ref 1.010–1.025)
Urobilinogen, UA: 1 E.U./dL
pH, UA: 6 (ref 5.0–8.0)

## 2017-05-14 LAB — CBC WITH DIFFERENTIAL/PLATELET
BASOS PCT: 0.6 % (ref 0.0–3.0)
Basophils Absolute: 0 10*3/uL (ref 0.0–0.1)
EOS PCT: 1.7 % (ref 0.0–5.0)
Eosinophils Absolute: 0.1 10*3/uL (ref 0.0–0.7)
HCT: 39.5 % (ref 36.0–46.0)
Hemoglobin: 12.7 g/dL (ref 12.0–15.0)
LYMPHS ABS: 2.6 10*3/uL (ref 0.7–4.0)
Lymphocytes Relative: 45.4 % (ref 12.0–46.0)
MCHC: 32.2 g/dL (ref 30.0–36.0)
MCV: 84.8 fl (ref 78.0–100.0)
MONOS PCT: 7.3 % (ref 3.0–12.0)
Monocytes Absolute: 0.4 10*3/uL (ref 0.1–1.0)
NEUTROS PCT: 45 % (ref 43.0–77.0)
Neutro Abs: 2.6 10*3/uL (ref 1.4–7.7)
Platelets: 231 10*3/uL (ref 150.0–400.0)
RBC: 4.66 Mil/uL (ref 3.87–5.11)
RDW: 15.9 % — ABNORMAL HIGH (ref 11.5–15.5)
WBC: 5.7 10*3/uL (ref 4.0–10.5)

## 2017-05-14 LAB — LIPID PANEL
Cholesterol: 134 mg/dL (ref 0–200)
HDL: 38.2 mg/dL — ABNORMAL LOW (ref >39.00)
LDL Cholesterol: 85 mg/dL (ref 0–99)
NONHDL: 96.27
Total CHOL/HDL Ratio: 4
Triglycerides: 55 mg/dL (ref 0.0–149.0)
VLDL: 11 mg/dL (ref 0.0–40.0)

## 2017-05-14 LAB — COMPREHENSIVE METABOLIC PANEL
ALK PHOS: 47 U/L (ref 39–117)
ALT: 7 U/L (ref 0–35)
AST: 10 U/L (ref 0–37)
Albumin: 3.6 g/dL (ref 3.5–5.2)
BILIRUBIN TOTAL: 0.6 mg/dL (ref 0.2–1.2)
BUN: 7 mg/dL (ref 6–23)
CO2: 27 meq/L (ref 19–32)
CREATININE: 0.8 mg/dL (ref 0.40–1.20)
Calcium: 9.2 mg/dL (ref 8.4–10.5)
Chloride: 106 mEq/L (ref 96–112)
GFR: 100.14 mL/min (ref >60.00)
GLUCOSE: 98 mg/dL (ref 70–99)
POTASSIUM: 4.1 meq/L (ref 3.5–5.1)
SODIUM: 138 meq/L (ref 135–145)
TOTAL PROTEIN: 6.7 g/dL (ref 6.0–8.3)

## 2017-05-14 LAB — MICROALBUMIN / CREATININE URINE RATIO
CREATININE, U: 426.4 mg/dL
Microalb Creat Ratio: 0.3 mg/g (ref 0.0–30.0)
Microalb, Ur: 1.1 mg/dL (ref 0.0–1.9)

## 2017-05-14 MED ORDER — LORAZEPAM 0.5 MG PO TABS: 0.5000 mg | ORAL_TABLET | Freq: Two times a day (BID) | ORAL | 1 refills | Status: DC | PRN

## 2017-05-14 MED ORDER — METOPROLOL SUCCINATE ER 100 MG PO TB24: 100.0000 mg | ORAL_TABLET | Freq: Every day | ORAL | 1 refills | Status: DC

## 2017-05-14 NOTE — Patient Instructions (Signed)

## 2017-05-14 NOTE — Progress Notes (Signed)
Patient ID: Jody [name], female    DOB: [birth year]  Age: 46 y.o. MRN: [id number]    Subjective:  Subjective  HPI Jody [name] presents for f/u stress / grief from her fathers sudden death.  She is doing "OK".  She still feels she does not need counseling-- she is doing ok with time.  She feels she needs some more time to heal .    Review of Systems  Constitutional: Negative for activity change, appetite change, fatigue and unexpected weight change.  Respiratory: Negative for cough and shortness of breath.   Cardiovascular: Negative for chest pain and palpitations.  Psychiatric/Behavioral: Positive for dysphoric mood. Negative for behavioral problems, sleep disturbance and suicidal ideas. The patient is not nervous/anxious.     History Past Medical History:  Diagnosis Date  . Anemia   . Anxiety   . Asthma   . Depression   . GERD (gastroesophageal reflux disease)   . History of anal fissures   . History of gallstones   . Migraines   . Obesity     She has a past surgical history that includes Cholecystectomy; Tubal ligation; and Flexible sigmoidoscopy (03/09/2012).   Her family history includes Arthritis in her father; Breast cancer in her mother; Cataracts in her maternal grandmother; Coronary artery disease in her maternal grandmother; Diabetes in her father; Glaucoma in her maternal grandmother; Heart attack (age of onset: 62) in her father; Heart disease in her maternal grandmother; Hepatitis (age of onset: 71) in her father; Hypertension in her father and mother; Kidney disease in her father; Obesity in her unknown relative.She reports that she has never smoked. She has never used smokeless tobacco. She reports that she does not drink alcohol or use drugs.  Current Outpatient Prescriptions on File Prior to Visit  Medication Sig Dispense Refill  . hydrochlorothiazide (HYDRODIURIL) 25 MG tablet TAKE 1 TABLET (25 MG TOTAL) BY MOUTH DAILY AS NEEDED 30 tablet 9   No current  facility-administered medications on file prior to visit.      Objective:  Objective  Physical Exam  Constitutional: She is oriented to person, place, and time. She appears well-developed and well-nourished.  HENT:  Head: Normocephalic and atraumatic.  Eyes: Conjunctivae and EOM are normal.  Neck: Normal range of motion. Neck supple. No JVD present. Carotid bruit is not present. No thyromegaly present.  Cardiovascular: Normal rate, regular rhythm and normal heart sounds.   No murmur heard. Pulmonary/Chest: Effort normal and breath sounds normal. No respiratory distress. She has no wheezes. She has no rales. She exhibits no tenderness.  Musculoskeletal: She exhibits no edema.  Neurological: She is alert and oriented to person, place, and time.  Psychiatric: Her speech is normal and behavior is normal. Judgment and thought content normal. Cognition and memory are normal. She exhibits a depressed mood.  Nursing note and vitals reviewed.  BP (!) 128/92 (BP Location: Left Wrist, Patient Position: Sitting, Cuff Size: Normal)   Pulse 74   Ht 5\' 6"  (1.676 m)   Wt 299 lb 9.6 oz (135.9 kg)   LMP 04/28/2017   SpO2 97%   BMI 48.36 kg/m  Wt Readings from Last 3 Encounters:  05/14/17 299 lb 9.6 oz (135.9 kg)  04/29/17 (!) 300 lb 8 oz (136.3 kg)  03/05/17 297 lb (134.7 kg)     Lab Results  Component Value Date   WBC 5.7 05/14/2017   HGB 12.7 05/14/2017   HCT 39.5 05/14/2017   PLT 231.0 05/14/2017   GLUCOSE  98 05/14/2017   CHOL 134 05/14/2017   TRIG 55.0 05/14/2017   HDL 38.20 (L) 05/14/2017   LDLCALC 85 05/14/2017   ALT 7 05/14/2017   AST 10 05/14/2017   NA 138 05/14/2017   K 4.1 05/14/2017   CL 106 05/14/2017   CREATININE 0.80 05/14/2017   BUN 7 05/14/2017   CO2 27 05/14/2017   TSH 0.88 06/13/2013   INR 1.03 12/11/2010   HGBA1C 5.9 06/03/2015   MICROALBUR 1.1 05/14/2017    Mm Digital Screening Bilateral  Result Date: 08/02/2015 CLINICAL DATA:  Screening. EXAM: DIGITAL  SCREENING BILATERAL MAMMOGRAM WITH CAD COMPARISON:  Previous exam(s). ACR Breast Density Category b: There are scattered areas of fibroglandular density. FINDINGS: There are no findings suspicious for malignancy. Images were processed with CAD. IMPRESSION: No mammographic evidence of malignancy. A result letter of this screening mammogram will be mailed directly to the patient. RECOMMENDATION: Screening mammogram in one year. (Code:SM-B-01Y) BI-RADS CATEGORY  1: Negative. Electronically Signed   By: Jody [name]  Jody [name] M.D.   On: 08/05/2015 08:48     Assessment & Plan:  Plan  I am having Jody [name] maintain her hydrochlorothiazide, metoprolol succinate, and LORazepam.  Meds ordered this encounter  Medications  . metoprolol succinate (TOPROL-XL) 100 MG 24 hr tablet    Sig: Take 1 tablet (100 mg total) by mouth daily. Take with or immediately following a meal.    Dispense:  90 tablet    Refill:  1    D/C PREVIOUS SCRIPTS FOR THIS MEDICATION  . LORazepam (ATIVAN) 0.5 MG tablet    Sig: Take 1 tablet (0.5 mg total) by mouth 2 (two) times daily as needed for anxiety.    Dispense:  30 tablet    Refill:  1    Problem List Items Addressed This Visit      Unprioritized   Essential hypertension - Primary    Well controlled, no changes to meds. Encouraged heart healthy diet such as the DASH diet and exercise as tolerated.       Relevant Medications   metoprolol succinate (TOPROL-XL) 100 MG 24 hr tablet   Other Relevant Orders   CBC with Differential/Platelet (Completed)   Lipid panel (Completed)   Comprehensive metabolic panel (Completed)   POCT Urinalysis Dipstick (Automated) (Completed)   Microalbumin / creatinine urine ratio (Completed)   Stress reaction    Improving with time Pt needs more time to get her dads things in order       Relevant Medications   LORazepam (ATIVAN) 0.5 MG tablet      Follow-up: Return in about 4 weeks (around 06/11/2017).  Donato SchultzYvonne R Lowne Chase, DO

## 2017-05-16 DIAGNOSIS — F430 Acute stress reaction: Secondary | ICD-10-CM | POA: Insufficient documentation

## 2017-05-16 NOTE — Assessment & Plan Note (Signed)
Improving with time Pt needs more time to get her dads things in order

## 2017-05-16 NOTE — Assessment & Plan Note (Signed)
Well controlled, no changes to meds. Encouraged heart healthy diet such as the DASH diet and exercise as tolerated.  °

## 2017-07-22 ENCOUNTER — Telehealth: Payer: Self-pay

## 2017-07-22 MED ORDER — METOPROLOL TARTRATE 50 MG PO TABS: 50.0000 mg | ORAL_TABLET | Freq: Two times a day (BID) | ORAL | 0 refills | Status: DC

## 2017-07-22 NOTE — Telephone Encounter (Signed)
Received fax from [facility]- Pt requesting cheaper alternative for Toprol-XL 100mg  1 tablet daily. Cost out of pocket is $13 dollars vs $3 dollars for Metoprolol 50mg . Okay per Dr. Abner GreenspanBlyth (PCP absent)  to send Metoprolol 50mg  1 tablet by mouth bid. Rx sent to [facility].

## 2017-08-27 ENCOUNTER — Encounter: Payer: 59 | Admitting: Family Medicine

## 2017-09-03 ENCOUNTER — Telehealth: Payer: Self-pay | Admitting: *Deleted

## 2017-09-03 NOTE — Telephone Encounter (Signed)
Left message on machine to call back  

## 2017-09-03 NOTE — Telephone Encounter (Signed)
Copied from CRM [phone]. Topic: Inquiry >> Aug 30, 2017  7:03 PM Alexander BergeronBarksdale, Harvey B wrote: Reason for CRM: pt called to ask questions about the June 21st appt, she missed it and wants to know what the appt was for, no appt notes available to go off of to notify pt, contact pt to advise

## 2017-10-28 ENCOUNTER — Other Ambulatory Visit: Payer: Self-pay | Admitting: Family Medicine

## 2018-01-06 ENCOUNTER — Encounter: Payer: Self-pay | Admitting: Family Medicine

## 2018-01-06 ENCOUNTER — Ambulatory Visit: Payer: 59 | Admitting: Family Medicine

## 2018-01-06 VITALS — BP 130/78 | HR 104 | Temp 98.9°F | Resp 18 | Ht 66.0 in | Wt 290.0 lb

## 2018-01-06 DIAGNOSIS — J40 Bronchitis, not specified as acute or chronic: Secondary | ICD-10-CM | POA: Diagnosis not present

## 2018-01-06 DIAGNOSIS — F430 Acute stress reaction: Secondary | ICD-10-CM

## 2018-01-06 DIAGNOSIS — I10 Essential (primary) hypertension: Secondary | ICD-10-CM

## 2018-01-06 LAB — POC INFLUENZA A&B (BINAX/QUICKVUE)
INFLUENZA B, POC: NEGATIVE
Influenza A, POC: NEGATIVE

## 2018-01-06 MED ORDER — METOPROLOL TARTRATE 50 MG PO TABS: 50.0000 mg | ORAL_TABLET | Freq: Two times a day (BID) | ORAL | 1 refills | Status: DC

## 2018-01-06 MED ORDER — HYDROCODONE-HOMATROPINE 5-1.5 MG/5ML PO SYRP: 5.0000 mL | ORAL_SOLUTION | Freq: Three times a day (TID) | ORAL | 0 refills | Status: DC | PRN

## 2018-01-06 MED ORDER — AZITHROMYCIN 250 MG PO TABS: ORAL_TABLET | ORAL | 0 refills | Status: DC

## 2018-01-06 MED ORDER — ALBUTEROL SULFATE HFA 108 (90 BASE) MCG/ACT IN AERS
2.0000 | INHALATION_SPRAY | Freq: Four times a day (QID) | RESPIRATORY_TRACT | 2 refills | Status: DC | PRN
Start: 2018-01-06 — End: 2018-07-25

## 2018-01-06 MED ORDER — PREDNISONE 10 MG PO TABS: ORAL_TABLET | ORAL | 0 refills | Status: DC

## 2018-01-06 NOTE — Progress Notes (Signed)
Patient ID: Lenoria Farrieriphany S Lawal, female   DOB: 01/12/1971, 47 y.o.   MRN: [id number]    Subjective:  I acted as a Neurosurgeonscribe for Dr. Zola ButtonLowne-Chase.  Apolonio SchneidersSheketia, CMA   Patient ID: Lenoria Farrieriphany S Larke, female    DOB: 08/29/1971, 47 y.o.   MRN: [id number]  Chief Complaint  Patient presents with  . Cough  . Hypertension    Cough  This is a new problem. Episode onset: Sunday or Monday. The cough is productive of sputum. Associated symptoms include headaches, myalgias (back and neck), nasal congestion, postnasal drip, rhinorrhea, shortness of breath and wheezing. Pertinent negatives include no chills, ear congestion, ear pain or sore throat. Treatments tried: Tylenol Sinus, Mucinex, Zyrtec, Robitussin.    Patient is in today for cough and blood pressure follow up.  Patient Care Team: Zola ButtonLowne Chase, Grayling CongressYvonne R, DO as PCP - General   Past Medical History:  Diagnosis Date  . Anemia   . Anxiety   . Asthma   . Depression   . GERD (gastroesophageal reflux disease)   . History of anal fissures   . History of gallstones   . Migraines   . Obesity     Past Surgical History:  Procedure Laterality Date  . CHOLECYSTECTOMY    . FLEXIBLE SIGMOIDOSCOPY  03/09/2012   Procedure: FLEXIBLE SIGMOIDOSCOPY;  Surgeon: Louis Meckelobert D Kaplan, MD;  Location: [facility];  Service: Endoscopy;  Laterality: N/A;  . TUBAL LIGATION     Essure    Family History  Problem Relation Age of Onset  . Breast cancer Mother   . Hypertension Mother   . Hepatitis Father 51       Hep C  . Diabetes Father   . Arthritis Father   . Hypertension Father   . Kidney disease Father   . Heart attack Father [id number]  . Coronary artery disease Maternal Grandmother   . Cataracts Maternal Grandmother   . Glaucoma Maternal Grandmother   . Heart disease Maternal Grandmother   . Obesity Unknown     Social History   Socioeconomic History  . Marital status: Single    Spouse name: Not on file  . Number of children: 3  . Years of education: Not on  file  . Highest education level: Not on file  Occupational History  . Occupation: disability    Employer: [employer]  Social Needs  . Financial resource strain: Not on file  . Food insecurity:    Worry: Not on file    Inability: Not on file  . Transportation needs:    Medical: Not on file    Non-medical: Not on file  Tobacco Use  . Smoking status: Never Smoker  . Smokeless tobacco: Never Used  Substance and Sexual Activity  . Alcohol use: No  . Drug use: No  . Sexual activity: Yes    Partners: Male  Lifestyle  . Physical activity:    Days per week: Not on file    Minutes per session: Not on file  . Stress: Not on file  Relationships  . Social connections:    Talks on phone: Not on file    Gets together: Not on file    Attends religious service: Not on file    Active member of club or organization: Not on file    Attends meetings of clubs or organizations: Not on file    Relationship status: Not on file  . Intimate partner violence:    Fear of current or ex partner:  Not on file    Emotionally abused: Not on file    Physically abused: Not on file    Forced sexual activity: Not on file  Other Topics Concern  . Not on file  Social History Narrative   Exercise-- trying to do some steps    Outpatient Medications Prior to Visit  Medication Sig Dispense Refill  . hydrochlorothiazide (HYDRODIURIL) 25 MG tablet TAKE 1 TABLET (25 MG TOTAL) BY MOUTH DAILY AS NEEDED 30 tablet 9  . LORazepam (ATIVAN) 0.5 MG tablet Take 1 tablet (0.5 mg total) by mouth 2 (two) times daily as needed for anxiety. 30 tablet 1  . metoprolol tartrate (LOPRESSOR) 50 MG tablet TAKE 1 TABLET (50 MG TOTAL) 2 (TWO) TIMES DAILY BY MOUTH. 60 tablet 0   No facility-administered medications prior to visit.     No Known Allergies  Review of Systems  Constitutional: Negative for chills.  HENT: Positive for postnasal drip and rhinorrhea. Negative for ear pain and sore throat.   Respiratory:  Positive for cough, shortness of breath and wheezing.   Musculoskeletal: Positive for myalgias (back and neck).  Neurological: Positive for headaches.       Objective:    Physical Exam  BP 130/78 (BP Location: Left Arm, Cuff Size: Large)   Pulse (!) 104   Temp 98.9 F (37.2 C) (Oral)   Resp 18   Ht 5\' 6"  (1.676 m)   Wt 290 lb (131.5 kg)   LMP 12/18/2017   SpO2 97%   BMI 46.81 kg/m  Wt Readings from Last 3 Encounters:  01/06/18 290 lb (131.5 kg)  05/14/17 299 lb 9.6 oz (135.9 kg)  04/29/17 (!) 300 lb 8 oz (136.3 kg)   BP Readings from Last 3 Encounters:  01/06/18 130/78  05/14/17 (!) 128/92  04/29/17 (!) 144/80     Immunization History  Administered Date(s) Administered  . Td 04/04/2007    Health Maintenance  Topic Date Due  . HIV Screening  03/22/1988  . PAP SMEAR  06/13/2016  . MAMMOGRAM  08/01/2016  . TETANUS/TDAP  04/03/2017  . INFLUENZA VACCINE  04/14/2018    Lab Results  Component Value Date   WBC 5.7 05/14/2017   HGB 12.7 05/14/2017   HCT 39.5 05/14/2017   PLT 231.0 05/14/2017   GLUCOSE 98 05/14/2017   CHOL 134 05/14/2017   TRIG 55.0 05/14/2017   HDL 38.20 (L) 05/14/2017   LDLCALC 85 05/14/2017   ALT 7 05/14/2017   AST 10 05/14/2017   NA 138 05/14/2017   K 4.1 05/14/2017   CL 106 05/14/2017   CREATININE 0.80 05/14/2017   BUN 7 05/14/2017   CO2 27 05/14/2017   TSH 0.88 06/13/2013   INR 1.03 12/11/2010   HGBA1C 5.9 06/03/2015   MICROALBUR 1.1 05/14/2017    Lab Results  Component Value Date   TSH 0.88 06/13/2013   Lab Results  Component Value Date   WBC 5.7 05/14/2017   HGB 12.7 05/14/2017   HCT 39.5 05/14/2017   MCV 84.8 05/14/2017   PLT 231.0 05/14/2017   Lab Results  Component Value Date   NA 138 05/14/2017   K 4.1 05/14/2017   CO2 27 05/14/2017   GLUCOSE 98 05/14/2017   BUN 7 05/14/2017   CREATININE 0.80 05/14/2017   BILITOT 0.6 05/14/2017   ALKPHOS 47 05/14/2017   AST 10 05/14/2017   ALT 7 05/14/2017   PROT 6.7  05/14/2017   ALBUMIN 3.6 05/14/2017   CALCIUM 9.2 05/14/2017  GFR 100.14 05/14/2017   Lab Results  Component Value Date   CHOL 134 05/14/2017   Lab Results  Component Value Date   HDL 38.20 (L) 05/14/2017   Lab Results  Component Value Date   LDLCALC 85 05/14/2017   Lab Results  Component Value Date   TRIG 55.0 05/14/2017   Lab Results  Component Value Date   CHOLHDL 4 05/14/2017   Lab Results  Component Value Date   HGBA1C 5.9 06/03/2015         Assessment & Plan:   Problem List Items Addressed This Visit      Unprioritized   Essential hypertension - Primary   Relevant Medications   metoprolol tartrate (LOPRESSOR) 50 MG tablet   Stress reaction    Other Visit Diagnoses    Bronchitis       Relevant Medications   predniSONE (DELTASONE) 10 MG tablet   HYDROcodone-homatropine (HYCODAN) 5-1.5 MG/5ML syrup   albuterol (PROVENTIL HFA;VENTOLIN HFA) 108 (90 Base) MCG/ACT inhaler   azithromycin (ZITHROMAX Z-PAK) 250 MG tablet   Other Relevant Orders   POC Influenza A&B (Binax test) (Completed)      I have changed Ebelin S. Yuille's metoprolol tartrate. I am also having her start on predniSONE, HYDROcodone-homatropine, albuterol, and azithromycin. Additionally, I am having her maintain her hydrochlorothiazide and LORazepam.  Meds ordered this encounter  Medications  . metoprolol tartrate (LOPRESSOR) 50 MG tablet    Sig: Take 1 tablet (50 mg total) by mouth 2 (two) times daily.    Dispense:  180 tablet    Refill:  1  . predniSONE (DELTASONE) 10 MG tablet    Sig: TAKE 3 TABLETS PO QD FOR 3 DAYS THEN TAKE 2 TABLETS PO QD FOR 3 DAYS THEN TAKE 1 TABLET PO QD FOR 3 DAYS THEN TAKE 1/2 TAB PO QD FOR 3 DAYS    Dispense:  20 tablet    Refill:  0  . HYDROcodone-homatropine (HYCODAN) 5-1.5 MG/5ML syrup    Sig: Take 5 mLs by mouth every 8 (eight) hours as needed for cough.    Dispense:  120 mL    Refill:  0  . albuterol (PROVENTIL HFA;VENTOLIN HFA) 108 (90 Base)  MCG/ACT inhaler    Sig: Inhale 2 puffs into the lungs every 6 (six) hours as needed for wheezing or shortness of breath.    Dispense:  1 Inhaler    Refill:  2  . azithromycin (ZITHROMAX Z-PAK) 250 MG tablet    Sig: As directed    Dispense:  6 each    Refill:  0    CMA served as scribe during this visit. History, Physical and Plan performed by medical provider. Documentation and orders reviewed and attested to.  Donato Schultz, DO

## 2018-01-06 NOTE — Patient Instructions (Signed)

## 2018-03-25 ENCOUNTER — Encounter

## 2018-03-25 ENCOUNTER — Encounter: Payer: 59 | Admitting: Family Medicine

## 2018-07-08 ENCOUNTER — Encounter: Payer: 59 | Admitting: Family Medicine

## 2018-07-25 ENCOUNTER — Encounter: Payer: Self-pay | Admitting: Family Medicine

## 2018-07-25 ENCOUNTER — Ambulatory Visit (INDEPENDENT_AMBULATORY_CARE_PROVIDER_SITE_OTHER): Payer: BLUE CROSS/BLUE SHIELD | Admitting: Family Medicine

## 2018-07-25 ENCOUNTER — Other Ambulatory Visit (HOSPITAL_COMMUNITY)
Admission: RE | Admit: 2018-07-25 | Discharge: 2018-07-25 | Disposition: A | Discharge status: Home / Self Care | Payer: BLUE CROSS/BLUE SHIELD | Source: Ambulatory Visit | Attending: Family Medicine | Admitting: Family Medicine

## 2018-07-25 VITALS — BP 127/68 | HR 72 | Temp 98.3°F | Ht 66.0 in | Wt 301.8 lb

## 2018-07-25 DIAGNOSIS — Z114 Encounter for screening for human immunodeficiency virus [HIV]: Secondary | ICD-10-CM | POA: Diagnosis not present

## 2018-07-25 DIAGNOSIS — F419 Anxiety disorder, unspecified: Secondary | ICD-10-CM | POA: Diagnosis not present

## 2018-07-25 DIAGNOSIS — F329 Major depressive disorder, single episode, unspecified: Secondary | ICD-10-CM

## 2018-07-25 DIAGNOSIS — Z124 Encounter for screening for malignant neoplasm of cervix: Secondary | ICD-10-CM | POA: Insufficient documentation

## 2018-07-25 DIAGNOSIS — Z1211 Encounter for screening for malignant neoplasm of colon: Secondary | ICD-10-CM

## 2018-07-25 DIAGNOSIS — I10 Essential (primary) hypertension: Secondary | ICD-10-CM | POA: Diagnosis not present

## 2018-07-25 DIAGNOSIS — Z1239 Encounter for other screening for malignant neoplasm of breast: Secondary | ICD-10-CM

## 2018-07-25 DIAGNOSIS — Z23 Encounter for immunization: Secondary | ICD-10-CM | POA: Diagnosis not present

## 2018-07-25 DIAGNOSIS — F32A Depression, unspecified: Secondary | ICD-10-CM

## 2018-07-25 DIAGNOSIS — J40 Bronchitis, not specified as acute or chronic: Secondary | ICD-10-CM

## 2018-07-25 DIAGNOSIS — Z79899 Other long term (current) drug therapy: Secondary | ICD-10-CM | POA: Diagnosis not present

## 2018-07-25 DIAGNOSIS — F430 Acute stress reaction: Secondary | ICD-10-CM

## 2018-07-25 DIAGNOSIS — Z0000 Encounter for general adult medical examination without abnormal findings: Secondary | ICD-10-CM

## 2018-07-25 LAB — CBC WITH DIFFERENTIAL/PLATELET
Basophils Absolute: 0.1 10*3/uL (ref 0.0–0.1)
Basophils Relative: 1.2 % (ref 0.0–3.0)
EOS PCT: 2.1 % (ref 0.0–5.0)
Eosinophils Absolute: 0.1 10*3/uL (ref 0.0–0.7)
HCT: 36 % (ref 36.0–46.0)
HEMOGLOBIN: 11.8 g/dL — AB (ref 12.0–15.0)
LYMPHS PCT: 46.3 % — AB (ref 12.0–46.0)
Lymphs Abs: 2.8 10*3/uL (ref 0.7–4.0)
MCHC: 32.8 g/dL (ref 30.0–36.0)
MCV: 80.7 fl (ref 78.0–100.0)
Monocytes Absolute: 0.5 10*3/uL (ref 0.1–1.0)
Monocytes Relative: 8 % (ref 3.0–12.0)
Neutro Abs: 2.5 10*3/uL (ref 1.4–7.7)
Neutrophils Relative %: 42.4 % — ABNORMAL LOW (ref 43.0–77.0)
Platelets: 246 10*3/uL (ref 150.0–400.0)
RBC: 4.47 Mil/uL (ref 3.87–5.11)
RDW: 16.3 % — ABNORMAL HIGH (ref 11.5–15.5)
WBC: 6 10*3/uL (ref 4.0–10.5)

## 2018-07-25 LAB — COMPREHENSIVE METABOLIC PANEL
ALBUMIN: 3.5 g/dL (ref 3.5–5.2)
ALK PHOS: 50 U/L (ref 39–117)
ALT: 7 U/L (ref 0–35)
AST: 11 U/L (ref 0–37)
BILIRUBIN TOTAL: 0.6 mg/dL (ref 0.2–1.2)
BUN: 9 mg/dL (ref 6–23)
CO2: 28 mEq/L (ref 19–32)
Calcium: 8.6 mg/dL (ref 8.4–10.5)
Chloride: 105 mEq/L (ref 96–112)
Creatinine, Ser: 0.78 mg/dL (ref 0.40–1.20)
GFR: 102.55 mL/min (ref >60.00)
Glucose, Bld: 96 mg/dL (ref 70–99)
POTASSIUM: 4 meq/L (ref 3.5–5.1)
Sodium: 138 mEq/L (ref 135–145)
TOTAL PROTEIN: 6.5 g/dL (ref 6.0–8.3)

## 2018-07-25 LAB — TSH: TSH: 0.8 u[IU]/mL (ref 0.35–4.50)

## 2018-07-25 LAB — POC HEMOCCULT BLD/STL (OFFICE/1-CARD/DIAGNOSTIC)
Card #1 Date: 11112019
Fecal Occult Blood, POC: NEGATIVE

## 2018-07-25 LAB — LIPID PANEL
CHOLESTEROL: 134 mg/dL (ref 0–200)
HDL: 42.6 mg/dL (ref >39.00)
LDL Cholesterol: 80 mg/dL (ref 0–99)
NonHDL: 91.32
Total CHOL/HDL Ratio: 3
Triglycerides: 55 mg/dL (ref 0.0–149.0)
VLDL: 11 mg/dL (ref 0.0–40.0)

## 2018-07-25 MED ORDER — LORAZEPAM 0.5 MG PO TABS: 0.5000 mg | ORAL_TABLET | Freq: Two times a day (BID) | ORAL | 1 refills | Status: DC | PRN

## 2018-07-25 MED ORDER — ALBUTEROL SULFATE HFA 108 (90 BASE) MCG/ACT IN AERS: 2.0000 | INHALATION_SPRAY | Freq: Four times a day (QID) | RESPIRATORY_TRACT | 2 refills | Status: DC | PRN

## 2018-07-25 MED ORDER — METOPROLOL TARTRATE 50 MG PO TABS: 50.0000 mg | ORAL_TABLET | Freq: Two times a day (BID) | ORAL | 1 refills | Status: AC

## 2018-07-25 MED ORDER — HYDROCHLOROTHIAZIDE 25 MG PO TABS: ORAL_TABLET | ORAL | 9 refills | Status: DC

## 2018-07-25 NOTE — Progress Notes (Signed)
Subjective:     Jody [name] is a 47 y.o. female and is here for a comprehensive physical exam. The patient reports no problems.  Social History   Socioeconomic History  . Marital status: Single    Spouse name: Not on file  . Number of children: 3  . Years of education: Not on file  . Highest education level: Not on file  Occupational History  . Occupation: disability    Employer: [employer]  Social Needs  . Financial resource strain: Not on file  . Food insecurity:    Worry: Not on file    Inability: Not on file  . Transportation needs:    Medical: Not on file    Non-medical: Not on file  Tobacco Use  . Smoking status: Never Smoker  . Smokeless tobacco: Never Used  Substance and Sexual Activity  . Alcohol use: No  . Drug use: No  . Sexual activity: Yes    Partners: Male  Lifestyle  . Physical activity:    Days per week: Not on file    Minutes per session: Not on file  . Stress: Not on file  Relationships  . Social connections:    Talks on phone: Not on file    Gets together: Not on file    Attends religious service: Not on file    Active member of club or organization: Not on file    Attends meetings of clubs or organizations: Not on file    Relationship status: Not on file  . Intimate partner violence:    Fear of current or ex partner: Not on file    Emotionally abused: Not on file    Physically abused: Not on file    Forced sexual activity: Not on file  Other Topics Concern  . Not on file  Social History Narrative   Exercise-- trying to do some steps   Health Maintenance  Topic Date Due  . HIV Screening  03/22/1988  . PAP SMEAR  06/13/2016  . MAMMOGRAM  08/01/2016  . TETANUS/TDAP  04/03/2017  . INFLUENZA VACCINE  04/15/2019 (Originally 04/14/2018)    The following portions of the patient's history were reviewed and updated as appropriate: She  has a past medical history of Anemia, Anxiety, Asthma, Depression, GERD (gastroesophageal  reflux disease), History of anal fissures, History of gallstones, Migraines, and Obesity. She does not have any pertinent problems on file. She  has a past surgical history that includes Cholecystectomy; Tubal ligation; and Flexible sigmoidoscopy (03/09/2012). Her family history includes Arthritis in her father; Breast cancer in her mother; Cataracts in her maternal grandmother; Coronary artery disease in her maternal grandmother; Diabetes in her father; Glaucoma in her maternal grandmother; Heart attack (age of onset: 51) in her father; Heart disease in her maternal grandmother; Hepatitis (age of onset: 72) in her father; Hypertension in her father and mother; Kidney disease in her father; Obesity in her unknown relative. She  reports that she has never smoked. She has never used smokeless tobacco. She reports that she does not drink alcohol or use drugs. She has a current medication list which includes the following prescription(s): albuterol, hydrochlorothiazide, lorazepam, and metoprolol tartrate. No current outpatient medications on file prior to visit.   No current facility-administered medications on file prior to visit.    She has No Known Allergies..  Review of Systems Review of Systems  Constitutional: Negative for activity change, appetite change and fatigue.  HENT: Negative for hearing loss, congestion, tinnitus  and ear discharge.  dentist q80m Eyes: Negative for visual disturbance (see optho q1y -- vision corrected to 20/20 with glasses).  Respiratory: Negative for cough, chest tightness and shortness of breath.   Cardiovascular: Negative for chest pain, palpitations and leg swelling.  Gastrointestinal: Negative for abdominal pain, diarrhea, constipation and abdominal distention.  Genitourinary: Negative for urgency, frequency, decreased urine volume and difficulty urinating.  Musculoskeletal: Negative for back pain, arthralgias and gait problem.  Skin: Negative for color change,  pallor and rash.  Neurological: Negative for dizziness, light-headedness, numbness and headaches.  Hematological: Negative for adenopathy. Does not bruise/bleed easily.  Psychiatric/Behavioral: Negative for suicidal ideas, confusion, sleep disturbance, self-injury, dysphoric mood, decreased concentration and agitation.       Objective:    LMP 06/27/2018  General appearance: alert, cooperative, appears stated age and no distress Head: Normocephalic, without obvious abnormality, atraumatic Eyes: negative findings: lids and lashes normal, conjunctivae and sclerae normal and pupils equal, round, reactive to light and accomodation Ears: normal TM's and external ear canals both ears Nose: Nares normal. Septum midline. Mucosa normal. No drainage or sinus tenderness. Throat: lips, mucosa, and tongue normal; teeth and gums normal Neck: no adenopathy, no carotid bruit, no JVD, supple, symmetrical, trachea midline and thyroid not enlarged, symmetric, no tenderness/mass/nodules Back: symmetric, no curvature. ROM normal. No CVA tenderness. Lungs: clear to auscultation bilaterally Breasts: normal appearance, no masses or tenderness Heart: regular rate and rhythm, S1, S2 normal, no murmur, click, rub or gallop Abdomen: soft, non-tender; bowel sounds normal; no masses,  no organomegaly Pelvic: cervix normal in appearance, external genitalia normal, no adnexal masses or tenderness, no cervical motion tenderness, rectovaginal septum normal, uterus normal size, shape, and consistency, vagina normal without discharge and pap done,  rectal --heme neg brown stool Extremities: extremities normal, atraumatic, no cyanosis or edema Pulses: 2+ and symmetric Skin: Skin color, texture, turgor normal. No rashes or lesions Lymph nodes: Cervical, supraclavicular, and axillary nodes normal. Neurologic: Alert and oriented X 3, normal strength and tone. Normal symmetric reflexes. Normal coordination and gait     Assessment:    Healthy female exam.      Plan:    ghm utd Check labs  See After Visit Summary for Counseling Recommendations    1. Essential hypertension Well controlled, no changes to meds. Encouraged heart healthy diet such as the DASH diet and exercise as tolerated.  - metoprolol tartrate (LOPRESSOR) 50 MG tablet; Take 1 tablet (50 mg total) by mouth 2 (two) times daily.  Dispense: 180 tablet; Refill: 1 - hydrochlorothiazide (HYDRODIURIL) 25 MG tablet; TAKE 1 TABLET (25 MG TOTAL) BY MOUTH DAILY AS NEEDED  Dispense: 30 tablet; Refill: 9  2. Preventative health care See above  - CBC with Differential/Platelet - Comprehensive metabolic panel - Lipid panel - TSH  3. Cervical cancer screening  - Cytology - PAP( [facility])  4. Screening for HIV (human immunodeficiency virus)   - HIV Antibody (routine testing w rflx)  5. Breast cancer screening   - MM DIGITAL SCREENING BILATERAL; Future  6. Anxiety and depression Stable   con't prn med - Pain Mgmt, Profile 8 w/Conf, U  7. High risk medication use   - Pain Mgmt, Profile 8 w/Conf, U

## 2018-07-25 NOTE — Addendum Note (Signed)
Addended by: Thelma Barge D on: 07/25/2018 01:28 PM   Modules accepted: Orders

## 2018-07-25 NOTE — Addendum Note (Signed)
Addended by: Thelma Barge D on: 07/25/2018 04:35 PM   Modules accepted: Orders

## 2018-07-25 NOTE — Patient Instructions (Signed)
Preventive Care 40-64 Years, Female Preventive care refers to lifestyle choices and visits with your health care provider that can promote health and wellness. What does preventive care include?  A yearly physical exam. This is also called an annual well check.  Dental exams once or twice a year.  Routine eye exams. Ask your health care provider how often you should have your eyes checked.  Personal lifestyle choices, including: ? Daily care of your teeth and gums. ? Regular physical activity. ? Eating a healthy diet. ? Avoiding tobacco and drug use. ? Limiting alcohol use. ? Practicing safe sex. ? Taking low-dose aspirin daily starting at age 58. ? Taking vitamin and mineral supplements as recommended by your health care provider. What happens during an annual well check? The services and screenings done by your health care provider during your annual well check will depend on your age, overall health, lifestyle risk factors, and family history of disease. Counseling Your health care provider may ask you questions about your:  Alcohol use.  Tobacco use.  Drug use.  Emotional well-being.  Home and relationship well-being.  Sexual activity.  Eating habits.  Work and work Statistician.  Method of birth control.  Menstrual cycle.  Pregnancy history.  Screening You may have the following tests or measurements:  Height, weight, and BMI.  Blood pressure.  Lipid and cholesterol levels. These may be checked every 5 years, or more frequently if you are over 81 years old.  Skin check.  Lung cancer screening. You may have this screening every year starting at age 78 if you have a 30-pack-year history of smoking and currently smoke or have quit within the past 15 years.  Fecal occult blood test (FOBT) of the stool. You may have this test every year starting at age 65.  Flexible sigmoidoscopy or colonoscopy. You may have a sigmoidoscopy every 5 years or a colonoscopy  every 10 years starting at age 30.  Hepatitis C blood test.  Hepatitis B blood test.  Sexually transmitted disease (STD) testing.  Diabetes screening. This is done by checking your blood sugar (glucose) after you have not eaten for a while (fasting). You may have this done every 1-3 years.  Mammogram. This may be done every 1-2 years. Talk to your health care provider about when you should start having regular mammograms. This may depend on whether you have a family history of breast cancer.  BRCA-related cancer screening. This may be done if you have a family history of breast, ovarian, tubal, or peritoneal cancers.  Pelvic exam and Pap test. This may be done every 3 years starting at age 80. Starting at age 36, this may be done every 5 years if you have a Pap test in combination with an HPV test.  Bone density scan. This is done to screen for osteoporosis. You may have this scan if you are at high risk for osteoporosis.  Discuss your test results, treatment options, and if necessary, the need for more tests with your health care provider. Vaccines Your health care provider may recommend certain vaccines, such as:  Influenza vaccine. This is recommended every year.  Tetanus, diphtheria, and acellular pertussis (Tdap, Td) vaccine. You may need a Td booster every 10 years.  Varicella vaccine. You may need this if you have not been vaccinated.  Zoster vaccine. You may need this after age 5.  Measles, mumps, and rubella (MMR) vaccine. You may need at least one dose of MMR if you were born in  1957 or later. You may also need a second dose.  Pneumococcal 13-valent conjugate (PCV13) vaccine. You may need this if you have certain conditions and were not previously vaccinated.  Pneumococcal polysaccharide (PPSV23) vaccine. You may need one or two doses if you smoke cigarettes or if you have certain conditions.  Meningococcal vaccine. You may need this if you have certain  conditions.  Hepatitis A vaccine. You may need this if you have certain conditions or if you travel or work in places where you may be exposed to hepatitis A.  Hepatitis B vaccine. You may need this if you have certain conditions or if you travel or work in places where you may be exposed to hepatitis B.  Haemophilus influenzae type b (Hib) vaccine. You may need this if you have certain conditions.  Talk to your health care provider about which screenings and vaccines you need and how often you need them. This information is not intended to replace advice given to you by your health care provider. Make sure you discuss any questions you have with your health care provider. Document Released: 09/27/2015 Document Revised: 05/20/2016 Document Reviewed: 07/02/2015 Elsevier Interactive Patient Education  2018 Elsevier Inc.  

## 2018-07-26 ENCOUNTER — Other Ambulatory Visit: Payer: Self-pay | Admitting: Family Medicine

## 2018-07-26 DIAGNOSIS — N760 Acute vaginitis: Secondary | ICD-10-CM

## 2018-07-26 LAB — PAIN MGMT, PROFILE 8 W/CONF, U
6 ACETYLMORPHINE: NEGATIVE ng/mL (ref <10)
AMPHETAMINES: NEGATIVE ng/mL (ref <500)
Alcohol Metabolites: NEGATIVE ng/mL (ref <500)
Benzodiazepines: NEGATIVE ng/mL (ref <100)
Buprenorphine, Urine: NEGATIVE ng/mL (ref <5)
Cocaine Metabolite: NEGATIVE ng/mL (ref <150)
Creatinine: 71.4 mg/dL
MARIJUANA METABOLITE: NEGATIVE ng/mL (ref <20)
MDMA: NEGATIVE ng/mL (ref <500)
OPIATES: NEGATIVE ng/mL (ref <100)
Oxidant: NEGATIVE ug/mL (ref <200)
Oxycodone: NEGATIVE ng/mL (ref <100)
pH: 6.52 (ref 4.5–9.0)

## 2018-07-26 LAB — CYTOLOGY - PAP
Adequacy: ABSENT
Bacterial vaginitis: NEGATIVE
CHLAMYDIA, DNA PROBE: NEGATIVE
Candida vaginitis: POSITIVE — AB
Diagnosis: NEGATIVE
HPV (WINDOPATH): NOT DETECTED
NEISSERIA GONORRHEA: NEGATIVE
TRICH (WINDOWPATH): NEGATIVE

## 2018-07-26 LAB — HIV ANTIBODY (ROUTINE TESTING W REFLEX): HIV 1&2 Ab, 4th Generation: NONREACTIVE

## 2018-07-26 MED ORDER — FLUCONAZOLE 150 MG PO TABS: ORAL_TABLET | ORAL | 0 refills | Status: DC

## 2018-10-19 ENCOUNTER — Other Ambulatory Visit: Payer: Self-pay | Admitting: Family Medicine

## 2018-10-19 DIAGNOSIS — J40 Bronchitis, not specified as acute or chronic: Secondary | ICD-10-CM

## 2018-12-05 ENCOUNTER — Other Ambulatory Visit: Payer: Self-pay | Admitting: Family Medicine

## 2018-12-05 DIAGNOSIS — N760 Acute vaginitis: Secondary | ICD-10-CM

## 2018-12-06 ENCOUNTER — Other Ambulatory Visit: Payer: Self-pay | Admitting: Family Medicine

## 2018-12-06 DIAGNOSIS — N760 Acute vaginitis: Secondary | ICD-10-CM

## 2018-12-07 ENCOUNTER — Other Ambulatory Visit: Payer: Self-pay | Admitting: Family Medicine

## 2018-12-07 DIAGNOSIS — N760 Acute vaginitis: Secondary | ICD-10-CM

## 2018-12-07 MED ORDER — FLUCONAZOLE 150 MG PO TABS: ORAL_TABLET | ORAL | 0 refills | Status: DC

## 2019-01-09 ENCOUNTER — Ambulatory Visit (INDEPENDENT_AMBULATORY_CARE_PROVIDER_SITE_OTHER): Payer: BLUE CROSS/BLUE SHIELD | Admitting: Family Medicine

## 2019-01-09 ENCOUNTER — Other Ambulatory Visit: Payer: Self-pay

## 2019-01-09 ENCOUNTER — Encounter: Payer: Self-pay | Admitting: Family Medicine

## 2019-01-09 DIAGNOSIS — K644 Residual hemorrhoidal skin tags: Secondary | ICD-10-CM | POA: Diagnosis not present

## 2019-01-09 MED ORDER — HYDROCORTISONE ACETATE 25 MG RE SUPP: 25.0000 mg | Freq: Two times a day (BID) | RECTAL | 0 refills | Status: DC

## 2019-01-09 MED ORDER — PRAMOXINE HCL (PERIANAL) 1 % EX FOAM: 1.0000 "application " | Freq: Three times a day (TID) | CUTANEOUS | 0 refills | Status: DC | PRN

## 2019-01-09 NOTE — Progress Notes (Signed)
.  Virtual Visit via Video Note  I connected with Jody [name] on 01/09/19 at  2:00 PM EDT by a video enabled telemedicine application and verified that I am speaking with the correct person using two identifiers.   I discussed the limitations of evaluation and management by telemedicine and the availability of in person appointments. The patient expressed understanding and agreed to proceed.  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 Anemia   . Anxiety   . Asthma   . Depression   . GERD (gastroesophageal reflux disease)   . History of anal fissures   . History of gallstones   . Migraines   . Obesity    Outpatient Encounter Medications as of 01/09/2019  Medication Sig  . fluconazole (DIFLUCAN) 150 MG tablet 1 po x1, may repeat in 3 days prn  . hydrochlorothiazide (HYDRODIURIL) 25 MG tablet TAKE 1 TABLET (25 MG TOTAL) BY MOUTH DAILY AS NEEDED  . hydrocortisone (ANUSOL-HC) 25 MG suppository Place 1 suppository (25 mg total) rectally 2 (two) times daily.  Marland Kitchen LORazepam (ATIVAN) 0.5 MG tablet Take 1 tablet (0.5 mg total) by mouth 2 (two) times daily as needed for anxiety.  . metoprolol tartrate (LOPRESSOR) 50 MG tablet Take 1 tablet (50 mg total) by mouth 2 (two) times daily.  . pramoxine (PROCTOFOAM) 1 % foam Place 1 application rectally 3 (three) times daily as needed for anal itching.  . VENTOLIN HFA 108 (90 Base) MCG/ACT inhaler TAKE 2 PUFFS BY MOUTH EVERY 6 HOURS AS NEEDED FOR WHEEZE OR SHORTNESS OF BREATH   No facility-administered encounter medications on file as of 01/09/2019.    Observations/Objective: No vitals due to video visit  Pt is in NAD   Assessment and Plan: 1. External hemorrhoid [employer] fiber in diet  anusol and procotfoam Refer to gi if no better  - hydrocortisone  (ANUSOL-HC) 25 MG suppository; Place 1 suppository (25 mg total) rectally 2 (two) times daily.  Dispense: 24 suppository; Refill: 0 - pramoxine (PROCTOFOAM) 1 % foam; Place 1 application rectally 3 (three) times daily as needed for anal itching.  Dispense: 15 g; Refill: 0   Follow Up Instructions:    I discussed the assessment and treatment plan with the patient. The patient was provided an opportunity to ask questions and all were answered. The patient agreed with the plan and demonstrated an understanding of the instructions.   The patient was advised to call back or seek an in-person evaluation if the symptoms worsen or if the condition fails to improve as anticipated.  I provided 15 minutes of non-face-to-face time during this encounter.   Donato Schultz, DO

## 2019-04-27 ENCOUNTER — Other Ambulatory Visit: Payer: Self-pay

## 2019-04-28 ENCOUNTER — Ambulatory Visit: Payer: BC Managed Care – PPO | Admitting: Family Medicine

## 2019-04-28 ENCOUNTER — Encounter: Payer: Self-pay | Admitting: Family Medicine

## 2019-04-28 ENCOUNTER — Ambulatory Visit (HOSPITAL_BASED_OUTPATIENT_CLINIC_OR_DEPARTMENT_OTHER)
Admission: RE | Admit: 2019-04-28 | Discharge: 2019-04-28 | Disposition: A | Discharge status: Home / Self Care | Payer: BC Managed Care – PPO | Source: Ambulatory Visit | Attending: Family Medicine | Admitting: Family Medicine

## 2019-04-28 VITALS — BP 145/89 | HR 89 | Temp 97.5°F | Resp 18 | Ht 66.0 in | Wt 291.2 lb

## 2019-04-28 DIAGNOSIS — M5440 Lumbago with sciatica, unspecified side: Secondary | ICD-10-CM

## 2019-04-28 DIAGNOSIS — M545 Low back pain, unspecified: Secondary | ICD-10-CM

## 2019-04-28 DIAGNOSIS — K644 Residual hemorrhoidal skin tags: Secondary | ICD-10-CM | POA: Diagnosis not present

## 2019-04-28 MED ORDER — HYDROCORTISONE ACETATE 25 MG RE SUPP: 25.0000 mg | Freq: Two times a day (BID) | RECTAL | 0 refills | Status: DC

## 2019-04-28 MED ORDER — CYCLOBENZAPRINE HCL 10 MG PO TABS: 10.0000 mg | ORAL_TABLET | Freq: Three times a day (TID) | ORAL | 0 refills | Status: DC | PRN

## 2019-04-28 MED ORDER — TRAMADOL HCL 50 MG PO TABS: 50.0000 mg | ORAL_TABLET | Freq: Three times a day (TID) | ORAL | 0 refills | Status: AC | PRN

## 2019-04-28 NOTE — Assessment & Plan Note (Signed)
Muscle relaxer and pain meds Check xray Consider mri vs PT  rto 2 weeks or sooner prn

## 2019-04-28 NOTE — Progress Notes (Signed)
Patient ID: Jody [name], female    DOB: 12-05-1970  Age: 48 y.o. MRN: [id number]    Subjective:  Subjective  HPI [name] presents for low back pain radiates down right leg to the shin.  No known injury.  Her r leg feels weak.     Review of Systems  Constitutional: Negative for appetite change, diaphoresis, fatigue and unexpected weight change.  Eyes: Negative for pain, redness and visual disturbance.  Respiratory: Negative for cough, chest tightness, shortness of breath and wheezing.   Cardiovascular: Negative for chest pain, palpitations and leg swelling.  Endocrine: Negative for cold intolerance, heat intolerance, polydipsia, polyphagia and polyuria.  Genitourinary: Negative for difficulty urinating, dysuria and frequency.  Musculoskeletal: Positive for back pain.  Neurological: Positive for weakness. Negative for dizziness, light-headedness, numbness and headaches.    History Past Medical History:  Diagnosis Date  . Anemia   . Anxiety   . Asthma   . Depression   . GERD (gastroesophageal reflux disease)   . History of anal fissures   . History of gallstones   . Migraines   . Obesity     She has a past surgical history that includes Cholecystectomy; Tubal ligation; and Flexible sigmoidoscopy (03/09/2012).   Her family history includes Arthritis in her father; Breast cancer in her mother; Cataracts in her maternal grandmother; Coronary artery disease in her maternal grandmother; Diabetes in her father; Glaucoma in her maternal grandmother; Heart attack (age of onset: 29) in her father; Heart disease in her maternal grandmother; Hepatitis (age of onset: 16) in her father; Hypertension in her father and mother; Kidney disease in her father; Obesity in her unknown relative.She reports that she has never smoked. She has never used smokeless tobacco. She reports that she does not drink alcohol or use drugs.  Current Outpatient Medications on File Prior to Visit   Medication Sig Dispense Refill  . hydrochlorothiazide (HYDRODIURIL) 25 MG tablet TAKE 1 TABLET (25 MG TOTAL) BY MOUTH DAILY AS NEEDED 30 tablet 9  . LORazepam (ATIVAN) 0.5 MG tablet Take 1 tablet (0.5 mg total) by mouth 2 (two) times daily as needed for anxiety. 30 tablet 1  . metoprolol tartrate (LOPRESSOR) 50 MG tablet Take 1 tablet (50 mg total) by mouth 2 (two) times daily. 180 tablet 1  . pramoxine (PROCTOFOAM) 1 % foam Place 1 application rectally 3 (three) times daily as needed for anal itching. 15 g 0  . VENTOLIN HFA 108 (90 Base) MCG/ACT inhaler TAKE 2 PUFFS BY MOUTH EVERY 6 HOURS AS NEEDED FOR WHEEZE OR SHORTNESS OF BREATH 18 Inhaler 2  . fluconazole (DIFLUCAN) 150 MG tablet 1 po x1, may repeat in 3 days prn (Patient not taking: Reported on 04/28/2019) 2 tablet 0   No current facility-administered medications on file prior to visit.      Objective:  Objective  Physical Exam Vitals signs and nursing note reviewed.  Constitutional:      Appearance: She is well-developed.  HENT:     Head: Normocephalic and atraumatic.  Eyes:     Conjunctiva/sclera: Conjunctivae normal.  Neck:     Musculoskeletal: Normal range of motion and neck supple.     Thyroid: No thyromegaly.     Vascular: No carotid bruit or JVD.  Cardiovascular:     Rate and Rhythm: Normal rate and regular rhythm.     Heart sounds: Normal heart sounds. No murmur.  Pulmonary:     Effort: Pulmonary effort is normal. No respiratory distress.  Breath sounds: Normal breath sounds. No wheezing or rales.  Chest:     Chest wall: No tenderness.  Neurological:     Mental Status: She is alert and oriented to person, place, and time.     Sensory: Sensory deficit: 3/5.     Comments: R hip flexor 3/5 L hip flexor normal 3/5 R low leg extension  L normal     BP (!) 145/89 (BP Location: Left Arm, Patient Position: Sitting, Cuff Size: Normal)   Pulse 89   Temp (!) 97.5 F (36.4 C) (Temporal)   Resp 18   Ht 5\' 6"  (1.676  m)   Wt 291 lb 3.2 oz (132.1 kg)   SpO2 100%   BMI 47.00 kg/m  Wt Readings from Last 3 Encounters:  04/28/19 291 lb 3.2 oz (132.1 kg)  07/25/18 (!) 301 lb 12.8 oz (136.9 kg)  01/06/18 290 lb (131.5 kg)     Lab Results  Component Value Date   WBC 6.0 07/25/2018   HGB 11.8 (L) 07/25/2018   HCT 36.0 07/25/2018   PLT 246.0 07/25/2018   GLUCOSE 96 07/25/2018   CHOL 134 07/25/2018   TRIG 55.0 07/25/2018   HDL 42.60 07/25/2018   LDLCALC 80 07/25/2018   ALT 7 07/25/2018   AST 11 07/25/2018   NA 138 07/25/2018   K 4.0 07/25/2018   CL 105 07/25/2018   CREATININE 0.78 07/25/2018   BUN 9 07/25/2018   CO2 28 07/25/2018   TSH 0.80 07/25/2018   INR 1.03 12/11/2010   HGBA1C 5.9 06/03/2015   MICROALBUR 1.1 05/14/2017    No results found.   Assessment & Plan:  Plan  I am having Avice S. Utke start on cyclobenzaprine and traMADol. I am also having her maintain her metoprolol tartrate, hydrochlorothiazide, LORazepam, Ventolin HFA, fluconazole, pramoxine, and hydrocortisone.  Meds ordered this encounter  Medications  . hydrocortisone (ANUSOL-HC) 25 MG suppository    Sig: Place 1 suppository (25 mg total) rectally 2 (two) times daily.    Dispense:  24 suppository    Refill:  0  . cyclobenzaprine (FLEXERIL) 10 MG tablet    Sig: Take 1 tablet (10 mg total) by mouth 3 (three) times daily as needed for muscle spasms.    Dispense:  30 tablet    Refill:  0  . traMADol (ULTRAM) 50 MG tablet    Sig: Take 1 tablet (50 mg total) by mouth every 8 (eight) hours as needed for up to 5 days.    Dispense:  15 tablet    Refill:  0    Problem List Items Addressed This Visit      Unprioritized   Low back pain with radiation - Primary    Muscle relaxer and pain meds Check xray Consider mri vs PT  rto 2 weeks or sooner prn       Relevant Medications   cyclobenzaprine (FLEXERIL) 10 MG tablet   traMADol (ULTRAM) 50 MG tablet   Other Relevant Orders   DG Lumbar Spine Complete     Other Visit Diagnoses    External hemorrhoid       Relevant Medications   hydrocortisone (ANUSOL-HC) 25 MG suppository      Follow-up: No follow-ups on file.  Donato SchultzYvonne R Lowne Chase, DO

## 2019-04-28 NOTE — Patient Instructions (Signed)
Acute Back Pain, Adult Acute back pain is sudden and usually short-lived. It is often caused by an injury to the muscles and tissues in the back. The injury may result from:  A muscle or ligament getting overstretched or torn (strained). Ligaments are tissues that connect bones to each other. Lifting something improperly can cause a back strain.  Wear and tear (degeneration) of the spinal disks. Spinal disks are circular tissue that provides cushioning between the bones of the spine (vertebrae).  Twisting motions, such as while playing sports or doing yard work.  A hit to the back.  Arthritis. You may have a physical exam, lab tests, and imaging tests to find the cause of your pain. Acute back pain usually goes away with rest and home care. Follow these instructions at home: Managing pain, stiffness, and swelling  Take over-the-counter and prescription medicines only as told by your health care provider.  Your health care provider may recommend applying ice during the first 24-48 hours after your pain starts. To do this: ? Put ice in a plastic bag. ? Place a towel between your skin and the bag. ? Leave the ice on for 20 minutes, 2-3 times a day.  If directed, apply heat to the affected area as often as told by your health care provider. Use the heat source that your health care provider recommends, such as a moist heat pack or a heating pad. ? Place a towel between your skin and the heat source. ? Leave the heat on for 20-30 minutes. ? Remove the heat if your skin turns bright red. This is especially important if you are unable to feel pain, heat, or cold. You have a greater risk of getting burned. Activity   Do not stay in bed. Staying in bed for more than 1-2 days can delay your recovery.  Sit up and stand up straight. Avoid leaning forward when you sit, or hunching over when you stand. ? If you work at a desk, sit close to it so you do not need to lean over. Keep your chin tucked  in. Keep your neck drawn back, and keep your elbows bent at a right angle. Your arms should look like the letter "L." ? Sit high and close to the steering wheel when you drive. Add lower back (lumbar) support to your car seat, if needed.  Take short walks on even surfaces as soon as you are able. Try to increase the length of time you walk each day.  Do not sit, drive, or stand in one place for more than 30 minutes at a time. Sitting or standing for long periods of time can put stress on your back.  Do not drive or use heavy machinery while taking prescription pain medicine.  Use proper lifting techniques. When you bend and lift, use positions that put less stress on your back: ? Bend your knees. ? Keep the load close to your body. ? Avoid twisting.  Exercise regularly as told by your health care provider. Exercising helps your back heal faster and helps prevent back injuries by keeping muscles strong and flexible.  Work with a physical therapist to make a safe exercise program, as recommended by your health care provider. Do any exercises as told by your physical therapist. Lifestyle  Maintain a healthy weight. Extra weight puts stress on your back and makes it difficult to have good posture.  Avoid activities or situations that make you feel anxious or stressed. Stress and anxiety increase muscle   tension and can make back pain worse. Learn ways to manage anxiety and stress, such as through exercise. General instructions  Sleep on a firm mattress in a comfortable position. Try lying on your side with your knees slightly bent. If you lie on your back, put a pillow under your knees.  Follow your treatment plan as told by your health care provider. This may include: ? Cognitive or behavioral therapy. ? Acupuncture or massage therapy. ? Meditation or yoga. Contact a health care provider if:  You have pain that is not relieved with rest or medicine.  You have increasing pain going down  into your legs or buttocks.  Your pain does not improve after 2 weeks.  You have pain at night.  You lose weight without trying.  You have a fever or chills. Get help right away if:  You develop new bowel or bladder control problems.  You have unusual weakness or numbness in your arms or legs.  You develop nausea or vomiting.  You develop abdominal pain.  You feel faint. Summary  Acute back pain is sudden and usually short-lived.  Use proper lifting techniques. When you bend and lift, use positions that put less stress on your back.  Take over-the-counter and prescription medicines and apply heat or ice as directed by your health care provider. This information is not intended to replace advice given to you by your health care provider. Make sure you discuss any questions you have with your health care provider. Document Released: 08/31/2005 Document Revised: 12/20/2018 Document Reviewed: 04/14/2017 Elsevier Patient Education  2020 Else[employer] Inc.  

## 2019-05-11 ENCOUNTER — Ambulatory Visit: Payer: Self-pay | Admitting: *Deleted

## 2019-05-11 NOTE — Telephone Encounter (Signed)
Patient calls with questions regarding recent spine xrays taken on 04/28/19. She questions if the Essure she has had inserted may have moved or migrated and could be causing her lower back pain. Reviewed the lower lumbar spondylosis with patient. Stated understanding explanation but also questions if the Essure she has had placed could be contributing to her back pain? She would like provider's thoughts. Also, she is requesting for these xrays to be faxed to Dr. Sharen Counter at fax # [phone] who specializes in the Essure.  She would like provider's response via MyChart, please.

## 2019-05-11 NOTE — Telephone Encounter (Signed)
It does not say in the xray that the essure is in the wrong place but we can send the xray to her gyn

## 2019-05-11 NOTE — Telephone Encounter (Signed)
Please review thread. Pt would like a response via Mychart.

## 2019-05-11 NOTE — Telephone Encounter (Signed)
  Reason for Disposition . [1] Caller requesting NON-URGENT health information AND [2] PCP's office is the best resource  Answer Assessment - Initial Assessment Questions 1. REASON FOR CALL or QUESTION: "What is your reason for calling today?" or "How can I best help you?" or "What question do you have that I can help answer?"     Questions regarding spine film results.  Protocols used: INFORMATION ONLY CALL-A-AH

## 2019-05-12 NOTE — Telephone Encounter (Signed)
Xray faxed to GYN and received confirmation

## 2019-05-15 ENCOUNTER — Telehealth: Payer: Self-pay | Admitting: *Deleted

## 2019-05-15 NOTE — Telephone Encounter (Signed)
Copied from [facility] [phone]. Topic: General - Inquiry >> May 15, 2019  1:20 PM Reyne Dumas L wrote: Reason for CRM:   See Nurse Triage note from last week.  Pt states that she is still waiting for a response via MyChart about x-ray.  Pt wants to make sure this has not been overlooked.  Pt can be reached at [phone]

## 2019-05-16 ENCOUNTER — Telehealth: Payer: Self-pay | Admitting: *Deleted

## 2019-05-16 NOTE — Telephone Encounter (Signed)
We got a returned fax stating we sent to wrong office.  Xray resent to office with right Dr. Name on it.    It is suppose to be to Dr. Josefa Half attn: Wynelle Link [phone].  Will call patient after confirmation is received.

## 2019-05-16 NOTE — Telephone Encounter (Signed)
Error

## 2019-05-16 NOTE — Telephone Encounter (Signed)
Patient notified that fax confirmation was received

## 2019-05-17 ENCOUNTER — Other Ambulatory Visit: Payer: Self-pay

## 2019-05-19 ENCOUNTER — Encounter: Payer: Self-pay | Admitting: Obstetrics and Gynecology

## 2019-05-19 ENCOUNTER — Other Ambulatory Visit: Payer: Self-pay

## 2019-05-19 ENCOUNTER — Ambulatory Visit: Payer: BC Managed Care – PPO | Admitting: Obstetrics and Gynecology

## 2019-05-19 VITALS — BP 132/84 | HR 88 | Temp 97.2°F | Resp 14 | Ht 65.0 in | Wt 297.4 lb

## 2019-05-19 DIAGNOSIS — R102 Pelvic and perineal pain: Secondary | ICD-10-CM | POA: Diagnosis not present

## 2019-05-19 DIAGNOSIS — N946 Dysmenorrhea, unspecified: Secondary | ICD-10-CM

## 2019-05-19 DIAGNOSIS — R300 Dysuria: Secondary | ICD-10-CM

## 2019-05-19 LAB — POCT URINALYSIS DIPSTICK
Bilirubin, UA: NEGATIVE
Blood, UA: NEGATIVE
Glucose, UA: NEGATIVE
Ketones, UA: NEGATIVE
Leukocytes, UA: NEGATIVE
Nitrite, UA: NEGATIVE
Protein, UA: NEGATIVE
Urobilinogen, UA: 1 E.U./dL
pH, UA: 6 (ref 5.0–8.0)

## 2019-05-19 NOTE — Patient Instructions (Addendum)
Pelvic Pain, Female Pelvic pain is pain in your lower abdomen, below your belly button and between your hips. The pain may start suddenly (be acute), keep coming back (be recurring), or last a long time (become chronic). Pelvic pain that lasts longer than 6 months is considered chronic. Pelvic pain may affect your:  Reproductive organs.  Urinary system.  Digestive tract.  Musculoskeletal system. There are many potential causes of pelvic pain. Sometimes, the pain can be a result of digestive or urinary conditions, strained muscles or ligaments, or reproductive conditions. Sometimes the cause of pelvic pain is not known. Follow these instructions at home:   Take over-the-counter and prescription medicines only as told by your health care provider.  Rest as told by your health care provider.  Do not have sex if it hurts.  Keep a journal of your pelvic pain. Write down: ? When the pain started. ? Where the pain is located. ? What seems to make the pain better or worse, such as food or your period (menstrual cycle). ? Any symptoms you have along with the pain.  Keep all follow-up visits as told by your health care provider. This is important. Contact a health care provider if:  Medicine does not help your pain.  Your pain comes back.  You have new symptoms.  You have abnormal vaginal discharge or bleeding, including bleeding after menopause.  You have a fever or chills.  You are constipated.  You have blood in your urine or stool.  You have foul-smelling urine.  You feel weak or light-headed. Get help right away if:  You have sudden severe pain.  Your pain gets steadily worse.  You have severe pain along with fever, nausea, vomiting, or excessive sweating.  You lose consciousness. Summary  Pelvic pain is pain in your lower abdomen, below your belly button and between your hips.  There are many potential causes of pelvic pain.  Keep a journal of your pelvic  pain. This information is not intended to replace advice given to you by your health care provider. Make sure you discuss any questions you have with your health care provider. Document Released: 07/28/2004 Document Revised: 02/16/2018 Document Reviewed: 02/16/2018 Elsevier Patient Education  2020 Elsevier Inc.  Dysmenorrhea  Dysmenorrhea refers to cramps caused by the muscles of the uterus tightening (contracting) during a menstrual period. Dysmenorrhea may be mild, or it may be severe enough to interfere with everyday activities for a few days each month. Primary dysmenorrhea is menstrual cramps that last a couple of days when you start having menstrual periods or soon after. This often begins after a teenager starts having her period. As a woman gets older or has a baby, the cramps will usually lessen or disappear. Secondary dysmenorrhea begins later in life and is caused by a disorder in the reproductive system. It lasts longer, and it may cause more pain than primary dysmenorrhea. The pain may start before the period and last a few days after the period. What are the causes? Dysmenorrhea is usually caused by an underlying problem, such as:  The tissue that lines the uterus (endometrium) growing outside of the uterus in other areas of the body (endometriosis).  Endometrial tissue growing into the muscular walls of the uterus (adenomyosis).  Blood vessels in the pelvis becoming filled with blood just before the menstrual period (pelvic congestive syndrome).  Overgrowth of cells (polyps) in the endometrium or the lower part of the uterus (cervix).  The uterus dropping down into the  vagina (prolapse) due to stretched or weak muscles.  Bladder problems, such as infection or inflammation.  Intestinal problems, such as a tumor or irritable bowel syndrome.  Cancer of the reproductive organs or bladder.  A severely tipped uterus.  A cervix that is closed or has a very small opening.   Noncancerous (benign) tumors of the uterus (fibroids).  Pelvic inflammatory disease (PID).  Pelvic scarring (adhesions) from a previous surgery.  An ovarian cyst.  An IUD (intrauterine device). What increases the risk? You are more likely to develop this condition if:  You are younger than age 75.  You started puberty early.  You have irregular or heavy bleeding.  You have never given birth.  You have a family history of dysmenorrhea.  You smoke. What are the signs or symptoms? Symptoms of this condition include:  Cramping, throbbing pain, or a feeling of fullness in the lower abdomen.  Lower back pain.  Periods lasting for longer than 7 days.  Headaches.  Bloating.  Fatigue.  Nausea or vomiting.  Diarrhea.  Sweating or dizziness.  Loose stools. How is this diagnosed? This condition may be diagnosed based on:  Your symptoms.  Your medical history.  A physical exam.  Blood tests.  A Pap test. This is a test in which cells from the cervix are tested for signs of cancer or infection.  A pregnancy test.  Imaging tests, such as: ? Ultrasound. ? A procedure to remove and examine a sample of endometrial tissue (dilation and curettage, D&C). ? A procedure to visually examine the inside of:  The uterus (hysteroscopy).  The abdomen or pelvis (laparoscopy).  The bladder (cystoscopy).  The intestine (colonoscopy).  The stomach (gastroscopy). ? X-rays. ? CT scan. ? MRI. How is this treated? Treatment depends on the cause of the dysmenorrhea. Treatment may include:  Pain medicine prescribed by your health care provider.  Birth control pills that contain the hormone progesterone.  An IUD that contains the hormone progesterone.  Medicines to control bleeding.  Hormone replacement therapy.  NSAIDs. These may help to stop the production of hormones that cause cramps.  Antidepressant medicines.  Surgery to remove adhesions, endometriosis,  ovarian cysts, fibroids, or the entire uterus (hysterectomy).  Injections of progesterone to stop the menstrual period.  A procedure to destroy the endometrium (endometrial ablation).  A procedure to cut the nerves in the bottom of the spine (sacrum) that go to the reproductive organs (presacral neurectomy).  A procedure to apply an electric current to nerves in the sacrum (sacral nerve stimulation).  Exercise and physical therapy.  Meditation and yoga therapy.  Acupuncture. Work with your health care provider to determine what treatment or combination of treatments is best for you. Follow these instructions at home: Relieving pain and cramping  Apply heat to your lower back or abdomen when you experience pain or cramps. Use the heat source that your health care provider recommends, such as a moist heat pack or a heating pad. ? Place a towel between your skin and the heat source. ? Leave the heat on for 20-30 minutes. ? Remove the heat if your skin turns bright red. This is especially important if you are unable to feel pain, heat, or cold. You may have a greater risk of getting burned. ? Do not sleep with a heating pad on.  Do aerobic exercises, such as walking, swimming, or biking. This can help to relieve cramps.  Massage your lower back or abdomen to help relieve pain. General  instructions  Take over-the-counter and prescription medicines only as told by your health care provider.  Do not drive or use heavy machinery while taking prescription pain medicine.  Avoid alcohol and caffeine during and right before your menstrual period. These can make cramps worse.  Do not use any products that contain nicotine or tobacco, such as cigarettes and e-cigarettes. If you need help quitting, ask your health care provider.  Keep all follow-up visits as told by your health care provider. This is important. Contact a health care provider if:  You have pain that gets worse or does not  get better with medicine.  You have pain with sex.  You develop nausea or vomiting with your period that is not controlled with medicine. Get help right away if:  You faint. Summary  Dysmenorrhea refers to cramps caused by the muscles of the uterus tightening (contracting) during a menstrual period.  Dysmenorrhea may be mild, or it may be severe enough to interfere with everyday activities for a few days each month.  Treatment depends on the cause of the dysmenorrhea.  Work with your health care provider to determine what treatment or combination of treatments is best for you. This information is not intended to replace advice given to you by your health care provider. Make sure you discuss any questions you have with your health care provider. Document Released: 08/31/2005 Document Revised: 08/13/2017 Document Reviewed: 10/03/2016 Elsevier Patient Education  2020 [employer].

## 2019-05-19 NOTE — Progress Notes (Signed)
GYNECOLOGY  VISIT   HPI: 48 y.o.   Single  African American  female   [id number] with Patient's last menstrual period was 05/01/2019 (within days).   Here as a new patient referred from Dr. Seabron Spates (PCP) for lower back pain with Essure. Patient also complains of having pelvic pain and heavy menses.   She is having lower back pain started suddenly one month ago.  She had some x-rays done.  She is concerned about the position of her Essure device, which was placed in 2009 by Dr. Zelphia Cairo.  She did have the follow up HSG, and she had tubal obstruction.   She is also having abdominal and pelvic pain and pressure "for a while."  She states for a couple of years.  She has sharp pains.  Reports her cycles are monthly and getting heavier. They last 5 days, used to be 7 days.  Using tampons and a pad together, and she changes them every 2.5 - 3 hours.  Takes ibuprofen for her painful menses. The pain wakes her up and feels like contractions.   Not sexually active for over 7 years.   States she has brain fog.   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   Urine dip - negative.  GYNECOLOGIC HISTORY: Patient's last menstrual period was 05/01/2019 (within days). Contraception: Essure Menopausal hormone therapy:  none Last mammogram:  08/02/15 BIRADS 1 negative/density b Last pap smear:   07/25/18 Neg:Neg HR HPV - PCP.  Always had normal paps.         OB History    Gravida  3   Para  3   Term  0   Preterm  0   AB  0   Living  3     SAB  0   TAB  0   Ectopic  0   Multiple  0   Live Births  0              Patient Active Problem List   Diagnosis Date Noted  . Low back pain with radiation 04/28/2019  . Stress reaction 05/16/2017  . Essential hypertension 06/18/2015  . Hyperglycemia 06/18/2015  . Hemorrhoids 01/31/2012  . Fatigue 12/31/2011  . Abdominal pain, epigastric 12/31/2011  . B12 deficiency 01/13/2011   . Anxiety and depression 12/26/2010  . Migraines 12/15/2010  . Stress 12/15/2010  . OBESITY, MORBID 04/04/2007    Past Medical History:  Diagnosis Date  . Anemia   . Anxiety   . Asthma   . Depression   . GERD (gastroesophageal reflux disease)   . History of anal fissures   . History of gallstones   . Hypertension   . Migraine headache with aura   . Migraines   . Obesity     Past Surgical History:  Procedure Laterality Date  . CHOLECYSTECTOMY    . FLEXIBLE SIGMOIDOSCOPY  03/09/2012   Procedure: FLEXIBLE SIGMOIDOSCOPY;  Surgeon: Louis Meckel, MD;  Location: [facility];  Service: Endoscopy;  Laterality: N/A;  . TUBAL LIGATION     Essure    Current Outpatient Medications  Medication Sig Dispense Refill  . cyclobenzaprine (FLEXERIL) 10 MG tablet Take 1 tablet (10 mg total) by mouth 3 (three) times daily as needed for muscle spasms. 30 tablet 0  . fluticasone (FLONASE) 50 MCG/ACT nasal spray SPRAY 2 SPRAYS INTO EACH NOSTRIL EVERY DAY    . hydrochlorothiazide (HYDRODIURIL) 25 MG tablet TAKE 1  TABLET (25 MG TOTAL) BY MOUTH DAILY AS NEEDED 30 tablet 9  . hydrocortisone (ANUSOL-HC) 25 MG suppository Place 1 suppository (25 mg total) rectally 2 (two) times daily. 24 suppository 0  . LORazepam (ATIVAN) 0.5 MG tablet Take 1 tablet (0.5 mg total) by mouth 2 (two) times daily as needed for anxiety. 30 tablet 1  . metoprolol tartrate (LOPRESSOR) 50 MG tablet Take 1 tablet (50 mg total) by mouth 2 (two) times daily. 180 tablet 1  . Multiple Vitamin (MULTIVITAMIN) tablet Take 1 tablet by mouth daily.    . VENTOLIN HFA 108 (90 Base) MCG/ACT inhaler TAKE 2 PUFFS BY MOUTH EVERY 6 HOURS AS NEEDED FOR WHEEZE OR SHORTNESS OF BREATH 18 Inhaler 2  . fluconazole (DIFLUCAN) 150 MG tablet 1 po x1, may repeat in 3 days prn (Patient not taking: Reported on 04/28/2019) 2 tablet 0   No current facility-administered medications for this visit.      ALLERGIES: Patient has no known allergies.  Family  History  Problem Relation Age of Onset  . Breast cancer Mother   . Hypertension Mother   . Hepatitis Father 26       Hep C  . Diabetes Father   . Arthritis Father   . Hypertension Father   . Kidney disease Father   . Heart attack Father 16  . Coronary artery disease Maternal Grandmother   . Cataracts Maternal Grandmother   . Glaucoma Maternal Grandmother   . Heart disease Maternal Grandmother   . Obesity Other   . Obesity Paternal Grandmother     Social History   Socioeconomic History  . Marital status: Single    Spouse name: Not on file  . Number of children: 3  . Years of education: Not on file  . Highest education level: Not on file  Occupational History  . Occupation: disability    Employer: [employer]  Social Needs  . Financial resource strain: Not on file  . Food insecurity    Worry: Not on file    Inability: Not on file  . Transportation needs    Medical: Not on file    Non-medical: Not on file  Tobacco Use  . Smoking status: Never Smoker  . Smokeless tobacco: Never Used  Substance and Sexual Activity  . Alcohol use: No  . Drug use: No  . Sexual activity: Not Currently    Partners: Male    Comment: Essure  Lifestyle  . Physical activity    Days per week: Not on file    Minutes per session: Not on file  . Stress: Not on file  Relationships  . Social Herbalist on phone: Not on file    Gets together: Not on file    Attends religious service: Not on file    Active member of club or organization: Not on file    Attends meetings of clubs or organizations: Not on file    Relationship status: Not on file  . Intimate partner violence    Fear of current or ex partner: Not on file    Emotionally abused: Not on file    Physically abused: Not on file    Forced sexual activity: Not on file  Other Topics Concern  . Not on file  Social History Narrative   Exercise-- trying to do some steps    Review of Systems  Constitutional:  Negative.   HENT: Negative.   Eyes: Negative.   Respiratory: Negative.  Cardiovascular: Negative.   Gastrointestinal: Negative.   Endocrine: Negative.   Genitourinary: Positive for dysuria and pelvic pain.       Pressure with urination  Musculoskeletal:       Lower back pain  Skin: Negative.   Allergic/Immunologic: Negative.   Neurological: Negative.   Hematological: Negative.   Psychiatric/Behavioral: Negative.     PHYSICAL EXAMINATION:    BP 132/84 (BP Location: Right Arm, Patient Position: Sitting, Cuff Size: Large)   Pulse 88   Temp (!) 97.2 F (36.2 C) (Temporal)   Resp 14   Ht 5\' 5"  (1.651 m)   Wt 297 lb 6.4 oz (134.9 kg)   LMP 05/01/2019 (Within Days)   BMI 49.49 kg/m     General appearance: alert, cooperative and appears stated age Head: Normocephalic, without obvious abnormality, atraumatic Neck: no adenopathy, supple, symmetrical, trachea midline and thyroid normal to inspection and palpation Lungs: clear to auscultation bilaterally Heart: regular rate and rhythm Abdomen: soft, non-tender, no masses,  no organomegaly Extremities: extremities normal, atraumatic, no cyanosis or edema Skin: Skin color, texture, turgor normal. No rashes or lesions Lymph nodes: Cervical, supraclavicular, and axillary nodes normal. No abnormal inguinal nodes palpated Neurologic: Grossly normal  Pelvic: External genitalia:  no lesions              Urethra:  normal appearing urethra with no masses, tenderness or lesions              Bartholins and Skenes: normal                 Vagina: normal appearing vagina with normal color and discharge, no lesions              Cervix: no lesions                Bimanual Exam:  Uterus:  normal size, contour, position, consistency, mobility, non-tender              Adnexa: no mass, fullness, tenderness              Rectal exam: Yes.  .  Confirms.              Anus:  normal sphincter tone, no lesions  Chaperone was present for  exam.  ASSESSMENT  Pelvic pain, dysmenorrhea, and low back pain.  Status post Essure.  Migraine HA with aura.  Dysuria.   PLAN  Return for pelvic US.  We discussed pelvic pain and potential etiologies - endometriosis, ovarian cysts, infection. We reviewed Essure.  Urine dip:  Negative. She will update her mammogram.   An After Visit Summary was printed and given to the patient.  ___30___ minutes face to face time of which over 50% was spent in counseling.

## 2019-05-28 ENCOUNTER — Other Ambulatory Visit: Payer: Self-pay | Admitting: Family Medicine

## 2019-05-28 DIAGNOSIS — I10 Essential (primary) hypertension: Secondary | ICD-10-CM

## 2019-06-06 ENCOUNTER — Other Ambulatory Visit: Payer: Self-pay

## 2019-06-08 ENCOUNTER — Encounter: Payer: Self-pay | Admitting: Obstetrics and Gynecology

## 2019-06-08 ENCOUNTER — Ambulatory Visit (INDEPENDENT_AMBULATORY_CARE_PROVIDER_SITE_OTHER): Payer: BC Managed Care – PPO

## 2019-06-08 ENCOUNTER — Other Ambulatory Visit: Payer: Self-pay

## 2019-06-08 ENCOUNTER — Ambulatory Visit: Payer: BC Managed Care – PPO | Admitting: Obstetrics and Gynecology

## 2019-06-08 VITALS — BP 144/90 | HR 86 | Temp 97.3°F | Resp 18 | Ht 67.0 in | Wt 300.2 lb

## 2019-06-08 DIAGNOSIS — N946 Dysmenorrhea, unspecified: Secondary | ICD-10-CM | POA: Diagnosis not present

## 2019-06-08 DIAGNOSIS — N920 Excessive and frequent menstruation with regular cycle: Secondary | ICD-10-CM

## 2019-06-08 DIAGNOSIS — R102 Pelvic and perineal pain: Secondary | ICD-10-CM

## 2019-06-08 NOTE — Patient Instructions (Signed)
Total Laparoscopic Hysterectomy, Care After This sheet gives you information about how to care for yourself after your procedure. Your health care provider may also give you more specific instructions. If you have problems or questions, contact your health care provider. What can I expect after the procedure? After the procedure, it is common to have:  Pain and bruising around your incisions.  A sore throat, if a breathing tube was used during surgery.  Fatigue.  Poor appetite.  Less interest in sex. If your ovaries were also removed, it is also common to have symptoms of menopause such as hot flashes, night sweats, and lack of sleep (insomnia). Follow these instructions at home: Bathing  Do not take baths, swim, or use a hot tub until your health care provider approves. You may need to only take showers for 2-3 weeks.  Keep your bandage (dressing) dry until your health care provider says it can be removed. Incision care   Follow instructions from your health care provider about how to take care of your incisions. Make sure you: ? Wash your hands with soap and water before you change your dressing. If soap and water are not available, use hand sanitizer. ? Change your dressing as told by your health care provider. ? Leave stitches (sutures), skin glue, or adhesive strips in place. These skin closures may need to stay in place for 2 weeks or longer. If adhesive strip edges start to loosen and curl up, you may trim the loose edges. Do not remove adhesive strips completely unless your health care provider tells you to do that.  Check your incision area every day for signs of infection. Check for: ? Redness, swelling, or pain. ? Fluid or blood. ? Warmth. ? Pus or a bad smell. Activity  Get plenty of rest and sleep.  Do not lift anything that is heavier than 10 lbs (4.5 kg) for one month after surgery, or as long as told by your health care provider.  Do not drive or use heavy  machinery while taking prescription pain medicine.  Do not drive for 24 hours if you were given a medicine to help you relax (sedative).  Return to your normal activities as told by your health care provider. Ask your health care provider what activities are safe for you. Lifestyle   Do not use any products that contain nicotine or tobacco, such as cigarettes and e-cigarettes. These can delay healing. If you need help quitting, ask your health care provider.  Do not drink alcohol until your health care provider approves. General instructions  Do not douche, use tampons, or have sex for at least 6 weeks, or as told by your health care provider.  Take over-the-counter and prescription medicines only as told by your health care provider.  To monitor yourself for a fever, take your temperature at least once a day during recovery.  If you struggle with physical or emotional changes after your procedure, speak with your health care provider or a therapist.  To prevent or treat constipation while you are taking prescription pain medicine, your health care provider may recommend that you: ? Drink enough fluid to keep your urine clear or pale yellow. ? Take over-the-counter or prescription medicines. ? Eat foods that are high in fiber, such as fresh fruits and vegetables, whole grains, and beans. ? Limit foods that are high in fat and processed sugars, such as fried and sweet foods.  Keep all follow-up visits as told by your health care provider.   This is important. Contact a health care provider if:  You have chills or a fever.  You have redness, swelling, or pain around an incision.  You have fluid or blood coming from an incision.  Your incision feels warm to the touch.  You have pus or a bad smell coming from an incision.  An incision breaks open.  You feel dizzy or light-headed.  You have pain or bleeding when you urinate.  You have diarrhea, nausea, or vomiting that does not  go away.  You have abnormal vaginal discharge.  You have a rash.  You have pain that does not get better with medicine. Get help right away if:  You have a fever and your symptoms suddenly get worse.  You have severe abdominal pain.  You have chest pain.  You have shortness of breath.  You faint.  You have pain, swelling, or redness on your leg.  You have heavy vaginal bleeding with blood clots. Summary  After the procedure it is common to have abdominal pain. Your provider will give you medication for this.  Do not take baths, swim, or use a hot tub until your health care provider approves.  Do not lift anything that is heavier than 10 lbs (4.5 kg) for one month after surgery, or as long as told by your health care provider.  Notify your provider if you have any signs or symptoms of infection after the procedure. This information is not intended to replace advice given to you by your health care provider. Make sure you discuss any questions you have with your health care provider. Document Released: 06/21/2013 Document Revised: 08/13/2017 Document Reviewed: 11/11/2016 Elsevier Patient Education  2020 [employer]. Levonorgestrel intrauterine device (IUD) What is this medicine? LEVONORGESTREL IUD (LEE voe nor jes trel) is a contraceptive (birth control) device. The device is placed inside the uterus by a healthcare professional. It is used to prevent pregnancy. This device can also be used to treat heavy bleeding that occurs during your period. This medicine may be used for other purposes; ask your health care provider or pharmacist if you have questions. COMMON BRAND NAME(S): Cameron Ali What should I tell my health care provider before I take this medicine? They need to know if you have any of these conditions:  abnormal Pap smear  cancer of the breast, uterus, or cervix  diabetes  endometritis  genital or pelvic infection now or in the past   have more than one sexual partner or your partner has more than one partner  heart disease  history of an ectopic or tubal pregnancy  immune system problems  IUD in place  liver disease or tumor  problems with blood clots or take blood-thinners  seizures  use intravenous drugs  uterus of unusual shape  vaginal bleeding that has not been explained  an unusual or allergic reaction to levonorgestrel, other hormones, silicone, or polyethylene, medicines, foods, dyes, or preservatives  pregnant or trying to get pregnant  breast-feeding How should I use this medicine? This device is placed inside the uterus by a health care professional. Talk to your pediatrician regarding the use of this medicine in children. Special care may be needed. Overdosage: If you think you have taken too much of this medicine contact a poison control center or emergency room at once. NOTE: This medicine is only for you. Do not share this medicine with others. What if I miss a dose? This does not apply. Depending on the brand  of device you have inserted, the device will need to be replaced every 3 to 6 years if you wish to continue using this type of birth control. What may interact with this medicine? Do not take this medicine with any of the following medications:  amprenavir  bosentan  fosamprenavir This medicine may also interact with the following medications:  aprepitant  armodafinil  barbiturate medicines for inducing sleep or treating seizures  bexarotene  boceprevir  griseofulvin  medicines to treat seizures like carbamazepine, ethotoin, felbamate, oxcarbazepine, phenytoin, topiramate  modafinil  pioglitazone  rifabutin  rifampin  rifapentine  some medicines to treat HIV infection like atazanavir, efavirenz, indinavir, lopinavir, nelfinavir, tipranavir, ritonavir  St. John's wort  warfarin This list may not describe all possible interactions. Give your health care  provider a list of all the medicines, herbs, non-prescription drugs, or dietary supplements you use. Also tell them if you smoke, drink alcohol, or use illegal drugs. Some items may interact with your medicine. What should I watch for while using this medicine? Visit your doctor or health care professional for regular check ups. See your doctor if you or your partner has sexual contact with others, becomes HIV positive, or gets a sexual transmitted disease. This product does not protect you against HIV infection (AIDS) or other sexually transmitted diseases. You can check the placement of the IUD yourself by reaching up to the top of your vagina with clean fingers to feel the threads. Do not pull on the threads. It is a good habit to check placement after each menstrual period. Call your doctor right away if you feel more of the IUD than just the threads or if you cannot feel the threads at all. The IUD may come out by itself. You may become pregnant if the device comes out. If you notice that the IUD has come out use a backup birth control method like condoms and call your health care provider. Using tampons will not change the position of the IUD and are okay to use during your period. This IUD can be safely scanned with magnetic resonance imaging (MRI) only under specific conditions. Before you have an MRI, tell your healthcare provider that you have an IUD in place, and which type of IUD you have in place. What side effects may I notice from receiving this medicine? Side effects that you should report to your doctor or health care professional as soon as possible:  allergic reactions like skin rash, itching or hives, swelling of the face, lips, or tongue  fever, flu-like symptoms  genital sores  high blood pressure  no menstrual period for 6 weeks during use  pain, swelling, warmth in the leg  pelvic pain or tenderness  severe or sudden headache  signs of pregnancy  stomach cramping   sudden shortness of breath  trouble with balance, talking, or walking  unusual vaginal bleeding, discharge  yellowing of the eyes or skin Side effects that usually do not require medical attention (report to your doctor or health care professional if they continue or are bothersome):  acne  breast pain  change in sex drive or performance  changes in weight  cramping, dizziness, or faintness while the device is being inserted  headache  irregular menstrual bleeding within first 3 to 6 months of use  nausea This list may not describe all possible side effects. Call your doctor for medical advice about side effects. You may report side effects to FDA at 1-800-FDA-1088. Where should I keep  my medicine? This does not apply. NOTE: This sheet is a summary. It may not cover all possible information. If you have questions about this medicine, talk to your doctor, pharmacist, or health care provider.  2020 Elsevier/Gold Standard (2018-07-12 13:22:01) Norethindrone tablets (contraception) What is this medicine? NORETHINDRONE (nor eth IN drone) is an oral contraceptive. The product contains a female hormone known as a progestin. It is used to prevent pregnancy. This medicine may be used for other purposes; ask your health care provider or pharmacist if you have questions. COMMON BRAND NAME(S): Camila, Deblitane 28-Day, [place]in, Heather, [place], [place], [place], Nor-QD, Nora-BE, Norlyroc, Ortho Micronor, [employer] 28-Day What should I tell my health care provider before I take this medicine? They need to know if you have any of these conditions:  blood vessel disease or blood clots  breast, cervical, or vaginal cancer  diabetes  heart disease  kidney disease  liver disease  mental depression  migraine  seizures  stroke  vaginal bleeding  an unusual or allergic reaction to norethindrone, other medicines, foods, dyes, or preservatives  pregnant or trying to get  pregnant  breast-feeding How should I use this medicine? Take this medicine by mouth with a glass of water. You may take it with or without food. Follow the directions on the prescription label. Take this medicine at the same time each day and in the order directed on the package. Do not take your medicine more often than directed. Contact your pediatrician regarding the use of this medicine in children. Special care may be needed. This medicine has been used in female children who have started having menstrual periods. A patient package insert for the product will be given with each prescription and refill. Read this sheet carefully each time. The sheet may change frequently. Overdosage: If you think you have taken too much of this medicine contact a poison control center or emergency room at once. NOTE: This medicine is only for you. Do not share this medicine with others. What if I miss a dose? Try not to miss a dose. Every time you miss a dose or take a dose late your chance of pregnancy increases. When 1 pill is missed (even if only 3 hours late), take the missed pill as soon as possible and continue taking a pill each day at the regular time (use a back up method of birth control for the next 48 hours). If more than 1 dose is missed, use an additional birth control method for the rest of your pill pack until menses occurs. Contact your health care professional if more than 1 dose has been missed. What may interact with this medicine? Do not take this medicine with any of the following medications:  amprenavir or fosamprenavir  bosentan This medicine may also interact with the following medications:  antibiotics or medicines for infections, especially rifampin, rifabutin, rifapentine, and griseofulvin, and possibly penicillins or tetracyclines  aprepitant  barbiturate medicines, such as phenobarbital  carbamazepine  felbamate  modafinil  oxcarbazepine  phenytoin  ritonavir or  other medicines for HIV infection or AIDS  St. John's wort  topiramate This list may not describe all possible interactions. Give your health care provider a list of all the medicines, herbs, non-prescription drugs, or dietary supplements you use. Also tell them if you smoke, drink alcohol, or use illegal drugs. Some items may interact with your medicine. What should I watch for while using this medicine? Visit your doctor or health care professional for regular checks on  your progress. You will need a regular breast and pelvic exam and Pap smear while on this medicine. Use an additional method of birth control during the first cycle that you take these tablets. If you have any reason to think you are pregnant, stop taking this medicine right away and contact your doctor or health care professional. If you are taking this medicine for hormone related problems, it may take several cycles of use to see improvement in your condition. This medicine does not protect you against HIV infection (AIDS) or any other sexually transmitted diseases. What side effects may I notice from receiving this medicine? Side effects that you should report to your doctor or health care professional as soon as possible:  breast tenderness or discharge  pain in the abdomen, chest, groin or leg  severe headache  skin rash, itching, or hives  sudden shortness of breath  unusually weak or tired  vision or speech problems  yellowing of skin or eyes Side effects that usually do not require medical attention (report to your doctor or health care professional if they continue or are bothersome):  changes in sexual desire  change in menstrual flow  facial hair growth  fluid retention and swelling  headache  irritability  nausea  weight gain or loss This list may not describe all possible side effects. Call your doctor for medical advice about side effects. You may report side effects to FDA at  1-800-FDA-1088. Where should I keep my medicine? Keep out of the reach of children. Store at room temperature between 15 and 30 degrees C (59 and 86 degrees F). Throw away any unused medicine after the expiration date. NOTE: This sheet is a summary. It may not cover all possible information. If you have questions about this medicine, talk to your doctor, pharmacist, or health care provider.  2020 Elsevier/Gold Standard (2012-05-20 16:41:35) Medroxyprogesterone injection [Contraceptive] What is this medicine? MEDROXYPROGESTERONE (me DROX ee proe JES te rone) contraceptive injections prevent pregnancy. They provide effective birth control for 3 months. Depo-subQ Provera 104 is also used for treating pain related to endometriosis. This medicine may be used for other purposes; ask your health care provider or pharmacist if you have questions. COMMON BRAND NAME(S): Depo-Provera, Depo-subQ Provera 104 What should I tell my health care provider before I take this medicine? They need to know if you have any of these conditions:  frequently drink alcohol  asthma  blood vessel disease or a history of a blood clot in the lungs or legs  bone disease such as osteoporosis  breast cancer  diabetes  eating disorder (anorexia nervosa or bulimia)  high blood pressure  HIV infection or AIDS  kidney disease  liver disease  mental depression  migraine  seizures (convulsions)  stroke  tobacco smoker  vaginal bleeding  an unusual or allergic reaction to medroxyprogesterone, other hormones, medicines, foods, dyes, or preservatives  pregnant or trying to get pregnant  breast-feeding How should I use this medicine? Depo-Provera Contraceptive injection is given into a muscle. Depo-subQ Provera 104 injection is given under the skin. These injections are given by a health care professional. You must not be pregnant before getting an injection. The injection is usually given during the first  5 days after the start of a menstrual period or 6 weeks after delivery of a baby. Talk to your pediatrician regarding the use of this medicine in children. Special care may be needed. These injections have been used in female children who have started  having menstrual periods. Overdosage: If you think you have taken too much of this medicine contact a poison control center or emergency room at once. NOTE: This medicine is only for you. Do not share this medicine with others. What if I miss a dose? Try not to miss a dose. You must get an injection once every 3 months to maintain birth control. If you cannot keep an appointment, call and reschedule it. If you wait longer than 13 weeks between Depo-Provera contraceptive injections or longer than 14 weeks between Depo-subQ Provera 104 injections, you could get pregnant. Use another method for birth control if you miss your appointment. You may also need a pregnancy test before receiving another injection. What may interact with this medicine? Do not take this medicine with any of the following medications:  bosentan This medicine may also interact with the following medications:  aminoglutethimide  antibiotics or medicines for infections, especially rifampin, rifabutin, rifapentine, and griseofulvin  aprepitant  barbiturate medicines such as phenobarbital or primidone  bexarotene  carbamazepine  medicines for seizures like ethotoin, felbamate, oxcarbazepine, phenytoin, topiramate  modafinil  St. John's wort This list may not describe all possible interactions. Give your health care provider a list of all the medicines, herbs, non-prescription drugs, or dietary supplements you use. Also tell them if you smoke, drink alcohol, or use illegal drugs. Some items may interact with your medicine. What should I watch for while using this medicine? This drug does not protect you against HIV infection (AIDS) or other sexually transmitted diseases.  Use of this product may cause you to lose calcium from your bones. Loss of calcium may cause weak bones (osteoporosis). Only use this product for more than 2 years if other forms of birth control are not right for you. The longer you use this product for birth control the more likely you will be at risk for weak bones. Ask your health care professional how you can keep strong bones. You may have a change in bleeding pattern or irregular periods. Many females stop having periods while taking this drug. If you have received your injections on time, your chance of being pregnant is very low. If you think you may be pregnant, see your health care professional as soon as possible. Tell your health care professional if you want to get pregnant within the next year. The effect of this medicine may last a long time after you get your last injection. What side effects may I notice from receiving this medicine? Side effects that you should report to your doctor or health care professional as soon as possible:  allergic reactions like skin rash, itching or hives, swelling of the face, lips, or tongue  breast tenderness or discharge  breathing problems  changes in vision  depression  feeling faint or lightheaded, falls  fever  pain in the abdomen, chest, groin, or leg  problems with balance, talking, walking  unusually weak or tired  yellowing of the eyes or skin Side effects that usually do not require medical attention (report to your doctor or health care professional if they continue or are bothersome):  acne  fluid retention and swelling  headache  irregular periods, spotting, or absent periods  temporary pain, itching, or skin reaction at site where injected  weight gain This list may not describe all possible side effects. Call your doctor for medical advice about side effects. You may report side effects to FDA at 1-800-FDA-1088. Where should I keep my medicine? This does not  apply. The injection will be given to you by a health care professional. NOTE: This sheet is a summary. It may not cover all possible information. If you have questions about this medicine, talk to your doctor, pharmacist, or health care provider.  2020 Elsevier/Gold Standard (2008-09-21 18:37:56) Endometriosis  Endometriosis is a condition in which the tissue that lines the uterus (endometrium) grows outside of its normal location. The tissue may grow in many locations close to the uterus, but it commonly grows on the ovaries, fallopian tubes, vagina, or bowel. When the uterus sheds the endometrium every menstrual cycle, there is bleeding wherever the endometrial tissue is located. This can cause pain because blood is irritating to tissues that are not normally exposed to it. What are the causes? The cause of endometriosis is not known. What increases the risk? You may be more likely to develop endometriosis if you:  Have a family history of endometriosis.  Have never given birth.  Started your period at age 5 or younger.  Have high levels of estrogen in your body.  Were exposed to a certain medicine (diethylstilbestrol) before you were born (in utero).  Had low birth weight.  Were born as a twin, triplet, or other multiple.  Have a BMI of less than 25. BMI is an estimate of body fat and is calculated from height and weight. What are the signs or symptoms? Often, there are no symptoms of this condition. If you do have symptoms, they may:  Vary depending on where your endometrial tissue is growing.  Occur during your menstrual period (most common) or midcycle.  Come and go, or you may go months with no symptoms at all.  Stop with menopause. Symptoms may include:  Pain in the back or abdomen.  Heavier bleeding during periods.  Pain during sex.  Painful bowel movements.  Infertility.  Pelvic pain.  Bleeding more than once a month. How is this diagnosed? This  condition is diagnosed based on your symptoms and a physical exam. You may have tests, such as:  Blood tests and urine tests. These may be done to help rule out other possible causes of your symptoms.  Ultrasound, to look for abnormal tissues.  An X-ray of the lower bowel (barium enema).  An ultrasound that is done through the vagina (transvaginally).  CT scan.  MRI.  Laparoscopy. In this procedure, a lighted, pencil-sized instrument called a laparoscope is inserted into your abdomen through an incision. The laparoscope allows your health care provider to look at the organs inside your body and check for abnormal tissue to confirm the diagnosis. If abnormal tissue is found, your health care provider may remove a small piece of tissue (biopsy) to be examined under a microscope. How is this treated? Treatment for this condition may include:  Medicines to relieve pain, such as NSAIDs.  Hormone therapy. This involves using artificial (synthetic) hormones to reduce endometrial tissue growth. Your health care provider may recommend using a hormonal form of birth control, or other medicines.  Surgery. This may be done to remove abnormal endometrial tissue. ? In some cases, tissue may be removed using a laparoscope and a laser (laparoscopic laser treatment). ? In severe cases, surgery may be done to remove the fallopian tubes, uterus, and ovaries (hysterectomy). Follow these instructions at home:  Take over-the-counter and prescription medicines only as told by your health care provider.  Do not drive or use heavy machinery while taking prescription pain medicine.  Try to avoid activities that cause  pain, including sexual activity.  Keep all follow-up visits as told by your health care provider. This is important. Contact a health care provider if:  You have pain in the area between your hip bones (pelvic area) that occurs: ? Before, during, or after your period. ? In between your  period and gets worse during your period. ? During or after sex. ? With bowel movements or urination, especially during your period.  You have problems getting pregnant.  You have a fever. Get help right away if:  You have severe pain that does not get better with medicine.  You have severe nausea and vomiting, or you cannot eat without vomiting.  You have pain that affects only the lower, right side of your abdomen.  You have abdominal pain that gets worse.  You have abdominal swelling.  You have blood in your stool. This information is not intended to replace advice given to you by your health care provider. Make sure you discuss any questions you have with your health care provider. Document Released: 08/28/2000 Document Revised: 08/13/2017 Document Reviewed: 02/01/2016 Elsevier Patient Education  2020 [employer].

## 2019-06-08 NOTE — Progress Notes (Signed)
GYNECOLOGY  VISIT   HPI: 48 y.o.   Single  African American  female   [id number] with Patient's last menstrual period was 06/07/2019.   here for   Pelvic Ultrasound  Patient has pelvic pain and pressure and heavy menses.  She changes her tampon and pad every 2.5 - 3 hours. She had an Essure and is concerned about the location of the Essure following an x-ray for her back pain.   She has back pain all the time and states it is hard for her to walk and stand.   She has fluid collection in her legs and she uses HCTZ.   GYNECOLOGIC HISTORY: Patient's last menstrual period was 06/07/2019. Contraception:  Essure Menopausal hormone therapy:  none Last mammogram:  08-02-15 density B/BIRADS 1 negative  Last pap smear:   07/25/18 Neg:Neg HR HPV - PCP.  Always had normal paps.                OB History    Gravida  3   Para  3   Term  0   Preterm  0   AB  0   Living  3     SAB  0   TAB  0   Ectopic  0   Multiple  0   Live Births  0              Patient Active Problem List   Diagnosis Date Noted  . Low back pain with radiation 04/28/2019  . Stress reaction 05/16/2017  . Essential hypertension 06/18/2015  . Hyperglycemia 06/18/2015  . Hemorrhoids 01/31/2012  . Fatigue 12/31/2011  . Abdominal pain, epigastric 12/31/2011  . B12 deficiency 01/13/2011  . Anxiety and depression 12/26/2010  . Migraines 12/15/2010  . Stress 12/15/2010  . OBESITY, MORBID 04/04/2007    Past Medical History:  Diagnosis Date  . Anemia   . Anxiety   . Asthma   . Depression   . GERD (gastroesophageal reflux disease)   . History of anal fissures   . History of gallstones   . Hypertension   . Migraine headache with aura   . Migraines   . Obesity     Past Surgical History:  Procedure Laterality Date  . CHOLECYSTECTOMY    . FLEXIBLE SIGMOIDOSCOPY  03/09/2012   Procedure: FLEXIBLE SIGMOIDOSCOPY;  Surgeon: Louis Meckelobert D Kaplan, MD;  Location: [facility];  Service: Endoscopy;   Laterality: N/A;  . TUBAL LIGATION     Essure    Current Outpatient Medications  Medication Sig Dispense Refill  . cyclobenzaprine (FLEXERIL) 10 MG tablet Take 1 tablet (10 mg total) by mouth 3 (three) times daily as needed for muscle spasms. 30 tablet 0  . fluticasone (FLONASE) 50 MCG/ACT nasal spray SPRAY 2 SPRAYS INTO EACH NOSTRIL EVERY DAY    . hydrochlorothiazide (HYDRODIURIL) 25 MG tablet TAKE 1 TABLET (25 MG TOTAL) BY MOUTH DAILY AS NEEDED 90 tablet 3  . hydrocortisone (ANUSOL-HC) 25 MG suppository Place 1 suppository (25 mg total) rectally 2 (two) times daily. 24 suppository 0  . LORazepam (ATIVAN) 0.5 MG tablet Take 1 tablet (0.5 mg total) by mouth 2 (two) times daily as needed for anxiety. 30 tablet 1  . metoprolol tartrate (LOPRESSOR) 50 MG tablet Take 1 tablet (50 mg total) by mouth 2 (two) times daily. 180 tablet 1  . Multiple Vitamin (MULTIVITAMIN) tablet Take 1 tablet by mouth daily.    . VENTOLIN HFA 108 (90 Base) MCG/ACT inhaler TAKE 2 PUFFS BY  MOUTH EVERY 6 HOURS AS NEEDED FOR WHEEZE OR SHORTNESS OF BREATH 18 Inhaler 2  . fluconazole (DIFLUCAN) 150 MG tablet 1 po x1, may repeat in 3 days prn (Patient not taking: Reported on 04/28/2019) 2 tablet 0   No current facility-administered medications for this visit.      ALLERGIES: Patient has no known allergies.  Family History  Problem Relation Age of Onset  . Breast cancer Mother   . Hypertension Mother   . Hepatitis Father 51       Hep C  . Diabetes Father   . Arthritis Father   . Hypertension Father   . Kidney disease Father   . Heart attack Father 15  . Coronary artery disease Maternal Grandmother   . Cataracts Maternal Grandmother   . Glaucoma Maternal Grandmother   . Heart disease Maternal Grandmother   . Obesity Other   . Obesity Paternal Grandmother     Social History   Socioeconomic History  . Marital status: Single    Spouse name: Not on file  . Number of children: 3  . Years of education: Not on  file  . Highest education level: Not on file  Occupational History  . Occupation: disability    Employer: [employer]  Social Needs  . Financial resource strain: Not on file  . Food insecurity    Worry: Not on file    Inability: Not on file  . Transportation needs    Medical: Not on file    Non-medical: Not on file  Tobacco Use  . Smoking status: Never Smoker  . Smokeless tobacco: Never Used  Substance and Sexual Activity  . Alcohol use: No  . Drug use: No  . Sexual activity: Not Currently    Partners: Male    Comment: Essure  Lifestyle  . Physical activity    Days per week: Not on file    Minutes per session: Not on file  . Stress: Not on file  Relationships  . Social Musician on phone: Not on file    Gets together: Not on file    Attends religious service: Not on file    Active member of club or organization: Not on file    Attends meetings of clubs or organizations: Not on file    Relationship status: Not on file  . Intimate partner violence    Fear of current or ex partner: Not on file    Emotionally abused: Not on file    Physically abused: Not on file    Forced sexual activity: Not on file  Other Topics Concern  . Not on file  Social History Narrative   Exercise-- trying to do some steps    Review of Systems  Constitutional: Negative.   HENT: Negative.   Eyes: Negative.   Respiratory: Negative.   Cardiovascular: Negative.   Gastrointestinal: Negative.   Endocrine: Negative.   Genitourinary: Negative.   Musculoskeletal: Negative.   Skin: Negative.   Allergic/Immunologic: Negative.   Neurological: Positive for numbness.       In hands and feet  Hematological: Negative.   Psychiatric/Behavioral: Negative.     PHYSICAL EXAMINATION:    BP (!) 144/90 (BP Location: Right Arm, Patient Position: Sitting, Cuff Size: Large)   Pulse 86   Temp (!) 97.3 F (36.3 C) (Skin)   Resp 18   Ht 5\' 7"  (1.702 m)   Wt (!) 300 lb 4 oz (136.2  kg)   LMP 06/07/2019  BMI 47.03 kg/m     General appearance: alert, cooperative and appears stated age  Pelvic US  Uterus with no myometrial masses.  Changes consistent with adenomyosis.  Essure noted bilaterally.  EMS 6.59 mm. Ovaries normal.  No free fluid.  ASSESSMENT  Pelvic pain, dysmenorrhea, and low back pain.  Heavy menstrual cycles. Possible adenomyosis.  Status post Essure.  Migraine HA with aura.   HTN.  Back pain.   PLAN  Findings of potential adenomyosis discussed.  I told her I do not think her back pain or painful menses are due to Essure.  We discussed Micronor, Depo Provera, Nexplanon, Mirena, and laparoscopic hysterectomy.  Risks and benefits reviewed.  Written information given to patient.  She wants to start Micronor.  Rx for 3 months after she updates her mammogram. She will then need follow up to determine if Micronor is meeting our treatment goals. She will follow up with her PCP regarding her back pain.    An After Visit Summary was printed and given to the patient.  __25____ minutes face to face time of which over 50% was spent in counseling.

## 2019-06-14 ENCOUNTER — Other Ambulatory Visit: Payer: Self-pay | Admitting: Family Medicine

## 2019-06-14 DIAGNOSIS — N760 Acute vaginitis: Secondary | ICD-10-CM

## 2019-06-20 ENCOUNTER — Telehealth: Payer: Self-pay

## 2019-06-20 NOTE — Telephone Encounter (Signed)
Copied from [place] [phone]. Topic: General - Other >> Jun 20, 2019  8:50 AM Celene Kras A wrote: Reason for CRM: PT called and is requesting to speak with PCPs nurse regarding some tests she had done. Please advise.

## 2019-06-20 NOTE — Telephone Encounter (Signed)
Spoke with patient. Pt states making a virtual visit with [place] to discuss concerns.

## 2019-06-21 ENCOUNTER — Encounter: Payer: Self-pay | Admitting: Family Medicine

## 2019-06-21 ENCOUNTER — Other Ambulatory Visit: Payer: Self-pay

## 2019-06-21 ENCOUNTER — Ambulatory Visit (INDEPENDENT_AMBULATORY_CARE_PROVIDER_SITE_OTHER): Payer: BC Managed Care – PPO | Admitting: Family Medicine

## 2019-06-21 DIAGNOSIS — M545 Low back pain, unspecified: Secondary | ICD-10-CM

## 2019-06-21 MED ORDER — TRAMADOL HCL 50 MG PO TABS: 50.0000 mg | ORAL_TABLET | Freq: Three times a day (TID) | ORAL | 0 refills | Status: DC | PRN

## 2019-06-21 MED ORDER — CYCLOBENZAPRINE HCL 10 MG PO TABS: 10.0000 mg | ORAL_TABLET | Freq: Three times a day (TID) | ORAL | 0 refills | Status: DC | PRN

## 2019-06-21 NOTE — Progress Notes (Signed)
Virtual Visit via Video Note  I connected with Jody [name] on 06/21/19 at 11:20 AM EDT by a video enabled telemedicine application and verified that I am speaking with the correct person using two identifiers.  Location: Patient: home  Provider: home    I discussed the limitations of evaluation and management by telemedicine and the availability of in person appointments. The patient expressed understanding and agreed to proceed.  History of Present Illness: Pt is home c/o worsening back pain that is now radiating down her R leg Muscle relaxer and otc pain relievers not helping Reviewed xray and previous ov   tingling / pain radiates down R leg to foot / toes  Observations/Objective: Vitals:   06/21/19 1055  Temp: 97.7 F (36.5 C)    No other vitals obtained  Pt in nad She is in pain with walking  Pt has foot drop on R with heel walking  Assessment and Plan:  1. Low back pain with radiation con't ice and heat  Refill flexeril  Add ultram Call with and questions or concerns  - traMADol (ULTRAM) 50 MG tablet; Take 1 tablet (50 mg total) by mouth every 8 (eight) hours as needed for up to 5 days.  Dispense: 15 tablet; Refill: 0 - cyclobenzaprine (FLEXERIL) 10 MG tablet; Take 1 tablet (10 mg total) by mouth 3 (three) times daily as needed for muscle spasms.  Dispense: 30 tablet; Refill: 0 - MR Lumbar Spine Wo Contrast; Future  Follow Up Instructions:    I discussed the assessment and treatment plan with the patient. The patient was provided an opportunity to ask questions and all were answered. The patient agreed with the plan and demonstrated an understanding of the instructions.   The patient was advised to call back or seek an in-person evaluation if the symptoms worsen or if the condition fails to improve as anticipated.  I provided 15 minutes of non-face-to-face time during this encounter.   Ann Held, DO

## 2019-07-13 ENCOUNTER — Encounter: Payer: Self-pay | Admitting: Family Medicine

## 2019-07-14 ENCOUNTER — Other Ambulatory Visit: Payer: Self-pay | Admitting: Family Medicine

## 2019-07-14 ENCOUNTER — Other Ambulatory Visit: Payer: Self-pay

## 2019-07-14 ENCOUNTER — Ambulatory Visit
Admission: RE | Admit: 2019-07-14 | Discharge: 2019-07-14 | Disposition: A | Discharge status: Home / Self Care | Payer: BC Managed Care – PPO | Source: Ambulatory Visit | Attending: Family Medicine | Admitting: Family Medicine

## 2019-07-14 ENCOUNTER — Other Ambulatory Visit: Payer: BC Managed Care – PPO

## 2019-07-14 DIAGNOSIS — M48061 Spinal stenosis, lumbar region without neurogenic claudication: Secondary | ICD-10-CM | POA: Diagnosis not present

## 2019-07-14 DIAGNOSIS — M545 Low back pain, unspecified: Secondary | ICD-10-CM

## 2019-07-16 ENCOUNTER — Other Ambulatory Visit: Payer: Self-pay | Admitting: Family Medicine

## 2019-07-19 ENCOUNTER — Other Ambulatory Visit: Payer: Self-pay

## 2019-07-19 DIAGNOSIS — M545 Low back pain, unspecified: Secondary | ICD-10-CM

## 2019-07-21 ENCOUNTER — Encounter (HOSPITAL_BASED_OUTPATIENT_CLINIC_OR_DEPARTMENT_OTHER): Payer: Self-pay

## 2019-07-21 ENCOUNTER — Other Ambulatory Visit: Payer: Self-pay

## 2019-07-21 ENCOUNTER — Ambulatory Visit (HOSPITAL_BASED_OUTPATIENT_CLINIC_OR_DEPARTMENT_OTHER)
Admission: RE | Admit: 2019-07-21 | Discharge: 2019-07-21 | Disposition: A | Discharge status: Home / Self Care | Payer: BC Managed Care – PPO | Source: Ambulatory Visit | Attending: Family Medicine | Admitting: Family Medicine

## 2019-07-21 DIAGNOSIS — M545 Low back pain, unspecified: Secondary | ICD-10-CM

## 2019-07-24 ENCOUNTER — Telehealth: Payer: Self-pay

## 2019-07-24 NOTE — Telephone Encounter (Signed)
Copied from [place] [phone]. Topic: General - Other >> Jul 21, 2019  5:13 PM Mcneil, Ja-Kwan wrote: Reason for CRM: Imaging dept called in regards to the order for x-ray stating pt denies any neck pain and only complains of pain in her back so the order would have to be corrected.

## 2019-07-25 ENCOUNTER — Ambulatory Visit (HOSPITAL_BASED_OUTPATIENT_CLINIC_OR_DEPARTMENT_OTHER)
Admission: RE | Admit: 2019-07-25 | Discharge: 2019-07-25 | Disposition: A | Discharge status: Home / Self Care | Payer: BC Managed Care – PPO | Source: Ambulatory Visit | Attending: Family Medicine | Admitting: Family Medicine

## 2019-07-25 ENCOUNTER — Other Ambulatory Visit: Payer: Self-pay

## 2019-07-25 DIAGNOSIS — M47816 Spondylosis without myelopathy or radiculopathy, lumbar region: Secondary | ICD-10-CM | POA: Diagnosis not present

## 2019-07-25 DIAGNOSIS — M545 Low back pain, unspecified: Secondary | ICD-10-CM

## 2019-07-26 DIAGNOSIS — M4310 Spondylolisthesis, site unspecified: Secondary | ICD-10-CM | POA: Diagnosis not present

## 2019-07-26 DIAGNOSIS — Z6841 Body Mass Index (BMI) 40.0 and over, adult: Secondary | ICD-10-CM | POA: Diagnosis not present

## 2019-07-26 DIAGNOSIS — I10 Essential (primary) hypertension: Secondary | ICD-10-CM | POA: Diagnosis not present

## 2019-07-26 NOTE — Telephone Encounter (Signed)
Pt had imaging done on 08/04/2019

## 2019-07-31 DIAGNOSIS — M4310 Spondylolisthesis, site unspecified: Secondary | ICD-10-CM | POA: Diagnosis not present

## 2019-07-31 DIAGNOSIS — M48062 Spinal stenosis, lumbar region with neurogenic claudication: Secondary | ICD-10-CM | POA: Diagnosis not present

## 2019-07-31 DIAGNOSIS — M4316 Spondylolisthesis, lumbar region: Secondary | ICD-10-CM | POA: Diagnosis not present

## 2019-08-09 DIAGNOSIS — M48062 Spinal stenosis, lumbar region with neurogenic claudication: Secondary | ICD-10-CM | POA: Diagnosis not present

## 2019-08-14 ENCOUNTER — Ambulatory Visit
Admission: RE | Admit: 2019-08-14 | Discharge: 2019-08-14 | Disposition: A | Discharge status: Home / Self Care | Payer: BC Managed Care – PPO | Source: Ambulatory Visit | Attending: Family Medicine | Admitting: Family Medicine

## 2019-08-14 ENCOUNTER — Other Ambulatory Visit: Payer: Self-pay

## 2019-08-14 DIAGNOSIS — Z1239 Encounter for other screening for malignant neoplasm of breast: Secondary | ICD-10-CM

## 2019-08-14 DIAGNOSIS — Z1231 Encounter for screening mammogram for malignant neoplasm of breast: Secondary | ICD-10-CM | POA: Diagnosis not present

## 2019-08-21 ENCOUNTER — Ambulatory Visit (INDEPENDENT_AMBULATORY_CARE_PROVIDER_SITE_OTHER): Payer: BC Managed Care – PPO | Admitting: Family Medicine

## 2019-08-21 ENCOUNTER — Encounter: Payer: Self-pay | Admitting: Family Medicine

## 2019-08-21 ENCOUNTER — Other Ambulatory Visit: Payer: Self-pay

## 2019-08-21 VITALS — BP 130/80 | HR 100 | Temp 97.8°F | Resp 18 | Ht 67.0 in | Wt 296.6 lb

## 2019-08-21 DIAGNOSIS — M545 Low back pain, unspecified: Secondary | ICD-10-CM

## 2019-08-21 DIAGNOSIS — Z0000 Encounter for general adult medical examination without abnormal findings: Secondary | ICD-10-CM | POA: Diagnosis not present

## 2019-08-21 DIAGNOSIS — N809 Endometriosis, unspecified: Secondary | ICD-10-CM | POA: Diagnosis not present

## 2019-08-21 MED ORDER — CYCLOBENZAPRINE HCL 10 MG PO TABS: 10.0000 mg | ORAL_TABLET | Freq: Three times a day (TID) | ORAL | 0 refills | Status: DC | PRN

## 2019-08-21 MED ORDER — TRAMADOL HCL 50 MG PO TABS: 50.0000 mg | ORAL_TABLET | Freq: Three times a day (TID) | ORAL | 0 refills | Status: AC | PRN

## 2019-08-21 NOTE — Patient Instructions (Signed)

## 2019-08-21 NOTE — Progress Notes (Signed)
Subjective:     Jody [name] is a 48 y.o. female and is here for a comprehensive physical exam. The patient reports no problems.  Social History   Socioeconomic History  . Marital status: Single    Spouse name: Not on file  . Number of children: 3  . Years of education: Not on file  . Highest education level: Not on file  Occupational History  . Occupation: disability    Employer: [employer]  Social Needs  . Financial resource strain: Not on file  . Food insecurity    Worry: Not on file    Inability: Not on file  . Transportation needs    Medical: Not on file    Non-medical: Not on file  Tobacco Use  . Smoking status: Never Smoker  . Smokeless tobacco: Never Used  Substance and Sexual Activity  . Alcohol use: No  . Drug use: No  . Sexual activity: Not Currently    Partners: Male    Comment: Essure  Lifestyle  . Physical activity    Days per week: Not on file    Minutes per session: Not on file  . Stress: Not on file  Relationships  . Social Musician on phone: Not on file    Gets together: Not on file    Attends religious service: Not on file    Active member of club or organization: Not on file    Attends meetings of clubs or organizations: Not on file    Relationship status: Not on file  . Intimate partner violence    Fear of current or ex partner: Not on file    Emotionally abused: Not on file    Physically abused: Not on file    Forced sexual activity: Not on file  Other Topics Concern  . Not on file  Social History Narrative   Exercise-- trying to do some steps   Health Maintenance  Topic Date Due  . INFLUENZA VACCINE  12/13/2019 (Originally 04/15/2019)  . MAMMOGRAM  08/13/2020  . PAP SMEAR-Modifier  07/25/2021  . TETANUS/TDAP  07/25/2028  . HIV Screening  Completed    The following portions of the patient's history were reviewed and updated as appropriate: She  has a past medical history of Anemia, Anxiety, Asthma,  Depression, GERD (gastroesophageal reflux disease), History of anal fissures, History of gallstones, Hypertension, Migraine headache with aura, Migraines, and Obesity. She does not have any pertinent problems on file. She  has a past surgical history that includes Cholecystectomy; Tubal ligation; and Flexible sigmoidoscopy (03/09/2012). Her family history includes Arthritis in her father; Breast cancer in her mother; Cataracts in her maternal grandmother; Coronary artery disease in her maternal grandmother; Diabetes in her father; Glaucoma in her maternal grandmother; Heart attack (age of onset: 78) in her father; Heart disease in her maternal grandmother; Hepatitis (age of onset: 47) in her father; Hypertension in her father and mother; Kidney disease in her father; Obesity in her paternal grandmother and another family member. She  reports that she has never smoked. She has never used smokeless tobacco. She reports that she does not drink alcohol or use drugs. She has a current medication list which includes the following prescription(s): cyclobenzaprine, fluticasone, hydrochlorothiazide, hydrocortisone, lorazepam, metoprolol tartrate, multivitamin, and ventolin hfa. Current Outpatient Medications on File Prior to Visit  Medication Sig Dispense Refill  . cyclobenzaprine (FLEXERIL) 10 MG tablet Take 1 tablet (10 mg total) by mouth 3 (three) times daily as  needed for muscle spasms. 30 tablet 0  . fluticasone (FLONASE) 50 MCG/ACT nasal spray SPRAY 2 SPRAYS INTO EACH NOSTRIL EVERY DAY    . hydrochlorothiazide (HYDRODIURIL) 25 MG tablet TAKE 1 TABLET (25 MG TOTAL) BY MOUTH DAILY AS NEEDED 90 tablet 3  . hydrocortisone (ANUSOL-HC) 25 MG suppository Place 1 suppository (25 mg total) rectally 2 (two) times daily. 24 suppository 0  . LORazepam (ATIVAN) 0.5 MG tablet Take 1 tablet (0.5 mg total) by mouth 2 (two) times daily as needed for anxiety. 30 tablet 1  . metoprolol tartrate (LOPRESSOR) 50 MG tablet Take 1  tablet (50 mg total) by mouth 2 (two) times daily. 180 tablet 1  . Multiple Vitamin (MULTIVITAMIN) tablet Take 1 tablet by mouth daily.    . VENTOLIN HFA 108 (90 Base) MCG/ACT inhaler TAKE 2 PUFFS BY MOUTH EVERY 6 HOURS AS NEEDED FOR WHEEZE OR SHORTNESS OF BREATH 18 Inhaler 2   No current facility-administered medications on file prior to visit.    She has No Known Allergies..  Review of Systems Review of Systems  Constitutional: Negative for activity change, appetite change and fatigue.  HENT: Negative for hearing loss, congestion, tinnitus and ear discharge.  dentist [id number] Eyes: Negative for visual disturbance (see optho q1y -- vision corrected to 20/20 with glasses).  Respiratory: Negative for cough, chest tightness and shortness of breath.   Cardiovascular: Negative for chest pain, palpitations and leg swelling.  Gastrointestinal: Negative for abdominal pain, diarrhea, constipation and abdominal distention.  Genitourinary: Negative for urgency, frequency, decreased urine volume and difficulty urinating.  Musculoskeletal: Negative for arthralgias and gait problem.  + back pain Skin: Negative for color change, pallor and rash.  Neurological: Negative for dizziness, light-headedness, numbness and headaches.  Hematological: Negative for adenopathy. Does not bruise/bleed easily.  Psychiatric/Behavioral: Negative for suicidal ideas, confusion, sleep disturbance, self-injury, dysphoric mood, decreased concentration and agitation.       Objective:    BP 130/80 (BP Location: Right Arm, Patient Position: Sitting, Cuff Size: Normal)   Pulse 100   Temp 97.8 F (36.6 C) (Temporal)   Resp 18   Ht 5\' 7"  (1.702 m)   Wt 296 lb 9.6 oz (134.5 kg)   LMP 07/31/2019   SpO2 96%   BMI 46.45 kg/m  General appearance: alert, cooperative, appears stated age and no distress Head: Normocephalic, without obvious abnormality, atraumatic Eyes: conjunctivae/corneas clear. PERRL, EOM's intact. Fundi  benign. Ears: normal TM's and external ear canals both ears Neck: no adenopathy, no carotid bruit, no JVD, supple, symmetrical, trachea midline and thyroid not enlarged, symmetric, no tenderness/mass/nodules Back: symmetric, no curvature. ROM normal. No CVA tenderness. Lungs: clear to auscultation bilaterally Breasts: normal appearance, no masses or tenderness Heart: regular rate and rhythm, S1, S2 normal, no murmur, click, rub or gallop Abdomen: soft, non-tender; bowel sounds normal; no masses,  no organomegaly Pelvic: deferred Extremities: extremities normal, atraumatic, no cyanosis or edema Pulses: 2+ and symmetric Skin: Skin color, texture, turgor normal. No rashes or lesions Lymph nodes: Cervical, supraclavicular, and axillary nodes normal. Neurologic: Alert and oriented X 3, normal strength and tone. Normal symmetric reflexes. Normal coordination and gait    Assessment:    Healthy female exam.      Plan:     ghm utd Check labs After Visit Summary for Counseling Recommendations    1. Low back pain with radiation Stable con't meds F/u pain management  - cyclobenzaprine (FLEXERIL) 10 MG tablet; Take 1 tablet (10 mg total) by mouth 3 (  three) times daily as needed for muscle spasms.  Dispense: 30 tablet; Refill: 0 - traMADol (ULTRAM) 50 MG tablet; Take 1 tablet (50 mg total) by mouth every 8 (eight) hours as needed for up to 5 days.  Dispense: 15 tablet; Refill: 0  2. Preventative health care See above  - Lipid panel - CBC with Differential - TSH - Comprehensive metabolic panel  3. Endometriosis F/u ob/ gyn

## 2019-08-22 LAB — COMPREHENSIVE METABOLIC PANEL
ALT: 11 U/L (ref 0–35)
AST: 16 U/L (ref 0–37)
Albumin: 3.8 g/dL (ref 3.5–5.2)
Alkaline Phosphatase: 54 U/L (ref 39–117)
BUN: 9 mg/dL (ref 6–23)
CO2: 29 mEq/L (ref 19–32)
Calcium: 9 mg/dL (ref 8.4–10.5)
Chloride: 97 mEq/L (ref 96–112)
Creatinine, Ser: 0.77 mg/dL (ref 0.40–1.20)
GFR: 97.47 mL/min (ref >60.00)
Glucose, Bld: 90 mg/dL (ref 70–99)
Potassium: 3.4 mEq/L — ABNORMAL LOW (ref 3.5–5.1)
Sodium: 134 mEq/L — ABNORMAL LOW (ref 135–145)
Total Bilirubin: 0.6 mg/dL (ref 0.2–1.2)
Total Protein: 7.2 g/dL (ref 6.0–8.3)

## 2019-08-22 LAB — LIPID PANEL
Cholesterol: 140 mg/dL (ref 0–200)
HDL: 44.6 mg/dL (ref >39.00)
LDL Cholesterol: 84 mg/dL (ref 0–99)
NonHDL: 94.99
Total CHOL/HDL Ratio: 3
Triglycerides: 55 mg/dL (ref 0.0–149.0)
VLDL: 11 mg/dL (ref 0.0–40.0)

## 2019-08-22 LAB — CBC WITH DIFFERENTIAL/PLATELET
Basophils Absolute: 0.1 10*3/uL (ref 0.0–0.1)
Basophils Relative: 1.2 % (ref 0.0–3.0)
Eosinophils Absolute: 0.1 10*3/uL (ref 0.0–0.7)
Eosinophils Relative: 1 % (ref 0.0–5.0)
HCT: 39.4 % (ref 36.0–46.0)
Hemoglobin: 12.6 g/dL (ref 12.0–15.0)
Lymphocytes Relative: 40.3 % (ref 12.0–46.0)
Lymphs Abs: 3.8 10*3/uL (ref 0.7–4.0)
MCHC: 32 g/dL (ref 30.0–36.0)
MCV: 83 fl (ref 78.0–100.0)
Monocytes Absolute: 0.6 10*3/uL (ref 0.1–1.0)
Monocytes Relative: 6.6 % (ref 3.0–12.0)
Neutro Abs: 4.8 10*3/uL (ref 1.4–7.7)
Neutrophils Relative %: 50.9 % (ref 43.0–77.0)
Platelets: 251 10*3/uL (ref 150.0–400.0)
RBC: 4.75 Mil/uL (ref 3.87–5.11)
RDW: 16.1 % — ABNORMAL HIGH (ref 11.5–15.5)
WBC: 9.3 10*3/uL (ref 4.0–10.5)

## 2019-08-22 LAB — TSH: TSH: 0.79 u[IU]/mL (ref 0.35–4.50)

## 2020-03-05 ENCOUNTER — Other Ambulatory Visit: Payer: Self-pay | Admitting: Family Medicine

## 2020-03-05 DIAGNOSIS — J40 Bronchitis, not specified as acute or chronic: Secondary | ICD-10-CM

## 2020-03-09 ENCOUNTER — Other Ambulatory Visit: Payer: Self-pay | Admitting: Family Medicine

## 2020-03-09 DIAGNOSIS — F430 Acute stress reaction: Secondary | ICD-10-CM

## 2020-03-11 NOTE — Telephone Encounter (Signed)
Lorazepam refill.   Last OV: 08/21/2019 Last Fill: 07/25/2018 #30 and 1RF Pt sig: 1 tab bid prn UDS: 07/25/2018 Low risk

## 2020-03-26 DIAGNOSIS — Z0189 Encounter for other specified special examinations: Secondary | ICD-10-CM | POA: Diagnosis not present

## 2020-03-28 DIAGNOSIS — Z713 Dietary counseling and surveillance: Secondary | ICD-10-CM | POA: Diagnosis not present

## 2020-03-28 DIAGNOSIS — I10 Essential (primary) hypertension: Secondary | ICD-10-CM | POA: Diagnosis not present

## 2020-03-28 DIAGNOSIS — Z043 Encounter for examination and observation following other accident: Secondary | ICD-10-CM | POA: Diagnosis not present

## 2020-04-02 DIAGNOSIS — M48062 Spinal stenosis, lumbar region with neurogenic claudication: Secondary | ICD-10-CM | POA: Diagnosis not present

## 2020-05-02 DIAGNOSIS — R05 Cough: Secondary | ICD-10-CM | POA: Diagnosis not present

## 2020-05-02 DIAGNOSIS — Z1152 Encounter for screening for COVID-19: Secondary | ICD-10-CM | POA: Diagnosis not present

## 2020-05-02 DIAGNOSIS — U071 COVID-19: Secondary | ICD-10-CM | POA: Diagnosis not present

## 2020-05-10 ENCOUNTER — Emergency Department (HOSPITAL_COMMUNITY)
Admission: EM | Admit: 2020-05-10 | Discharge: 2020-05-10 | Disposition: A | Discharge status: Home / Self Care | Payer: BC Managed Care – PPO | Attending: Emergency Medicine | Admitting: Emergency Medicine

## 2020-05-10 ENCOUNTER — Emergency Department (HOSPITAL_COMMUNITY): Payer: BC Managed Care – PPO

## 2020-05-10 ENCOUNTER — Other Ambulatory Visit: Payer: Self-pay

## 2020-05-10 ENCOUNTER — Encounter (HOSPITAL_COMMUNITY): Payer: Self-pay | Admitting: *Deleted

## 2020-05-10 DIAGNOSIS — Z79899 Other long term (current) drug therapy: Secondary | ICD-10-CM | POA: Insufficient documentation

## 2020-05-10 DIAGNOSIS — U071 COVID-19: Secondary | ICD-10-CM | POA: Insufficient documentation

## 2020-05-10 DIAGNOSIS — I10 Essential (primary) hypertension: Secondary | ICD-10-CM | POA: Diagnosis not present

## 2020-05-10 DIAGNOSIS — J189 Pneumonia, unspecified organism: Secondary | ICD-10-CM | POA: Diagnosis not present

## 2020-05-10 DIAGNOSIS — R000 Tachycardia, unspecified: Secondary | ICD-10-CM | POA: Diagnosis not present

## 2020-05-10 DIAGNOSIS — Z7951 Long term (current) use of inhaled steroids: Secondary | ICD-10-CM | POA: Insufficient documentation

## 2020-05-10 DIAGNOSIS — R21 Rash and other nonspecific skin eruption: Secondary | ICD-10-CM | POA: Diagnosis not present

## 2020-05-10 DIAGNOSIS — R079 Chest pain, unspecified: Secondary | ICD-10-CM | POA: Diagnosis not present

## 2020-05-10 DIAGNOSIS — J45909 Unspecified asthma, uncomplicated: Secondary | ICD-10-CM | POA: Diagnosis not present

## 2020-05-10 LAB — BASIC METABOLIC PANEL
Anion gap: 14 (ref 5–15)
BUN: 6 mg/dL (ref 6–20)
CO2: 23 mmol/L (ref 22–32)
Calcium: 8.4 mg/dL — ABNORMAL LOW (ref 8.9–10.3)
Chloride: 104 mmol/L (ref 98–111)
Creatinine, Ser: 0.76 mg/dL (ref 0.44–1.00)
GFR calc Af Amer: >60 mL/min (ref >60)
GFR calc non Af Amer: >60 mL/min (ref >60)
Glucose, Bld: 122 mg/dL — ABNORMAL HIGH (ref 70–99)
Potassium: 3.3 mmol/L — ABNORMAL LOW (ref 3.5–5.1)
Sodium: 141 mmol/L (ref 135–145)

## 2020-05-10 LAB — CBC
HCT: 38.8 % (ref 36.0–46.0)
Hemoglobin: 12.5 g/dL (ref 12.0–15.0)
MCH: 26.9 pg (ref 26.0–34.0)
MCHC: 32.2 g/dL (ref 30.0–36.0)
MCV: 83.6 fL (ref 80.0–100.0)
Platelets: 160 10*3/uL (ref 150–400)
RBC: 4.64 MIL/uL (ref 3.87–5.11)
RDW: 16.7 % — ABNORMAL HIGH (ref 11.5–15.5)
WBC: 4.7 10*3/uL (ref 4.0–10.5)
nRBC: 0 % (ref 0.0–0.2)

## 2020-05-10 LAB — I-STAT BETA HCG BLOOD, ED (MC, WL, AP ONLY): I-stat hCG, quantitative: <5 m[IU]/mL (ref <5)

## 2020-05-10 LAB — SARS CORONAVIRUS 2 BY RT PCR (HOSPITAL ORDER, PERFORMED IN ~~LOC~~ HOSPITAL LAB): SARS Coronavirus 2: POSITIVE — AB

## 2020-05-10 LAB — TROPONIN I (HIGH SENSITIVITY): Troponin I (High Sensitivity): 4 ng/L (ref <18)

## 2020-05-10 MED ORDER — SODIUM CHLORIDE 0.9 % IV SOLN
1200.0000 mg | Freq: Once | INTRAVENOUS | Status: AC
  Administered 2020-05-10: 1200 mg via INTRAVENOUS
  Filled 2020-05-10: qty 1200

## 2020-05-10 MED ORDER — METHYLPREDNISOLONE SODIUM SUCC 125 MG IJ SOLR: 125.0000 mg | Freq: Once | INTRAMUSCULAR | Status: DC | PRN

## 2020-05-10 MED ORDER — ALBUTEROL SULFATE HFA 108 (90 BASE) MCG/ACT IN AERS: 2.0000 | INHALATION_SPRAY | Freq: Once | RESPIRATORY_TRACT | Status: DC | PRN

## 2020-05-10 MED ORDER — FAMOTIDINE IN NACL 20-0.9 MG/50ML-% IV SOLN: 20.0000 mg | Freq: Once | INTRAVENOUS | Status: DC | PRN

## 2020-05-10 MED ORDER — EPINEPHRINE 0.3 MG/0.3ML IJ SOAJ: 0.3000 mg | Freq: Once | INTRAMUSCULAR | Status: DC | PRN

## 2020-05-10 MED ORDER — DIPHENHYDRAMINE HCL 50 MG/ML IJ SOLN: 50.0000 mg | Freq: Once | INTRAMUSCULAR | Status: DC | PRN

## 2020-05-10 MED ORDER — ACETAMINOPHEN 325 MG PO TABS
650.0000 mg | ORAL_TABLET | Freq: Once | ORAL | Status: AC
  Administered 2020-05-10: 650 mg via ORAL
  Filled 2020-05-10: qty 2

## 2020-05-10 MED ORDER — ACETAMINOPHEN 325 MG PO TABS: 650.0000 mg | ORAL_TABLET | Freq: Once | ORAL | Status: DC

## 2020-05-10 MED ORDER — SODIUM CHLORIDE 0.9 % IV SOLN: INTRAVENOUS | Status: DC | PRN

## 2020-05-10 NOTE — ED Provider Notes (Signed)
[facility]-EMERGENCY DEPT Provider Note   CSN: [id number] Arrival date & time: 05/10/20  0110     History Chief Complaint  Patient presents with  . Chest Pain    Jody [name] is a 49 y.o. female.  The history is provided by the patient.  Shortness of Breath Severity:  Moderate Onset quality:  Gradual Duration:  1 day Timing:  Intermittent Progression:  Worsening Chronicity:  New Relieved by:  Rest Worsened by:  Activity Associated symptoms: cough, fever and vomiting   Associated symptoms comment:  CP with cough   Patient with history of hypertension and obesity presents with symptoms of COVID-19  patient reports she was diagnosed with Covid about 7 days ago. She reports she had symptoms throughout the day prior to this.  She reports her son also has COVID-19 She reports fever, cough, vomiting diarrhea.  She reports chest pain with cough.  She reports over the past day she had increasing shortness of breath. She is a non-smoker.  She did not get vaccinated    Past Medical History:  Diagnosis Date  . Anemia   . Anxiety   . Asthma   . Depression   . GERD (gastroesophageal reflux disease)   . History of anal fissures   . History of gallstones   . Hypertension   . Migraine headache with aura   . Migraines   . Obesity     Patient Active Problem List   Diagnosis Date Noted  . Endometriosis 08/21/2019  . Low back pain with radiation 04/28/2019  . Stress reaction 05/16/2017  . Essential hypertension 06/18/2015  . Hyperglycemia 06/18/2015  . Hemorrhoids 01/31/2012  . Fatigue 12/31/2011  . Abdominal pain, epigastric 12/31/2011  . B12 deficiency 01/13/2011  . Anxiety and depression 12/26/2010  . Migraines 12/15/2010  . Stress 12/15/2010  . OBESITY, MORBID 04/04/2007    Past Surgical History:  Procedure Laterality Date  . CHOLECYSTECTOMY    . FLEXIBLE SIGMOIDOSCOPY  03/09/2012   Procedure: FLEXIBLE SIGMOIDOSCOPY;  Surgeon:  Louis Meckel, MD;  Location: [facility];  Service: Endoscopy;  Laterality: N/A;  . TUBAL LIGATION     Essure     OB History    Gravida  3   Para  3   Term  0   Preterm  0   AB  0   Living  3     SAB  0   TAB  0   Ectopic  0   Multiple  0   Live Births  0           Family History  Problem Relation Age of Onset  . Breast cancer Mother   . Hypertension Mother   . Hepatitis Father 51       Hep C  . Diabetes Father   . Arthritis Father   . Hypertension Father   . Kidney disease Father   . Heart attack Father 65  . Coronary artery disease Maternal Grandmother   . Cataracts Maternal Grandmother   . Glaucoma Maternal Grandmother   . Heart disease Maternal Grandmother   . Obesity Other   . Obesity Paternal Grandmother     Social History   Tobacco Use  . Smoking status: Never Smoker  . Smokeless tobacco: Never Used  Vaping Use  . Vaping Use: Never used  Substance Use Topics  . Alcohol use: No  . Drug use: No    Home Medications Prior to Admission medications   Medication  Sig Start Date End Date Taking? Authorizing Provider  cyclobenzaprine (FLEXERIL) 10 MG tablet Take 1 tablet (10 mg total) by mouth 3 (three) times daily as needed for muscle spasms. 08/21/19   Zola Button, Myrene Buddy R, DO  fluticasone (FLONASE) 50 MCG/ACT nasal spray SPRAY 2 SPRAYS INTO EACH NOSTRIL EVERY DAY 12/26/18   [provider]  hydrochlorothiazide (HYDRODIURIL) 25 MG tablet TAKE 1 TABLET (25 MG TOTAL) BY MOUTH DAILY AS NEEDED 05/29/19   Zola Button, Grayling Congress, DO  hydrocortisone (ANUSOL-HC) 25 MG suppository Place 1 suppository (25 mg total) rectally 2 (two) times daily. 04/28/19   Seabron Spates R, DO  LORazepam (ATIVAN) 0.5 MG tablet TAKE 1 TABLET (0.5 MG TOTAL) BY MOUTH 2 (TWO) TIMES DAILY AS NEEDED FOR ANXIETY. 03/11/20   Zola Button, Grayling Congress, DO  metoprolol tartrate (LOPRESSOR) 50 MG tablet Take 1 tablet (50 mg total) by mouth 2 (two) times daily. 07/25/18    Donato Schultz, DO  Multiple Vitamin (MULTIVITAMIN) tablet Take 1 tablet by mouth daily.    [provider]  VENTOLIN HFA 108 (90 Base) MCG/ACT inhaler TAKE 2 PUFFS BY MOUTH EVERY 6 HOURS AS NEEDED FOR WHEEZE OR SHORTNESS OF BREATH 03/05/20   Donato Schultz, DO    Allergies    Patient has no known allergies.  Review of Systems   Review of Systems  Constitutional: Positive for chills, fatigue and fever.  Respiratory: Positive for cough and shortness of breath.   Gastrointestinal: Positive for vomiting.  Musculoskeletal: Positive for myalgias.  All other systems reviewed and are negative.   Physical Exam Updated Vital Signs BP (!) 163/103 (BP Location: Left Arm)   Pulse (!) 119   Temp (!) 100.4 F (38 C) (Oral)   Resp (!) 26   LMP  (LMP Unknown)   SpO2 91%   Physical Exam CONSTITUTIONAL: Well developed/well nourished HEAD: Normocephalic/atraumatic EYES: EOMI/PERRL ENMT: Mask in place NECK: supple no meningeal signs SPINE/BACK:entire spine nontender CV: S1/S2 noted, no murmurs/rubs/gallops noted, tachycardic LUNGS: Tachypneic, crackles bilaterally ABDOMEN: soft, nontender, no rebound or guarding, bowel sounds noted throughout abdomen, obese GU:no cva tenderness NEURO: Pt is awake/alert/appropriate, moves all extremitiesx4.  No facial droop.   EXTREMITIES: pulses normal/equal, full ROM SKIN: warm, color normal PSYCH: no abnormalities of mood noted, alert and oriented to situation  ED Results / Procedures / Treatments   Labs (all labs ordered are listed, but only abnormal results are displayed) Labs Reviewed  BASIC METABOLIC PANEL - Abnormal; Notable for the following components:      Result Value   Potassium 3.3 (*)    Glucose, Bld 122 (*)    Calcium 8.4 (*)    All other components within normal limits  CBC - Abnormal; Notable for the following components:   RDW 16.7 (*)    All other components within normal limits  SARS CORONAVIRUS 2 BY RT  PCR (HOSPITAL ORDER, PERFORMED IN [facility])  I-STAT BETA HCG BLOOD, ED ([facility], [facility], [facility] ONLY)  TROPONIN I (HIGH SENSITIVITY)    EKG EKG Interpretation  Date/Time:  Friday May 10 2020 01:23:48 EDT Ventricular Rate:  110 PR Interval:    QRS Duration: 83 QT Interval:  324 QTC Calculation: 439 R Axis:   23 Text Interpretation: Sinus tachycardia Probable left atrial enlargement Nonspecific T abnormalities, inferior leads Baseline wander in lead(s) V1 V2 V3 V4 V5 V6 12 Lead; Mason-Likar Abnormal ekg Confirmed by Zadie Rhine ([id number]) on 05/10/2020 1:29:46 AM  Radiology DG Chest Portable 1 View  Result Date: 05/10/2020 CLINICAL DATA:  49 year old female with positive COVID-19. EXAM: PORTABLE CHEST 1 VIEW COMPARISON:  None. FINDINGS: Bilateral confluent hazy densities most consistent with multifocal pneumonia and in keeping with COVID-19. No large pleural effusion or pneumothorax. The cardiac silhouette is within limits. No acute osseous pathology. IMPRESSION: Multifocal pneumonia. Electronically Signed   By: Elgie Collard M.D.   On: 05/10/2020 01:49    Procedures Procedures   Medications Ordered in ED Medications  0.9 %  sodium chloride infusion (has no administration in time range)  diphenhydrAMINE (BENADRYL) injection 50 mg (has no administration in time range)  famotidine (PEPCID) IVPB 20 mg premix (has no administration in time range)  methylPREDNISolone sodium succinate (SOLU-MEDROL) 125 mg/2 mL injection 125 mg (has no administration in time range)  albuterol (VENTOLIN HFA) 108 (90 Base) MCG/ACT inhaler 2 puff (has no administration in time range)  EPINEPHrine (EPI-PEN) injection 0.3 mg (has no administration in time range)  acetaminophen (TYLENOL) tablet 650 mg (650 mg Oral Given 05/10/20 0319)  casirivimab-imdevimab (REGEN-COV) 1,200 mg in sodium chloride 0.9 % 110 mL IVPB (1,200 mg Intravenous New Bag/Given 05/10/20 0601)    ED Course  I have reviewed the  triage vital signs and the nursing notes.  Pertinent labs & imaging results that were available during my care of the patient were reviewed by me and considered in my medical decision making (see chart for details).    MDM Rules/Calculators/A&P                          4:31 AM Patient presents with COVID-19 symptoms for approximately 1 week.  She has no resting hypoxia at this time. She appears well at rest. Patient will be a good candidate for monoclonal antibody infusion due to history of obesity and hypertension. We discussed the risk and benefits of this medication, and patient agrees with plan. 7:07 AM Pt tolerating MAB infusion At signout to dr tegeler, pt likely a candidate for d/c after infusion   Berneta Lashanta Elbe was evaluated in Emergency Department on 05/10/2020 for the symptoms described in the history of present illness. She was evaluated in the context of the global COVID-19 pandemic, which necessitated consideration that the patient might be at risk for infection with the SARS-CoV-2 virus that causes COVID-19. Institutional protocols and algorithms that pertain to the evaluation of patients at risk for COVID-19 are in a state of rapid change based on information released by regulatory bodies including the CDC and federal and state organizations. These policies and algorithms were followed during the patient's care in the ED.  Final Clinical Impression(s) / ED Diagnoses Final diagnoses:  None    Rx / DC Orders ED Discharge Orders    None       Zadie Rhine, MD 05/10/20 [phone]

## 2020-05-10 NOTE — ED Notes (Signed)
Paper consent obtained and witnessed by this RN.

## 2020-05-10 NOTE — ED Notes (Signed)
Pt. Documented in error see above note in chart. 

## 2020-05-10 NOTE — ED Notes (Signed)
Pulse Ox while ambulating- pt assisted OOB to ambulate in/around room. Pulse measurement 92-98bpm; Ox measurement 94-98% RA. Wickline MD advised. [employer]

## 2020-05-10 NOTE — ED Notes (Signed)
Discharge paperwork reviewed with pt.  Pt with no questions or concerns at time of discharge. Pt provided with copy of mab fact sheet with discharge paperwork.  Wheeled to ED entrance at discharge.

## 2020-05-10 NOTE — ED Provider Notes (Signed)
Care assumed from Dr. Bebe Shaggy.  At time of transfer of care, anticipate discharge after medical antibody infusion.  Anticipate follow-up with PCP for outpatient Covid management.  Patient finished without reported complaints.  Patient will be discharged with understanding of return precautions.  Clinical Impression: 1. COVID-19     Disposition: Discharge  Condition: Good  I have discussed the results, Dx and Tx plan with the pt(& family if present). He/she/they expressed understanding and agree(s) with the plan. Discharge instructions discussed at great length. Strict return precautions discussed and pt &/or family have verbalized understanding of the instructions. No further questions at time of discharge.    New Prescriptions   No medications on file    Follow Up: [name] AT [facility] [address] [place] [address] [phone]    Donato Schultz, DO [address] [place] [address] [phone]     [facility]PT [address] [id number] mc [place] [address] [phone]          Nikolette Reindl, Canary Brim, MD 05/10/20 [phone]

## 2020-05-10 NOTE — Discharge Instructions (Signed)
Please follow-up with your primary doctor in the coronavirus clinic for further management.  Please rest and stay hydrated.  Please stay isolated.  If any symptoms change or worsen, please return to the nearest emergency department.

## 2020-05-10 NOTE — ED Triage Notes (Signed)
Pt states she tested positive for COVID on Thursday at [facility]. She has been having cough, shortness of breath, chest pain, abdominal pain with coughing, diarrhea and fevers. Pt with obvious shortness of breath walking from lobby to triage room.

## 2020-06-03 ENCOUNTER — Telehealth: Payer: Self-pay | Admitting: *Deleted

## 2020-06-03 NOTE — Telephone Encounter (Signed)
Left message on machine to call back to schedule virtual appointment. 

## 2020-06-03 NOTE — Telephone Encounter (Signed)
Would you like for me to make her an appointment or can we send something in for for cough?

## 2020-06-03 NOTE — Telephone Encounter (Signed)
Who Is Calling Patient / Member / Family / Caregiver Call Type Triage / Clinical Relationship To Patient Self Return Phone Number [phone] (Primary) Chief Complaint Cough Reason for Call Symptomatic / Request for Health Information Initial Comment Caller states about a month ago she had COVID and now she is starting to develop the coughing symptoms. She was told she might need something like a prescription or antibiotic or prednisone. Wants to speak to a nurse regarding her options of treatment. Translation No Nurse Assessment Nurse: Jetty Peeks, RN, Lillia Abed Date/Time (Eastern Time): 05/31/2020 9:43:18 PM Confirm and document reason for call. If symptomatic, describe symptoms. ---Caller states she was dx'd with covid a month ago and she never had a cough, but started having a cough today. No fever.   Disp. Time Lamount Cohen Time) Disposition Final User 05/31/2020 9:52:27 PM Call PCP when Office is Open Yes Weiss-Hilton, RN, Lillia Abed Disposition Overriden: Home Care Override Reason: Patient's symptoms need a higher level of care Caller Disagree/Comply Comply Caller Understands Yes PreDisposition InappropriateToAsk

## 2020-06-03 NOTE — Telephone Encounter (Signed)
She needs ov -- virtual if she thinks she may need abx

## 2020-07-26 ENCOUNTER — Other Ambulatory Visit: Payer: Self-pay | Admitting: Family Medicine

## 2020-07-26 DIAGNOSIS — M5416 Radiculopathy, lumbar region: Secondary | ICD-10-CM | POA: Diagnosis not present

## 2020-07-26 DIAGNOSIS — M48061 Spinal stenosis, lumbar region without neurogenic claudication: Secondary | ICD-10-CM | POA: Diagnosis not present

## 2020-07-26 DIAGNOSIS — I10 Essential (primary) hypertension: Secondary | ICD-10-CM

## 2020-07-26 DIAGNOSIS — Z6841 Body Mass Index (BMI) 40.0 and over, adult: Secondary | ICD-10-CM | POA: Diagnosis not present

## 2020-08-17 ENCOUNTER — Other Ambulatory Visit: Payer: Self-pay | Admitting: Family Medicine

## 2020-08-17 DIAGNOSIS — I10 Essential (primary) hypertension: Secondary | ICD-10-CM

## 2020-08-19 DIAGNOSIS — M5416 Radiculopathy, lumbar region: Secondary | ICD-10-CM | POA: Diagnosis not present

## 2020-08-19 DIAGNOSIS — M48061 Spinal stenosis, lumbar region without neurogenic claudication: Secondary | ICD-10-CM | POA: Diagnosis not present

## 2020-09-04 DIAGNOSIS — M5116 Intervertebral disc disorders with radiculopathy, lumbar region: Secondary | ICD-10-CM | POA: Diagnosis not present

## 2020-09-04 DIAGNOSIS — Z6841 Body Mass Index (BMI) 40.0 and over, adult: Secondary | ICD-10-CM | POA: Diagnosis not present

## 2020-09-13 ENCOUNTER — Other Ambulatory Visit: Payer: Self-pay | Admitting: Family Medicine

## 2020-09-13 DIAGNOSIS — I10 Essential (primary) hypertension: Secondary | ICD-10-CM

## 2020-09-25 DIAGNOSIS — M5416 Radiculopathy, lumbar region: Secondary | ICD-10-CM | POA: Diagnosis not present

## 2020-09-25 DIAGNOSIS — M5116 Intervertebral disc disorders with radiculopathy, lumbar region: Secondary | ICD-10-CM | POA: Diagnosis not present

## 2020-09-26 DIAGNOSIS — I10 Essential (primary) hypertension: Secondary | ICD-10-CM | POA: Diagnosis not present

## 2020-09-26 DIAGNOSIS — M5116 Intervertebral disc disorders with radiculopathy, lumbar region: Secondary | ICD-10-CM | POA: Diagnosis not present

## 2020-09-26 DIAGNOSIS — Z7689 Persons encountering health services in other specified circumstances: Secondary | ICD-10-CM | POA: Diagnosis not present

## 2020-10-08 ENCOUNTER — Other Ambulatory Visit: Payer: Self-pay | Admitting: Family Medicine

## 2020-10-08 DIAGNOSIS — I10 Essential (primary) hypertension: Secondary | ICD-10-CM

## 2020-10-08 NOTE — Telephone Encounter (Signed)
Okay to decline medication?  Pt has no pending appointments and has not been seen since 2020.

## 2020-10-08 NOTE — Telephone Encounter (Signed)
Ok to send in 1 month Pt needs ov

## 2020-10-23 DIAGNOSIS — G4730 Sleep apnea, unspecified: Secondary | ICD-10-CM | POA: Diagnosis not present

## 2020-10-23 DIAGNOSIS — Z01818 Encounter for other preprocedural examination: Secondary | ICD-10-CM | POA: Diagnosis not present

## 2020-10-23 DIAGNOSIS — I10 Essential (primary) hypertension: Secondary | ICD-10-CM | POA: Diagnosis not present

## 2020-10-23 DIAGNOSIS — Z23 Encounter for immunization: Secondary | ICD-10-CM | POA: Diagnosis not present

## 2020-10-30 DIAGNOSIS — Z01810 Encounter for preprocedural cardiovascular examination: Secondary | ICD-10-CM | POA: Diagnosis not present

## 2020-11-01 ENCOUNTER — Other Ambulatory Visit: Payer: Self-pay | Admitting: Family Medicine

## 2020-11-01 DIAGNOSIS — I10 Essential (primary) hypertension: Secondary | ICD-10-CM

## 2020-11-01 NOTE — Telephone Encounter (Signed)
Okay to refuse? Pt has not been seen since 2020 

## 2020-11-04 DIAGNOSIS — Z6841 Body Mass Index (BMI) 40.0 and over, adult: Secondary | ICD-10-CM | POA: Diagnosis not present

## 2020-11-04 DIAGNOSIS — I10 Essential (primary) hypertension: Secondary | ICD-10-CM | POA: Diagnosis not present

## 2020-11-04 DIAGNOSIS — Z7689 Persons encountering health services in other specified circumstances: Secondary | ICD-10-CM | POA: Diagnosis not present

## 2020-11-04 DIAGNOSIS — Z01818 Encounter for other preprocedural examination: Secondary | ICD-10-CM | POA: Diagnosis not present

## 2020-11-04 DIAGNOSIS — Z713 Dietary counseling and surveillance: Secondary | ICD-10-CM | POA: Diagnosis not present

## 2020-11-15 DIAGNOSIS — I10 Essential (primary) hypertension: Secondary | ICD-10-CM | POA: Diagnosis not present

## 2020-11-15 DIAGNOSIS — I159 Secondary hypertension, unspecified: Secondary | ICD-10-CM | POA: Diagnosis not present

## 2020-11-15 DIAGNOSIS — Z23 Encounter for immunization: Secondary | ICD-10-CM | POA: Diagnosis not present

## 2020-12-12 DIAGNOSIS — Z6841 Body Mass Index (BMI) 40.0 and over, adult: Secondary | ICD-10-CM | POA: Diagnosis not present

## 2020-12-12 DIAGNOSIS — Z8659 Personal history of other mental and behavioral disorders: Secondary | ICD-10-CM | POA: Diagnosis not present

## 2020-12-12 DIAGNOSIS — E669 Obesity, unspecified: Secondary | ICD-10-CM | POA: Diagnosis not present

## 2020-12-12 DIAGNOSIS — F419 Anxiety disorder, unspecified: Secondary | ICD-10-CM | POA: Diagnosis not present

## 2020-12-13 DIAGNOSIS — F419 Anxiety disorder, unspecified: Secondary | ICD-10-CM | POA: Diagnosis not present

## 2020-12-13 DIAGNOSIS — Z8659 Personal history of other mental and behavioral disorders: Secondary | ICD-10-CM | POA: Diagnosis not present

## 2020-12-24 DIAGNOSIS — M4726 Other spondylosis with radiculopathy, lumbar region: Secondary | ICD-10-CM | POA: Diagnosis not present

## 2020-12-25 DIAGNOSIS — Z6841 Body Mass Index (BMI) 40.0 and over, adult: Secondary | ICD-10-CM | POA: Diagnosis not present

## 2020-12-25 DIAGNOSIS — Z01818 Encounter for other preprocedural examination: Secondary | ICD-10-CM | POA: Diagnosis not present

## 2020-12-25 DIAGNOSIS — K2950 Unspecified chronic gastritis without bleeding: Secondary | ICD-10-CM | POA: Diagnosis not present

## 2020-12-30 ENCOUNTER — Other Ambulatory Visit: Payer: Self-pay

## 2020-12-30 ENCOUNTER — Ambulatory Visit (INDEPENDENT_AMBULATORY_CARE_PROVIDER_SITE_OTHER): Payer: BC Managed Care – PPO | Admitting: Family Medicine

## 2020-12-30 ENCOUNTER — Encounter: Payer: Self-pay | Admitting: Family Medicine

## 2020-12-30 VITALS — BP 128/84 | HR 69 | Temp 99.0°F | Resp 18 | Ht 67.0 in | Wt 298.0 lb

## 2020-12-30 DIAGNOSIS — M545 Low back pain, unspecified: Secondary | ICD-10-CM

## 2020-12-30 DIAGNOSIS — N939 Abnormal uterine and vaginal bleeding, unspecified: Secondary | ICD-10-CM | POA: Diagnosis not present

## 2020-12-30 DIAGNOSIS — J40 Bronchitis, not specified as acute or chronic: Secondary | ICD-10-CM | POA: Diagnosis not present

## 2020-12-30 DIAGNOSIS — I10 Essential (primary) hypertension: Secondary | ICD-10-CM

## 2020-12-30 DIAGNOSIS — F430 Acute stress reaction: Secondary | ICD-10-CM

## 2020-12-30 DIAGNOSIS — K644 Residual hemorrhoidal skin tags: Secondary | ICD-10-CM

## 2020-12-30 LAB — POCT URINE PREGNANCY: Preg Test, Ur: NEGATIVE

## 2020-12-30 MED ORDER — ALBUTEROL SULFATE HFA 108 (90 BASE) MCG/ACT IN AERS
INHALATION_SPRAY | RESPIRATORY_TRACT | 1 refills | Status: DC
Start: 2020-12-30 — End: 2021-03-25

## 2020-12-30 MED ORDER — HYDROCHLOROTHIAZIDE 25 MG PO TABS: 1.0000 | ORAL_TABLET | Freq: Every day | ORAL | 0 refills | Status: DC | PRN

## 2020-12-30 MED ORDER — LORAZEPAM 0.5 MG PO TABS: 0.5000 mg | ORAL_TABLET | Freq: Two times a day (BID) | ORAL | 1 refills | Status: DC | PRN

## 2020-12-30 MED ORDER — CYCLOBENZAPRINE HCL 10 MG PO TABS: 10.0000 mg | ORAL_TABLET | Freq: Three times a day (TID) | ORAL | 0 refills | Status: DC | PRN

## 2020-12-30 MED ORDER — HYDROCORTISONE ACETATE 25 MG RE SUPP: 25.0000 mg | Freq: Two times a day (BID) | RECTAL | 0 refills | Status: DC

## 2020-12-30 NOTE — Patient Instructions (Signed)
Abnormal Uterine Bleeding  Abnormal uterine bleeding is unusual bleeding from the uterus. It includes bleeding after sex, or bleeding or spotting between menstrual periods. It may also include bleeding that is heavier than normal, menstrual periods that last longer than usual, or bleeding that occurs after menopause. Abnormal uterine bleeding can affect teenagers, women in their reproductive years, pregnant women, and women who have reached menopause. Common causes of abnormal uterine bleeding include:  Pregnancy.  Growths of tissue (polyps).  Benign tumors or growths in the uterus (fibroids). These are not cancer.  Infection.  Cancer.  Too much or too little of some hormones in the body (hormonal imbalances). Any type of abnormal bleeding should be checked by a health care provider. Many cases are minor and simple to treat, but others may be more serious. Treatment will depend on the cause and severity of the bleeding. Follow these instructions at home: Medicines  Take over-the-counter and prescription medicines only as told by your health care provider.  Tell your health care provider about other medicines that you take. You may be asked to stop taking aspirin or medicines that contain aspirin. These medicines can make bleeding worse.  If you were prescribed iron pills, take them as told by your health care provider. Iron pills help to replace iron that your body loses because of this condition. Managing constipation In cases of severe bleeding, you may be asked to increase your iron intake to treat anemia. This may cause constipation. To prevent or treat constipation, you may need to:  Drink enough fluid to keep your urine pale yellow.  Take over-the-counter or prescription medicines.  Eat foods that are high in fiber, such as beans, whole grains, and fresh fruits and vegetables.  Limit foods that are high in fat and processed sugars, such as fried or sweet foods. General  instructions  Monitor your condition for any changes.  Do not use tampons, douche, or have sex until your health care provider says these things are okay.  Change your pads often.  Get regular exams. This includes pelvic exams and cervical cancer screenings. ? It is up to you to get the results of any tests that are done. Ask your health care provider, or the department that is doing the tests, when your results will be ready.  Keep all follow-up visits as told by your health care provider. This is important. Contact a health care provider if you:  Have bleeding that lasts for more than 1 week.  Feel dizzy at times.  Feel nauseous or you vomit.  Feel light-headed or weak.  Notice any other changes that show that your condition is getting worse. Get help right away if you:  Pass out.  Have bleeding that soaks through a pad every hour.  Have pain in the abdomen.  Have a fever or chills.  Become sweaty or weak.  Pass large blood clots from your vagina. Summary  Abnormal uterine bleeding is unusual bleeding from the uterus.  Any type of abnormal bleeding should be evaluated by a health care provider. Many cases are minor and simple to treat, but others may be more serious.  Treatment will depend on the cause of the bleeding.  Get help right away if you pass out, you have bleeding that soaks through a pad every hour, or you pass large blood clots from your vagina. This information is not intended to replace advice given to you by your health care provider. Make sure you discuss any questions you   have with your health care provider. Document Revised: 05/08/2020 Document Reviewed: 07/04/2019 Elsevier Patient Education  2021 [employer]  

## 2020-12-30 NOTE — Assessment & Plan Note (Signed)
Well controlled, no changes to meds. Encouraged heart healthy diet such as the DASH diet and exercise as tolerated.  °

## 2020-12-30 NOTE — Assessment & Plan Note (Signed)
Check US pelvis  Check labs  preg test neg

## 2020-12-30 NOTE — Progress Notes (Signed)
Patient ID: Jody [name], female    DOB: [birth year]  Age: 50 y.o. MRN: [id number]    Subjective:  Subjective  HPI Jody [name] presents for an office visit today. She complains of irregular cycle and bleeding. She states it occurred x1 week and has recently noticed blood clots. She notes that her menstruation cycle last month lasted 5 days. However, this month she reports that it lasted 2 weeks and is heavier than usual. She also complains of back pain secondary to her weight. She denies any chest pain, SOB, fever, abdominal pain, cough, chills, sore throat, dysuria, urinary incontinence,HA, or N/V/D at this time. She is considering undergoing surgery to lose weight.  Review of Systems  Constitutional: Negative for chills, fatigue and fever.  HENT: Negative for congestion, ear pain, sinus pressure, sinus pain and sore throat.   Eyes: Negative for pain.  Respiratory: Negative for cough and shortness of breath.   Cardiovascular: Negative for chest pain, palpitations and leg swelling.  Gastrointestinal: Negative for abdominal pain, blood in stool, constipation, diarrhea, nausea and vomiting.  Genitourinary: Positive for menstrual problem (irregular cycle with blood clots as well as heavier than usual). Negative for dysuria, frequency, hematuria and urgency.  Musculoskeletal: Positive for back pain (Secondary to her weight).  Neurological: Negative for dizziness, weakness and headaches.    History Past Medical History:  Diagnosis Date  . Anemia   . Anxiety   . Asthma   . Depression   . GERD (gastroesophageal reflux disease)   . History of anal fissures   . History of gallstones   . Hypertension   . Migraine headache with aura   . Migraines   . Obesity     She has a past surgical history that includes Cholecystectomy; Tubal ligation; and Flexible sigmoidoscopy (03/09/2012).   Her family history includes Arthritis in her father; Breast cancer in her mother; Cataracts  in her maternal grandmother; Coronary artery disease in her maternal grandmother; Diabetes in her father; Glaucoma in her maternal grandmother; Heart attack (age of onset: 53) in her father; Heart disease in her maternal grandmother; Hepatitis (age of onset: 34) in her father; Hypertension in her father and mother; Kidney disease in her father; Obesity in her paternal grandmother and another family member.She reports that she has never smoked. She has never used smokeless tobacco. She reports that she does not drink alcohol and does not use drugs.  Current Outpatient Medications on File Prior to Visit  Medication Sig Dispense Refill  . fluticasone (FLONASE) 50 MCG/ACT nasal spray SPRAY 2 SPRAYS INTO EACH NOSTRIL EVERY DAY    . metoprolol tartrate (LOPRESSOR) 50 MG tablet Take 1 tablet (50 mg total) by mouth 2 (two) times daily. 180 tablet 1  . Multiple Vitamin (MULTIVITAMIN) tablet Take 1 tablet by mouth daily.     No current facility-administered medications on file prior to visit.     Objective:  Objective  Physical Exam Vitals and nursing note reviewed.  Constitutional:      General: She is not in acute distress.    Appearance: Normal appearance. She is well-developed. She is not ill-appearing.  HENT:     Head: Normocephalic and atraumatic.     Right Ear: External ear normal.     Left Ear: External ear normal.     Nose: Nose normal.  Eyes:     Extraocular Movements: Extraocular movements intact.     Pupils: Pupils are equal, round, and reactive to light.  Cardiovascular:  Rate and Rhythm: Normal rate and regular rhythm.     Pulses: Normal pulses.     Heart sounds: Normal heart sounds. No murmur heard. No friction rub. No gallop.   Pulmonary:     Effort: Pulmonary effort is normal. No respiratory distress.     Breath sounds: Normal breath sounds. No stridor. No wheezing, rhonchi or rales.  Abdominal:     General: Bowel sounds are normal. There is no distension.      Palpations: Abdomen is soft. There is no mass.     Tenderness: There is no abdominal tenderness. There is no guarding.     Hernia: No hernia is present.  Musculoskeletal:        General: Normal range of motion.     Cervical back: Normal range of motion and neck supple.  Skin:    General: Skin is warm and dry.  Neurological:     Mental Status: She is alert and oriented to person, place, and time.  Psychiatric:        Behavior: Behavior normal.        Thought Content: Thought content normal.    BP 128/84 (BP Location: Left Arm, Patient Position: Sitting, Cuff Size: Large)   Pulse 69   Temp 99 F (37.2 C) (Oral)   Resp 18   Ht 5\' 7"  (1.702 m)   Wt 298 lb (135.2 kg)   SpO2 98%   BMI 46.67 kg/m  Wt Readings from Last 3 Encounters:  12/30/20 298 lb (135.2 kg)  08/21/19 296 lb 9.6 oz (134.5 kg)  06/08/19 (!) 300 lb 4 oz (136.2 kg)     Lab Results  Component Value Date   WBC 4.7 05/10/2020   HGB 12.5 05/10/2020   HCT 38.8 05/10/2020   PLT 160 05/10/2020   GLUCOSE 122 (H) 05/10/2020   CHOL 140 08/21/2019   TRIG 55.0 08/21/2019   HDL 44.60 08/21/2019   LDLCALC 84 08/21/2019   ALT 11 08/21/2019   AST 16 08/21/2019   NA 141 05/10/2020   K 3.3 (L) 05/10/2020   CL 104 05/10/2020   CREATININE 0.76 05/10/2020   BUN 6 05/10/2020   CO2 23 05/10/2020   TSH 0.79 08/21/2019   INR 1.03 12/11/2010   HGBA1C 5.9 06/03/2015   MICROALBUR 1.1 05/14/2017    DG Chest Portable 1 View  Result Date: 05/10/2020 CLINICAL DATA:  50 year old female with positive COVID-19. EXAM: PORTABLE CHEST 1 VIEW COMPARISON:  None. FINDINGS: Bilateral confluent hazy densities most consistent with multifocal pneumonia and in keeping with COVID-19. No large pleural effusion or pneumothorax. The cardiac silhouette is within limits. No acute osseous pathology. IMPRESSION: Multifocal pneumonia. Electronically Signed   By: 59 M.D.   On: 05/10/2020 01:49     Assessment & Plan:  Plan    Meds  ordered this encounter  Medications  . cyclobenzaprine (FLEXERIL) 10 MG tablet    Sig: Take 1 tablet (10 mg total) by mouth 3 (three) times daily as needed for muscle spasms.    Dispense:  30 tablet    Refill:  0  . albuterol (VENTOLIN HFA) 108 (90 Base) MCG/ACT inhaler    Sig: TAKE 2 PUFFS BY MOUTH EVERY 6 HOURS AS NEEDED FOR WHEEZE OR SHORTNESS OF BREATH    Dispense:  18 g    Refill:  1  . LORazepam (ATIVAN) 0.5 MG tablet    Sig: Take 1 tablet (0.5 mg total) by mouth 2 (two) times daily as needed for  anxiety.    Dispense:  30 tablet    Refill:  1    This request is for a new prescription for a controlled substance as required by Federal/State law. DX Code Needed  .  . hydrocortisone (ANUSOL-HC) 25 MG suppository    Sig: Place 1 suppository (25 mg total) rectally 2 (two) times daily.    Dispense:  24 suppository    Refill:  0  . hydrochlorothiazide (HYDRODIURIL) 25 MG tablet    Sig: Take 1 tablet (25 mg total) by mouth daily as needed.    Dispense:  30 tablet    Refill:  0    Needs office visit for further refills.    Problem List Items Addressed This Visit      Unprioritized   Abnormal vaginal bleeding - Primary    Check US pelvis  Check labs  preg test neg       Relevant Orders   CBC with Differential/Platelet   TSH   POCT urine pregnancy (Completed)   US Pelvic Complete With Transvaginal   Ambulatory referral to Obstetrics / Gynecology   Essential hypertension    Well controlled, no changes to meds. Encouraged heart healthy diet such as the DASH diet and exercise as tolerated.       Relevant Medications   hydrochlorothiazide (HYDRODIURIL) 25 MG tablet   Other Relevant Orders   CBC with Differential/Platelet   Lipid panel   TSH   POCT urine pregnancy (Completed)   Low back pain with radiation   Relevant Medications   cyclobenzaprine (FLEXERIL) 10 MG tablet   Stress reaction   Relevant Medications   LORazepam (ATIVAN) 0.5 MG tablet    Other Visit  Diagnoses    Bronchitis       Relevant Medications   albuterol (VENTOLIN HFA) 108 (90 Base) MCG/ACT inhaler   External hemorrhoid       Relevant Medications   hydrocortisone (ANUSOL-HC) 25 MG suppository   hydrochlorothiazide (HYDRODIURIL) 25 MG tablet      Follow-up: Return in about 6 months (around 07/01/2021), or if symptoms worsen or fail to improve, for hypertension, annual exam, fasting, htn, hyperlipidemia, DM.   I,Gordon Zheng,acting as a scribe for [employer], DO.,have documented all relevant documentation on the behalf of Donato Schultz, DO,as directed by  Donato Schultz, DO while in the presence of Donato Schultz, DO.  I, Donato Schultz, DO, have reviewed all documentation for this visit. The documentation on 12/30/20 for the exam, diagnosis, procedures, and orders are all accurate and complete.

## 2020-12-31 LAB — LIPID PANEL
Cholesterol: 123 mg/dL (ref 0–200)
HDL: 42.8 mg/dL (ref >39.00)
LDL Cholesterol: 65 mg/dL (ref 0–99)
NonHDL: 80.07
Total CHOL/HDL Ratio: 3
Triglycerides: 77 mg/dL (ref 0.0–149.0)
VLDL: 15.4 mg/dL (ref 0.0–40.0)

## 2020-12-31 LAB — CBC WITH DIFFERENTIAL/PLATELET
Basophils Absolute: 0.1 10*3/uL (ref 0.0–0.1)
Basophils Relative: 1.4 % (ref 0.0–3.0)
Eosinophils Absolute: 0.2 10*3/uL (ref 0.0–0.7)
Eosinophils Relative: 3 % (ref 0.0–5.0)
HCT: 37.7 % (ref 36.0–46.0)
Hemoglobin: 12.1 g/dL (ref 12.0–15.0)
Lymphocytes Relative: 53.8 % — ABNORMAL HIGH (ref 12.0–46.0)
Lymphs Abs: 3.7 10*3/uL (ref 0.7–4.0)
MCHC: 32.1 g/dL (ref 30.0–36.0)
MCV: 85.6 fl (ref 78.0–100.0)
Monocytes Absolute: 0.6 10*3/uL (ref 0.1–1.0)
Monocytes Relative: 8.5 % (ref 3.0–12.0)
Neutro Abs: 2.3 10*3/uL (ref 1.4–7.7)
Neutrophils Relative %: 33.3 % — ABNORMAL LOW (ref 43.0–77.0)
Platelets: 269 10*3/uL (ref 150.0–400.0)
RBC: 4.41 Mil/uL (ref 3.87–5.11)
RDW: 16.3 % — ABNORMAL HIGH (ref 11.5–15.5)
WBC: 6.9 10*3/uL (ref 4.0–10.5)

## 2020-12-31 LAB — TSH: TSH: 0.8 u[IU]/mL (ref 0.35–4.50)

## 2021-01-01 ENCOUNTER — Ambulatory Visit (HOSPITAL_BASED_OUTPATIENT_CLINIC_OR_DEPARTMENT_OTHER)
Admission: RE | Admit: 2021-01-01 | Discharge: 2021-01-01 | Disposition: A | Discharge status: Home / Self Care | Payer: BC Managed Care – PPO | Source: Ambulatory Visit | Attending: Family Medicine | Admitting: Family Medicine

## 2021-01-01 DIAGNOSIS — N939 Abnormal uterine and vaginal bleeding, unspecified: Secondary | ICD-10-CM | POA: Diagnosis not present

## 2021-01-03 DIAGNOSIS — M4726 Other spondylosis with radiculopathy, lumbar region: Secondary | ICD-10-CM | POA: Diagnosis not present

## 2021-01-07 DIAGNOSIS — M4726 Other spondylosis with radiculopathy, lumbar region: Secondary | ICD-10-CM | POA: Diagnosis not present

## 2021-01-09 DIAGNOSIS — M4726 Other spondylosis with radiculopathy, lumbar region: Secondary | ICD-10-CM | POA: Diagnosis not present

## 2021-01-13 DIAGNOSIS — M4726 Other spondylosis with radiculopathy, lumbar region: Secondary | ICD-10-CM | POA: Diagnosis not present

## 2021-01-15 DIAGNOSIS — M4726 Other spondylosis with radiculopathy, lumbar region: Secondary | ICD-10-CM | POA: Diagnosis not present

## 2021-01-20 DIAGNOSIS — M4726 Other spondylosis with radiculopathy, lumbar region: Secondary | ICD-10-CM | POA: Diagnosis not present

## 2021-01-22 DIAGNOSIS — M4726 Other spondylosis with radiculopathy, lumbar region: Secondary | ICD-10-CM | POA: Diagnosis not present

## 2021-01-23 ENCOUNTER — Other Ambulatory Visit: Payer: Self-pay | Admitting: Family Medicine

## 2021-01-23 DIAGNOSIS — I10 Essential (primary) hypertension: Secondary | ICD-10-CM

## 2021-02-04 DIAGNOSIS — M4726 Other spondylosis with radiculopathy, lumbar region: Secondary | ICD-10-CM | POA: Diagnosis not present

## 2021-02-06 DIAGNOSIS — M4726 Other spondylosis with radiculopathy, lumbar region: Secondary | ICD-10-CM | POA: Diagnosis not present

## 2021-02-12 ENCOUNTER — Encounter: Payer: Self-pay | Admitting: Obstetrics and Gynecology

## 2021-02-12 ENCOUNTER — Ambulatory Visit: Payer: BC Managed Care – PPO | Admitting: Obstetrics and Gynecology

## 2021-02-12 ENCOUNTER — Other Ambulatory Visit: Payer: Self-pay

## 2021-02-12 VITALS — BP 132/84 | HR 84 | Ht 64.5 in | Wt 292.0 lb

## 2021-02-12 DIAGNOSIS — N939 Abnormal uterine and vaginal bleeding, unspecified: Secondary | ICD-10-CM | POA: Diagnosis not present

## 2021-02-12 LAB — PREGNANCY, URINE: Preg Test, Ur: NEGATIVE

## 2021-02-12 NOTE — Progress Notes (Signed)
GYNECOLOGY  VISIT   HPI: 50 y.o.   Single  African American  female   [id number] with Patient's last menstrual period was 01/26/2021 (approximate).   here for heavy painful cycles and last 2-3 weeks since 11/2020. May cycle normal. Patient states she does pass large clots. She states was having normal cycles until 3 months ago--last 5 days.   Needing to wear tampon and a pad.   Cycle in May lasted 3 - 4 days.  Cycle in March and April each lasted about 3 weeks.   Some pain last month with cycle. Uses Ibuprofen for pain.   Pelvic US 06/08/19:  Uterus with no myometrial masses.  Changes consistent with adenomyosis.  Essure noted bilaterally.  EMS 6.59 mm. Ovaries normal.  No free fluid.  Pelvic US 01/01/21: CLINICAL DATA:  Irregular vaginal bleeding  EXAM: TRANSABDOMINAL AND TRANSVAGINAL ULTRASOUND OF PELVIS  TECHNIQUE: Both transabdominal and transvaginal ultrasound examinations of the pelvis were performed. Transabdominal technique was performed for global imaging of the pelvis including uterus, ovaries, adnexal regions, and pelvic cul-de-sac. It was necessary to proceed with endovaginal exam following the transabdominal exam to visualize the uterus endometrium ovaries.  COMPARISON:  06/08/2019  FINDINGS: Uterus  Measurements: 9.7 x 5.3 x 5.7 cm = volume: 153.6 mL. No fibroids or other mass visualized.  Endometrium  Thickness: 11 mm.  No focal abnormality visualized.  Right ovary  Measurements: 3 x 1.6 x 1.8 cm = volume: 4.5 mL. Normal appearance/no adnexal mass.  Left ovary  Measurements: 4.1 x 2.1 x 2.5 cm = volume: 11.3 mL. Normal appearance/no adnexal mass.  Other findings  No abnormal free fluid.  IMPRESSION: Negative pelvic ultrasound   Electronically Signed   By: Jasmine Pang M.D.   On: 01/01/2021 20:21  Will have laparoscopic gastric sleeve surgery in July, 2022 at [facility].  Not sexually active for 8 years.   GYNECOLOGIC  HISTORY: Patient's last menstrual period was 01/26/2021 (approximate). Contraception: Essure Menopausal hormone therapy:  none Last mammogram: 08-14-19 Neg/BiRads1 Last pap smear:  07-25-18 Neg:Neg HR HPV, 06-13-13 Neg:Neg HR HPV, 01-29-12 Neg         OB History    Gravida  3   Para  3   Term  0   Preterm  0   AB  0   Living  3     SAB  0   IAB  0   Ectopic  0   Multiple  0   Live Births  0              Patient Active Problem List   Diagnosis Date Noted  . Abnormal vaginal bleeding 12/30/2020  . Endometriosis 08/21/2019  . Low back pain with radiation 04/28/2019  . Stress reaction 05/16/2017  . Essential hypertension 06/18/2015  . Hyperglycemia 06/18/2015  . Hemorrhoids 01/31/2012  . Fatigue 12/31/2011  . Abdominal pain, epigastric 12/31/2011  . B12 deficiency 01/13/2011  . Anxiety and depression 12/26/2010  . Migraines 12/15/2010  . Stress 12/15/2010  . OBESITY, MORBID 04/04/2007    Past Medical History:  Diagnosis Date  . Anemia   . Anxiety   . Asthma   . Depression   . GERD (gastroesophageal reflux disease)   . History of anal fissures   . History of gallstones   . Hypertension   . Migraine headache with aura   . Migraines    without aura  . Obesity     Past Surgical History:  Procedure Laterality Date  .  CHOLECYSTECTOMY    . FLEXIBLE SIGMOIDOSCOPY  03/09/2012   Procedure: FLEXIBLE SIGMOIDOSCOPY;  Surgeon: Louis Meckel, MD;  Location: [facility];  Service: Endoscopy;  Laterality: N/A;  . TUBAL LIGATION     Essure    Current Outpatient Medications  Medication Sig Dispense Refill  . albuterol (VENTOLIN HFA) 108 (90 Base) MCG/ACT inhaler TAKE 2 PUFFS BY MOUTH EVERY 6 HOURS AS NEEDED FOR WHEEZE OR SHORTNESS OF BREATH 18 g 1  . Ascorbic Acid (VITAMIN C) 1000 MG tablet Take 1,000 mg by mouth daily.    . cyclobenzaprine (FLEXERIL) 10 MG tablet Take 1 tablet (10 mg total) by mouth 3 (three) times daily as needed for muscle spasms. 30  tablet 0  . fluticasone (FLONASE) 50 MCG/ACT nasal spray SPRAY 2 SPRAYS INTO EACH NOSTRIL EVERY DAY    . gabapentin (NEURONTIN) 100 MG capsule Take 100-200 mg by mouth at bedtime.    . hydrochlorothiazide (HYDRODIURIL) 25 MG tablet TAKE 1 TABLET BY MOUTH DAILY AS NEEDED. 30 tablet 2  . hydrocortisone (ANUSOL-HC) 25 MG suppository Place 1 suppository (25 mg total) rectally 2 (two) times daily. 24 suppository 0  . LORazepam (ATIVAN) 0.5 MG tablet Take 1 tablet (0.5 mg total) by mouth 2 (two) times daily as needed for anxiety. 30 tablet 1  . metoprolol tartrate (LOPRESSOR) 50 MG tablet Take 1 tablet (50 mg total) by mouth 2 (two) times daily. 180 tablet 1  . Multiple Vitamin (MULTIVITAMIN) tablet Take 1 tablet by mouth daily.    . Vitamin D, Ergocalciferol, (DRISDOL) 1.25 MG (50000 UNIT) CAPS capsule Take by mouth.     No current facility-administered medications for this visit.     ALLERGIES: Patient has no known allergies.  Family History  Problem Relation Age of Onset  . Breast cancer Mother   . Hypertension Mother   . Hepatitis Father 51       Hep C  . Diabetes Father   . Arthritis Father   . Hypertension Father   . Kidney disease Father   . Heart attack Father 25  . Coronary artery disease Maternal Grandmother   . Cataracts Maternal Grandmother   . Glaucoma Maternal Grandmother   . Heart disease Maternal Grandmother   . Obesity Other   . Obesity Paternal Grandmother     Social History   Socioeconomic History  . Marital status: Single    Spouse name: Not on file  . Number of children: 3  . Years of education: Not on file  . Highest education level: Not on file  Occupational History  . Occupation: disability    Employer: [employer]  Tobacco Use  . Smoking status: Never Smoker  . Smokeless tobacco: Never Used  Vaping Use  . Vaping Use: Never used  Substance and Sexual Activity  . Alcohol use: No  . Drug use: No  . Sexual activity: Not Currently     Partners: Male    Comment: Essure  Other Topics Concern  . Not on file  Social History Narrative   Exercise-- trying to do some steps   Social Determinants of Health   Financial Resource Strain: Not on file  Food Insecurity: Not on file  Transportation Needs: Not on file  Physical Activity: Not on file  Stress: Not on file  Social Connections: Not on file  Intimate Partner Violence: Not on file    Review of Systems  All other systems reviewed and are negative.   PHYSICAL EXAMINATION:  BP 132/84 (Cuff Size: Large)   Pulse 84   Ht 5' 4.5" (1.638 m)   Wt 292 lb (132.5 kg)   LMP 01/26/2021 (Approximate)   SpO2 99%   BMI 49.35 kg/m     General appearance: alert, cooperative and appears stated age Abdomen:  Obese, soft, nontender.   Pelvic: External genitalia:  no lesions              Urethra:  normal appearing urethra with no masses, tenderness or lesions              Bartholins and Skenes: normal                 Vagina: normal appearing vagina with normal color and discharge, no lesions              Cervix: no lesions                Bimanual Exam:  Uterus:  normal size, contour, position, consistency, mobility, non-tender              Adnexa: no mass, fullness, tenderness              Rectal exam: Yes.  .  Confirms.              Anus:  normal sphincter tone, no lesions  Chaperone was present for exam.  ASSESSMENT  Pelvic pain, dysmenorrhea, and low back pain.  Heavy and prolonged menstrual cycles.  Possible anovulation due to high BMI and due to perimenopausal age. Hx possible adenomyosis by prior pelvic US.  Status post Essure.  Migraine HA with aura.   HTN.  Back pain.   PLAN  Pelvic US reviewed from 2020 and 2022.  Update mammogram and annual exam.  Return for endometrial biopsy if bleeding returns as a prolonged cycle.  We discussed the possibility of Micronor or Mirena IUD.  Encouragement for upcoming weight loss surgery.    27 min  total time was  spent for this patient encounter, including preparation, face-to-face counseling with the patient, coordination of care, and documentation of the encounter.

## 2021-02-12 NOTE — Patient Instructions (Signed)
Dysfunctional Uterine Bleeding Dysfunctional uterine bleeding is abnormal bleeding from the uterus. Dysfunctional uterine bleeding includes:  A menstrual period that comes earlier or later than usual.  A menstrual period that is lighter or heavier than usual, or has large blood clots.  Vaginal bleeding between menstrual periods.  Skipping one or more menstrual periods.  Vaginal bleeding after sex.  Vaginal bleeding after menopause. Follow these instructions at home: Eating and drinking  Eat well-balanced meals. Include foods that are high in iron, such as liver, meat, shellfish, green leafy vegetables, and eggs.  To prevent or treat constipation, your health care provider may recommend that you: ? Drink enough fluid to keep your urine pale yellow. ? Take over-the-counter or prescription medicines. ? Eat foods that are high in fiber, such as beans, whole grains, and fresh fruits and vegetables. ? Limit foods that are high in fat and processed sugars, such as fried or sweet foods.   Medicines  Take over-the-counter and prescription medicines only as told by your health care provider.  Do not change medicines without talking with your health care provider.  Aspirin or medicines that contain aspirin may make the bleeding worse. Do not take those medicines: ? During the week before your menstrual period. ? During your menstrual period.  If you were prescribed iron pills, take them as told by your health care provider. Iron pills help to replace iron that your body loses because of this condition. Activity  If you need to change your sanitary pad or tampon more than one time every 2 hours: ? Lie in bed with your feet raised (elevated). ? Place a cold pack on your lower abdomen. ? Rest as much as possible until the bleeding stops or slows down.  Do not try to lose weight until the bleeding has stopped and your blood iron level is back to normal. General instructions  For two  months, write down: ? When your menstrual period starts. ? When your menstrual period ends. ? When any abnormal vaginal bleeding occurs. ? What problems you notice.  Keep all follow up visits as told by your health care provider. This is important.   Contact a health care provider if you:  Feel light-headed or weak.  Have nausea and vomiting.  Cannot eat or drink without vomiting.  Feel dizzy or have diarrhea while you are taking medicines.  Are taking birth control pills or hormones, and you want to change them or stop taking them. Get help right away if:  You develop a fever or chills.  You need to change your sanitary pad or tampon more than one time per hour.  Your vaginal bleeding becomes heavier, or your flow contains clots more often.  You develop pain in your abdomen.  You lose consciousness.  You develop a rash. Summary  Dysfunctional uterine bleeding is abnormal bleeding from the uterus.  It includes menstrual bleeding of abnormal duration, volume, or regularity.  Bleeding after sex and after menopause are also considered dysfunctional uterine bleeding. This information is not intended to replace advice given to you by your health care provider. Make sure you discuss any questions you have with your health care provider. Document Revised: 02/09/2018 Document Reviewed: 02/09/2018 Elsevier Patient Education  2021 [employer]  

## 2021-02-13 DIAGNOSIS — M4726 Other spondylosis with radiculopathy, lumbar region: Secondary | ICD-10-CM | POA: Diagnosis not present

## 2021-02-17 DIAGNOSIS — M4726 Other spondylosis with radiculopathy, lumbar region: Secondary | ICD-10-CM | POA: Diagnosis not present

## 2021-02-18 NOTE — Progress Notes (Signed)
50 y.o. G59P0003 Single African American female here for annual exam.   Had painful and prolonged menses in March and April. Had cycle in May which lasted normal amount of time.  Just start her June cycle. Tampon change in 2 hours.   Normal pelvic US 01/01/21.  EMS 11 mm with no focal abnormality.  No fibroids.  Normal ovaries.   Hgb 12.1 on 12/30/20.   Planning weight loss surgery at [facility] 03/24/21.  PCP:  Loreen Freud, DO  Patient's last menstrual periods 02/24/2021.            Sexually active: No.  The current method of family planning is Essure.    Exercising: No Smoker: No  Health Maintenance: Pap: 07-25-18 Neg:Neg HR HPV, 06-13-13 Neg:Neg HR HPV, 01-29-12 Neg  History of abnormal Pap:  no MMG: 08-14-19 Neg/BiRads1.  Done 02/22/21 at [facility], results pending.  Colonoscopy: n/a BMD:  n/a TDaP: 07/25/2018 HIV: 11/11/02019 NR Hep C: no Screening Labs:  If needed   reports that she has never smoked. She has never used smokeless tobacco. She reports that she does not drink alcohol and does not use drugs.  Past Medical History:  Diagnosis Date   Anemia    Anxiety    Asthma    Depression    GERD (gastroesophageal reflux disease)    History of anal fissures    History of gallstones    Hypertension    Migraine headache with aura    Migraines    without aura   Obesity     Past Surgical History:  Procedure Laterality Date   CHOLECYSTECTOMY     FLEXIBLE SIGMOIDOSCOPY  03/09/2012   Procedure: FLEXIBLE SIGMOIDOSCOPY;  Surgeon: Louis Meckel, MD;  Location: [facility];  Service: Endoscopy;  Laterality: N/A;   TUBAL LIGATION     Essure    Current Outpatient Medications  Medication Sig Dispense Refill   albuterol (VENTOLIN HFA) 108 (90 Base) MCG/ACT inhaler TAKE 2 PUFFS BY MOUTH EVERY 6 HOURS AS NEEDED FOR WHEEZE OR SHORTNESS OF BREATH 18 g 1   Ascorbic Acid (VITAMIN C) 1000 MG tablet Take 1,000 mg by mouth daily.     cyclobenzaprine (FLEXERIL) 10 MG tablet Take  1 tablet (10 mg total) by mouth 3 (three) times daily as needed for muscle spasms. 30 tablet 0   fluticasone (FLONASE) 50 MCG/ACT nasal spray SPRAY 2 SPRAYS INTO EACH NOSTRIL EVERY DAY     gabapentin (NEURONTIN) 100 MG capsule Take 100-200 mg by mouth at bedtime.     hydrochlorothiazide (HYDRODIURIL) 25 MG tablet TAKE 1 TABLET BY MOUTH DAILY AS NEEDED. 30 tablet 2   hydrocortisone (ANUSOL-HC) 25 MG suppository Place 1 suppository (25 mg total) rectally 2 (two) times daily. 24 suppository 0   LORazepam (ATIVAN) 0.5 MG tablet Take 1 tablet (0.5 mg total) by mouth 2 (two) times daily as needed for anxiety. 30 tablet 1   metoprolol tartrate (LOPRESSOR) 50 MG tablet Take 1 tablet (50 mg total) by mouth 2 (two) times daily. 180 tablet 1   Multiple Vitamin (MULTIVITAMIN) tablet Take 1 tablet by mouth daily.     Vitamin D, Ergocalciferol, (DRISDOL) 1.25 MG (50000 UNIT) CAPS capsule Take by mouth.     No current facility-administered medications for this visit.    Family History  Problem Relation Age of Onset   Breast cancer Mother    Hypertension Mother    Hepatitis Father 66       Hep C   Diabetes  Father    Arthritis Father    Hypertension Father    Kidney disease Father    Heart attack Father 69   Coronary artery disease Maternal Grandmother    Cataracts Maternal Grandmother    Glaucoma Maternal Grandmother    Heart disease Maternal Grandmother    Obesity Other    Obesity Paternal Grandmother     Review of Systems  Constitutional: Negative.   HENT: Negative.    Eyes: Negative.   Respiratory: Negative.    Cardiovascular: Negative.   Gastrointestinal: Negative.   Endocrine: Negative.   Genitourinary: Negative.   Musculoskeletal: Negative.   Skin: Negative.   Allergic/Immunologic: Negative.   Neurological: Negative.   Hematological: Negative.   Psychiatric/Behavioral: Negative.     Exam:   LMP 01/26/2021 (Approximate)     General appearance: alert, cooperative and appears  stated age Head: normocephalic, without obvious abnormality, atraumatic Neck: no adenopathy, supple, symmetrical, trachea midline and thyroid normal to inspection and palpation Lungs: clear to auscultation bilaterally Breasts: normal appearance, no masses or tenderness, No nipple retraction or dimpling, No nipple discharge or bleeding, No axillary adenopathy Heart: regular rate and rhythm Abdomen: soft, non-tender; no masses, no organomegaly Extremities: extremities normal, atraumatic, no cyanosis or edema Skin: skin color, texture, turgor normal. No rashes or lesions Lymph nodes: cervical, supraclavicular, and axillary nodes normal. Neurologic: grossly normal  Pelvic: External genitalia:  no lesions              No abnormal inguinal nodes palpated.              Urethra:  normal appearing urethra with no masses, tenderness or lesions              Bartholins and Skenes: normal                 Vagina: normal appearing vagina with normal color and discharge, no lesions              Cervix: no lesions.  Menstrual flow from os.               Pap taken: No. Bimanual Exam:  Uterus:  normal size, contour, position, consistency, mobility, non-tender              Adnexa: no mass, fullness, tenderness              Rectal exam: Yes.  .  Confirms.              Anus:  normal sphincter tone, no lesions  Chaperone was present for exam.  Assessment:   Well woman visit with normal exam. Heavy and prolonged menstrual cycles.  Possible anovulation due to high BMI and due to perimenopausal age. Hx possible adenomyosis by prior pelvic US.  Status post Essure. Migraine HA with aura.   HTN. Back pain.   Plan: Mammogram screening discussed. Self breast awareness reviewed. Pap and HR HPV 2024.  Guidelines for Calcium, Vitamin D, regular exercise program including cardiovascular and weight bearing exercise. Will prescribe Micronor when mammogram is back and normal.  Instructed in use.  If irregular  cycles return or heavy menstrual flow does not improve, needs endometrial biopsy.  FU in 3 months for a recheck.    Follow up annually and prn.

## 2021-02-21 ENCOUNTER — Other Ambulatory Visit: Payer: Self-pay | Admitting: Family Medicine

## 2021-02-21 DIAGNOSIS — M4726 Other spondylosis with radiculopathy, lumbar region: Secondary | ICD-10-CM | POA: Diagnosis not present

## 2021-02-21 DIAGNOSIS — Z1231 Encounter for screening mammogram for malignant neoplasm of breast: Secondary | ICD-10-CM

## 2021-02-22 ENCOUNTER — Ambulatory Visit
Admission: RE | Admit: 2021-02-22 | Discharge: 2021-02-22 | Disposition: A | Discharge status: Home / Self Care | Payer: BC Managed Care – PPO | Source: Ambulatory Visit | Attending: Family Medicine | Admitting: Family Medicine

## 2021-02-22 ENCOUNTER — Other Ambulatory Visit: Payer: Self-pay

## 2021-02-22 DIAGNOSIS — Z1231 Encounter for screening mammogram for malignant neoplasm of breast: Secondary | ICD-10-CM

## 2021-02-24 DIAGNOSIS — M4726 Other spondylosis with radiculopathy, lumbar region: Secondary | ICD-10-CM | POA: Diagnosis not present

## 2021-02-25 ENCOUNTER — Ambulatory Visit (INDEPENDENT_AMBULATORY_CARE_PROVIDER_SITE_OTHER): Payer: BC Managed Care – PPO | Admitting: Obstetrics and Gynecology

## 2021-02-25 ENCOUNTER — Encounter: Payer: Self-pay | Admitting: Obstetrics and Gynecology

## 2021-02-25 ENCOUNTER — Other Ambulatory Visit: Payer: Self-pay

## 2021-02-25 VITALS — BP 140/86 | HR 98 | Ht 64.5 in | Wt 293.0 lb

## 2021-02-25 DIAGNOSIS — Z01419 Encounter for gynecological examination (general) (routine) without abnormal findings: Secondary | ICD-10-CM

## 2021-02-25 NOTE — Patient Instructions (Signed)
EXERCISE AND DIET:  We recommended that you start or continue a regular exercise program for good health. Regular exercise means any activity that makes your heart beat faster and makes you sweat.  We recommend exercising at least 30 minutes per day at least 3 days a week, preferably 4 or 5.  We also recommend a diet low in fat and sugar.  Inactivity, poor dietary choices and obesity can cause diabetes, heart attack, stroke, and kidney damage, among others.    ALCOHOL AND SMOKING:  Women should limit their alcohol intake to no more than 7 drinks/beers/glasses of wine (combined, not each!) per week. Moderation of alcohol intake to this level decreases your risk of breast cancer and liver damage. And of course, no recreational drugs are part of a healthy lifestyle.  And absolutely no smoking or even second hand smoke. Most people know smoking can cause heart and lung diseases, but did you know it also contributes to weakening of your bones? Aging of your skin?  Yellowing of your teeth and nails?  CALCIUM AND VITAMIN D:  Adequate intake of calcium and Vitamin D are recommended.  The recommendations for exact amounts of these supplements seem to change often, but generally speaking 600 mg of calcium (either carbonate or citrate) and 800 units of Vitamin D per day seems prudent. Certain women may benefit from higher intake of Vitamin D.  If you are among these women, your doctor will have told you during your visit.    PAP SMEARS:  Pap smears, to check for cervical cancer or precancers,  have traditionally been done yearly, although recent scientific advances have shown that most women can have pap smears less often.  However, every woman still should have a physical exam from her gynecologist every year. It will include a breast check, inspection of the vulva and vagina to check for abnormal growths or skin changes, a visual exam of the cervix, and then an exam to evaluate the size and shape of the uterus and  ovaries.  And after 48 years of age, a rectal exam is indicated to check for rectal cancers. We will also provide age appropriate advice regarding health maintenance, like when you should have certain vaccines, screening for sexually transmitted diseases, bone density testing, colonoscopy, mammograms, etc.   MAMMOGRAMS:  All women over 40 years old should have a yearly mammogram. Many facilities now offer a "3D" mammogram, which may cost around $50 extra out of pocket. If possible,  we recommend you accept the option to have the 3D mammogram performed.  It both reduces the number of women who will be called back for extra views which then turn out to be normal, and it is better than the routine mammogram at detecting truly abnormal areas.    COLONOSCOPY:  Colonoscopy to screen for colon cancer is recommended for all women at age 50.  We know, you hate the idea of the prep.  We agree, BUT, having colon cancer and not knowing it is worse!!  Colon cancer so often starts as a polyp that can be seen and removed at colonscopy, which can quite literally save your life!  And if your first colonoscopy is normal and you have no family history of colon cancer, most women don't have to have it again for 10 years.  Once every ten years, you can do something that may end up saving your life, right?  We will be happy to help you get it scheduled when you are ready.    Be sure to check your insurance coverage so you understand how much it will cost.  It may be covered as a preventative service at no cost, but you should check your particular policy.    Norethindrone Tablets (Contraception) What is this medication? NORETHINDRONE (nor eth IN drone) prevents ovulation and pregnancy. It belongs to a group of medications called contraceptives. This medication is a progestinhormone. This medicine may be used for other purposes; ask your health care provider orpharmacist if you have questions. COMMON BRAND NAME(S): Camila,  Deblitane 28-Day, Errin, Heather, [place], Lyza, Nor-QD, Nora-BE, Norlyroc, Ortho Micronor, Sharobel 28-Day What should I tell my care team before I take this medication? They need to know if you have any of these conditions: Blood vessel disease or blood clots Breast, cervical, or vaginal cancer Diabetes Heart disease Kidney disease Liver disease Mental depression Migraine Seizures Stroke Vaginal bleeding An unusual or allergic reaction to norethindrone, other medications, foods, dyes, or preservatives Pregnant or trying to get pregnant Breast-feeding How should I use this medication? Take this medication by mouth with a glass of water. You may take it with or without food. Follow the directions on the prescription label. Take this medication at the same time each day and in the order directed on the package.Do not take your medication more often than directed. Contact your care team about the use of this medication in children. Special care may be needed. This medication has been used in female children who havestarted having menstrual periods. A patient package insert for the product will be given with each prescription and refill. Read this sheet carefully each time. The sheet may changefrequently. Overdosage: If you think you have taken too much of this medicine contact apoison control center or emergency room at once. NOTE: This medicine is only for you. Do not share this medicine with others. What if I miss a dose? Try not to miss a dose. Every time you miss a dose or take a dose late your chance of pregnancy increases. When 1 pill is missed (even if only 3 hours late), take the missed pill as soon as possible and continue taking a pill each day at the regular time (use a back-up method of birth control for the next 48 hours). If more than 1 dose is missed, use an additional birth control method for the rest of your pill pack until menses occurs. Contact your care team  ifmore than 1 dose has been missed. What may interact with this medication? Do not take this medication with any of the following: Amprenavir or fosamprenavir Bosentan This medication may also interact with the following: Antibiotics or medications for infections, especially rifampin, rifabutin, rifapentine, and griseofulvin, and possibly penicillins or tetracyclines Aprepitant Barbiturate medications, such as phenobarbital Carbamazepine Felbamate Modafinil Oxcarbazepine Phenytoin Ritonavir or other medications for HIV infection or AIDS St. John's wort Topiramate This list may not describe all possible interactions. Give your health care provider a list of all the medicines, herbs, non-prescription drugs, or dietary supplements you use. Also tell them if you smoke, drink alcohol, or use illegaldrugs. Some items may interact with your medicine. What should I watch for while using this medication? Visit your care team for regular checks on your progress. You will need aregular breast and pelvic exam and Pap smear while on this medication. Use an additional method of birth control during the first cycle that you takethese tablets. If you have any reason to think you are pregnant, stop taking this medicationright away and  contact your care team. If you are taking this medication for hormone related problems, it may takeseveral cycles of use to see improvement in your condition. This medication does not protect you against HIV infection (AIDS) or any othersexually transmitted diseases. What side effects may I notice from receiving this medication? Side effects that you should report to your care team as soon as possible: Allergic reactions-skin rash, itching, hives, swelling of the face, lips, tongue, or throat Blood clot-pain, swelling, or warmth in the leg, shortness of breath, chest pain Gallbladder problems-severe stomach pain, nausea, vomiting, fever Increase in blood pressure Liver  injury-right upper belly pain, loss of appetite, nausea, light-colored stool, dark yellow or brown urine, yellowing skin or eyes, unusual weakness or fatigue New or worsening migraines or headaches Stroke-sudden numbness or weakness of the face, arm, or leg, trouble speaking, confusion, trouble walking, loss of balance or coordination, dizziness, severe headache, change in vision Unusual vaginal discharge, itching, or odor Worsening mood, feelings of depression Side effects that usually do not require medical attention (report to your careteam if they continue or are bothersome): Breast pain or tenderness Dark patches of skin on the face or other sun-exposed areas Irregular menstrual cycles or spotting Nausea Weight gain This list may not describe all possible side effects. Call your doctor for medical advice about side effects. You may report side effects to FDA at1-800-FDA-1088. Where should I keep my medication? Keep out of the reach of children and pets. Store at room temperature between 15 and 30 degrees C (59 and 86 degrees F).Throw away any unused medication after the expiration date. NOTE: This sheet is a summary. It may not cover all possible information. If you have questions about this medicine, talk to your doctor, pharmacist, orhealth care provider.  2022 Elsevier/Gold Standard (2020-10-07 10:54:00)

## 2021-02-26 MED ORDER — NORETHINDRONE 0.35 MG PO TABS: 1.0000 | ORAL_TABLET | Freq: Every day | ORAL | 1 refills | Status: DC

## 2021-02-26 NOTE — Telephone Encounter (Signed)
Hi Jody [name],   I will send a prescription to your pharmacy for the Micronor birth control pills.  You may start them now.   I will see you back in 3 months for your recheck!  Conley Simmonds, MD

## 2021-02-27 DIAGNOSIS — M4726 Other spondylosis with radiculopathy, lumbar region: Secondary | ICD-10-CM | POA: Diagnosis not present

## 2021-03-03 DIAGNOSIS — M4726 Other spondylosis with radiculopathy, lumbar region: Secondary | ICD-10-CM | POA: Diagnosis not present

## 2021-03-06 DIAGNOSIS — M4726 Other spondylosis with radiculopathy, lumbar region: Secondary | ICD-10-CM | POA: Diagnosis not present

## 2021-03-10 DIAGNOSIS — M4726 Other spondylosis with radiculopathy, lumbar region: Secondary | ICD-10-CM | POA: Diagnosis not present

## 2021-03-11 DIAGNOSIS — M545 Low back pain, unspecified: Secondary | ICD-10-CM | POA: Diagnosis not present

## 2021-03-11 DIAGNOSIS — R7303 Prediabetes: Secondary | ICD-10-CM | POA: Diagnosis not present

## 2021-03-11 DIAGNOSIS — Z713 Dietary counseling and surveillance: Secondary | ICD-10-CM | POA: Diagnosis not present

## 2021-03-11 DIAGNOSIS — Z6841 Body Mass Index (BMI) 40.0 and over, adult: Secondary | ICD-10-CM | POA: Diagnosis not present

## 2021-03-11 DIAGNOSIS — K2950 Unspecified chronic gastritis without bleeding: Secondary | ICD-10-CM | POA: Diagnosis not present

## 2021-03-11 DIAGNOSIS — Z79899 Other long term (current) drug therapy: Secondary | ICD-10-CM | POA: Diagnosis not present

## 2021-03-11 DIAGNOSIS — Z8709 Personal history of other diseases of the respiratory system: Secondary | ICD-10-CM | POA: Diagnosis not present

## 2021-03-11 DIAGNOSIS — K31A11 Gastric intestinal metaplasia without dysplasia, involving the antrum: Secondary | ICD-10-CM | POA: Diagnosis not present

## 2021-03-11 DIAGNOSIS — M5116 Intervertebral disc disorders with radiculopathy, lumbar region: Secondary | ICD-10-CM | POA: Diagnosis not present

## 2021-03-11 DIAGNOSIS — Z01818 Encounter for other preprocedural examination: Secondary | ICD-10-CM | POA: Diagnosis not present

## 2021-03-11 DIAGNOSIS — I10 Essential (primary) hypertension: Secondary | ICD-10-CM | POA: Diagnosis not present

## 2021-03-12 DIAGNOSIS — M4726 Other spondylosis with radiculopathy, lumbar region: Secondary | ICD-10-CM | POA: Diagnosis not present

## 2021-03-14 HISTORY — PX: OTHER SURGICAL HISTORY: SHX169

## 2021-03-18 DIAGNOSIS — M4726 Other spondylosis with radiculopathy, lumbar region: Secondary | ICD-10-CM | POA: Diagnosis not present

## 2021-03-21 DIAGNOSIS — Z01818 Encounter for other preprocedural examination: Secondary | ICD-10-CM | POA: Diagnosis not present

## 2021-03-21 DIAGNOSIS — Z20822 Contact with and (suspected) exposure to covid-19: Secondary | ICD-10-CM | POA: Diagnosis not present

## 2021-03-24 DIAGNOSIS — Z793 Long term (current) use of hormonal contraceptives: Secondary | ICD-10-CM | POA: Diagnosis not present

## 2021-03-24 DIAGNOSIS — K2950 Unspecified chronic gastritis without bleeding: Secondary | ICD-10-CM | POA: Diagnosis not present

## 2021-03-24 DIAGNOSIS — I440 Atrioventricular block, first degree: Secondary | ICD-10-CM | POA: Diagnosis not present

## 2021-03-24 DIAGNOSIS — Z791 Long term (current) use of non-steroidal anti-inflammatories (NSAID): Secondary | ICD-10-CM | POA: Diagnosis not present

## 2021-03-24 DIAGNOSIS — I10 Essential (primary) hypertension: Secondary | ICD-10-CM | POA: Diagnosis not present

## 2021-03-24 DIAGNOSIS — Z79899 Other long term (current) drug therapy: Secondary | ICD-10-CM | POA: Diagnosis not present

## 2021-03-24 DIAGNOSIS — M5116 Intervertebral disc disorders with radiculopathy, lumbar region: Secondary | ICD-10-CM | POA: Diagnosis not present

## 2021-03-24 DIAGNOSIS — Z6841 Body Mass Index (BMI) 40.0 and over, adult: Secondary | ICD-10-CM | POA: Diagnosis not present

## 2021-03-24 DIAGNOSIS — Z01818 Encounter for other preprocedural examination: Secondary | ICD-10-CM | POA: Diagnosis not present

## 2021-03-24 DIAGNOSIS — M1990 Unspecified osteoarthritis, unspecified site: Secondary | ICD-10-CM | POA: Diagnosis not present

## 2021-03-25 ENCOUNTER — Other Ambulatory Visit: Payer: Self-pay | Admitting: Family Medicine

## 2021-03-25 DIAGNOSIS — J40 Bronchitis, not specified as acute or chronic: Secondary | ICD-10-CM

## 2021-04-08 DIAGNOSIS — Z9884 Bariatric surgery status: Secondary | ICD-10-CM | POA: Diagnosis not present

## 2021-04-08 DIAGNOSIS — Z8659 Personal history of other mental and behavioral disorders: Secondary | ICD-10-CM | POA: Diagnosis not present

## 2021-04-08 DIAGNOSIS — F419 Anxiety disorder, unspecified: Secondary | ICD-10-CM | POA: Diagnosis not present

## 2021-04-08 DIAGNOSIS — K2950 Unspecified chronic gastritis without bleeding: Secondary | ICD-10-CM | POA: Diagnosis not present

## 2021-04-08 DIAGNOSIS — Z48815 Encounter for surgical aftercare following surgery on the digestive system: Secondary | ICD-10-CM | POA: Diagnosis not present

## 2021-04-15 DIAGNOSIS — Z6841 Body Mass Index (BMI) 40.0 and over, adult: Secondary | ICD-10-CM | POA: Diagnosis not present

## 2021-04-15 DIAGNOSIS — R609 Edema, unspecified: Secondary | ICD-10-CM | POA: Diagnosis not present

## 2021-04-15 DIAGNOSIS — I10 Essential (primary) hypertension: Secondary | ICD-10-CM | POA: Diagnosis not present

## 2021-05-28 ENCOUNTER — Ambulatory Visit: Payer: BC Managed Care – PPO | Admitting: Obstetrics and Gynecology

## 2021-06-02 ENCOUNTER — Encounter: Payer: Self-pay | Admitting: Obstetrics and Gynecology

## 2021-06-02 ENCOUNTER — Ambulatory Visit (INDEPENDENT_AMBULATORY_CARE_PROVIDER_SITE_OTHER): Payer: BC Managed Care – PPO | Admitting: Obstetrics and Gynecology

## 2021-06-02 ENCOUNTER — Other Ambulatory Visit: Payer: Self-pay

## 2021-06-02 VITALS — BP 118/70 | HR 80 | Ht 64.5 in | Wt 255.0 lb

## 2021-06-02 DIAGNOSIS — Z5181 Encounter for therapeutic drug level monitoring: Secondary | ICD-10-CM | POA: Diagnosis not present

## 2021-06-02 DIAGNOSIS — N939 Abnormal uterine and vaginal bleeding, unspecified: Secondary | ICD-10-CM | POA: Diagnosis not present

## 2021-06-02 MED ORDER — NORETHINDRONE 0.35 MG PO TABS: 1.0000 | ORAL_TABLET | Freq: Every day | ORAL | 2 refills | Status: DC

## 2021-06-02 NOTE — Progress Notes (Signed)
GYNECOLOGY  VISIT   HPI: 50 y.o.   Single  African American  female   G3P0003 with Patient's last menstrual period was 04/26/2021 (approximate).   here for 3 month medication follow up.    Started Micronor for heavy and prolonged menstruation.   She has possible anovulation and adenomyosis on [place].  No EMB to date.   Menses every month and last 5 days.  Menses are not as heavy.  Changes pad or tampon twice a day.  Not as much clotting as prior to using Micronor.  No intermenstrual bleeding.  Still has cramping.  Tylenol not helpful.  Cannot take ibuprofen due to her recent weight loss.   Able to take her pills every day.  She is wanting to continue her pills.   Has migraine with aura and HTN.   Had weight loss surgery 03/24/21.  Lost about 40 pounds.  Asking about vitamin infusions IV.  GYNECOLOGIC HISTORY: Patient's last menstrual period was 04/26/2021 (approximate). Contraception:  Essure/Micronor Menopausal hormone therapy:  none Last mammogram:  02-22-21 Neg/BiRads1 Last pap smear:   07-25-18 Neg:Neg HR HPV, 06-13-13 Neg:Neg HR HPV, 01-29-12 Neg          OB History     Gravida  3   Para  3   Term  0   Preterm  0   AB  0   Living  3      SAB  0   IAB  0   Ectopic  0   Multiple  0   Live Births  0              Patient Active Problem List   Diagnosis Date Noted   Abnormal vaginal bleeding 12/30/2020   Endometriosis 08/21/2019   Low back pain with radiation 04/28/2019   Stress reaction 05/16/2017   Essential hypertension 06/18/2015   Hyperglycemia 06/18/2015   Hemorrhoids 01/31/2012   Fatigue 12/31/2011   Abdominal pain, epigastric 12/31/2011   B12 deficiency 01/13/2011   Anxiety and depression 12/26/2010   Migraines 12/15/2010   Stress 12/15/2010   OBESITY, MORBID 04/04/2007    Past Medical History:  Diagnosis Date   Anemia    Anxiety    Asthma    Depression    GERD (gastroesophageal reflux disease)    History of anal fissures     History of gallstones    Hypertension    Migraine headache with aura    Migraines    without aura   Obesity     Past Surgical History:  Procedure Laterality Date   CHOLECYSTECTOMY     FLEXIBLE SIGMOIDOSCOPY  03/09/2012   Procedure: FLEXIBLE SIGMOIDOSCOPY;  Surgeon: Louis Meckel, MD;  Location: [facility];  Service: Endoscopy;  Laterality: N/A;   gastric sleeve surgery  03/14/2021   TUBAL LIGATION     Essure    Current Outpatient Medications  Medication Sig Dispense Refill   albuterol (VENTOLIN HFA) 108 (90 Base) MCG/ACT inhaler TAKE 2 PUFFS BY MOUTH EVERY 6 HOURS AS NEEDED FOR WHEEZE OR SHORTNESS OF BREATH 8.5 each 1   Ascorbic Acid (VITAMIN C) 1000 MG tablet Take 1,000 mg by mouth daily.     cyclobenzaprine (FLEXERIL) 10 MG tablet Take 1 tablet (10 mg total) by mouth 3 (three) times daily as needed for muscle spasms. 30 tablet 0   famotidine (PEPCID) 20 MG tablet Take 20 mg by mouth 2 (two) times daily.     fluticasone (FLONASE) 50 MCG/ACT nasal spray SPRAY 2  SPRAYS INTO EACH NOSTRIL EVERY DAY     gabapentin (NEURONTIN) 100 MG capsule Take 100-200 mg by mouth at bedtime.     gabapentin (NEURONTIN) 300 MG capsule Take 300 mg by mouth 3 (three) times daily.     hydrocortisone (ANUSOL-HC) 25 MG suppository Place 1 suppository (25 mg total) rectally 2 (two) times daily. 24 suppository 0   LORazepam (ATIVAN) 0.5 MG tablet Take 1 tablet (0.5 mg total) by mouth 2 (two) times daily as needed for anxiety. 30 tablet 1   metoprolol tartrate (LOPRESSOR) 50 MG tablet Take 1 tablet (50 mg total) by mouth 2 (two) times daily. 180 tablet 1   Multiple Vitamin (MULTIVITAMIN) tablet Take 1 tablet by mouth daily.     norethindrone (ORTHO MICRONOR) 0.35 MG tablet Take 1 tablet (0.35 mg total) by mouth daily. 84 tablet 1   Vitamin D, Ergocalciferol, (DRISDOL) 1.25 MG (50000 UNIT) CAPS capsule Take by mouth.     No current facility-administered medications for this visit.     ALLERGIES: Patient  has no known allergies.  Family History  Problem Relation Age of Onset   Breast cancer Mother 60   Hypertension Mother    Hepatitis Father 87       Hep C   Diabetes Father    Arthritis Father    Hypertension Father    Kidney disease Father    Heart attack Father 82   Coronary artery disease Maternal Grandmother    Cataracts Maternal Grandmother    Glaucoma Maternal Grandmother    Heart disease Maternal Grandmother    Obesity Paternal Grandmother    Obesity Other     Social History   Socioeconomic History   Marital status: Single    Spouse name: Not on file   Number of children: 3   Years of education: Not on file   Highest education level: Not on file  Occupational History   Occupation: disability    Employer: [employer]  Tobacco Use   Smoking status: Never   Smokeless tobacco: Never  Vaping Use   Vaping Use: Never used  Substance and Sexual Activity   Alcohol use: No   Drug use: No   Sexual activity: Not Currently    Partners: Male    Comment: Essure  Other Topics Concern   Not on file  Social History Narrative   Exercise-- trying to do some steps   Social Determinants of Health   Financial Resource Strain: Not on file  Food Insecurity: Not on file  Transportation Needs: Not on file  Physical Activity: Not on file  Stress: Not on file  Social Connections: Not on file  Intimate Partner Violence: Not on file    Review of Systems  All other systems reviewed and are negative.  PHYSICAL EXAMINATION:    BP 118/70   Pulse 80   Ht 5' 4.5" (1.638 m)   Wt 255 lb (115.7 kg)   LMP 04/26/2021 (Approximate)   SpO2 97%   BMI 43.09 kg/m     General appearance: alert, cooperative and appears stated age  ASSESSMENT  Abnormal uterine bleeding. Possible anovulation and adenomyosis.  Doing well on Micronor.  Laparoscopic gastric sleeve surgery.  Successful weight loss.   PLAN  Ok to continue with Micronor.  Refills given.  She will check  with her weight loss surgeon about appropriate vitamin therapy.  Follow up for annual exam and prn.    An After Visit Summary was printed and given to the  patient.

## 2021-07-02 DIAGNOSIS — K5900 Constipation, unspecified: Secondary | ICD-10-CM | POA: Diagnosis not present

## 2021-07-02 DIAGNOSIS — Z9884 Bariatric surgery status: Secondary | ICD-10-CM | POA: Diagnosis not present

## 2021-07-02 DIAGNOSIS — Z713 Dietary counseling and surveillance: Secondary | ICD-10-CM | POA: Diagnosis not present

## 2021-07-02 DIAGNOSIS — R42 Dizziness and giddiness: Secondary | ICD-10-CM | POA: Diagnosis not present

## 2021-07-02 DIAGNOSIS — Z6841 Body Mass Index (BMI) 40.0 and over, adult: Secondary | ICD-10-CM | POA: Diagnosis not present

## 2021-07-02 DIAGNOSIS — Z48815 Encounter for surgical aftercare following surgery on the digestive system: Secondary | ICD-10-CM | POA: Diagnosis not present

## 2021-07-02 DIAGNOSIS — K912 Postsurgical malabsorption, not elsewhere classified: Secondary | ICD-10-CM | POA: Diagnosis not present

## 2021-07-02 DIAGNOSIS — R109 Unspecified abdominal pain: Secondary | ICD-10-CM | POA: Diagnosis not present

## 2021-07-02 DIAGNOSIS — K219 Gastro-esophageal reflux disease without esophagitis: Secondary | ICD-10-CM | POA: Diagnosis not present

## 2021-07-17 IMAGING — MG DIGITAL SCREENING BILAT W/ CAD
6 series · 6 of 6 positions shown · non-contrast
Comparison: Previous exam(s).

CLINICAL DATA: Screening.

EXAM:
DIGITAL SCREENING BILATERAL MAMMOGRAM WITH CAD
TECHNIQUE: Bilateral screening digital craniocaudal and mediolateral oblique
mammograms were obtained. The images were evaluated with
computer-aided detection.

[R CC (1 of 2)]
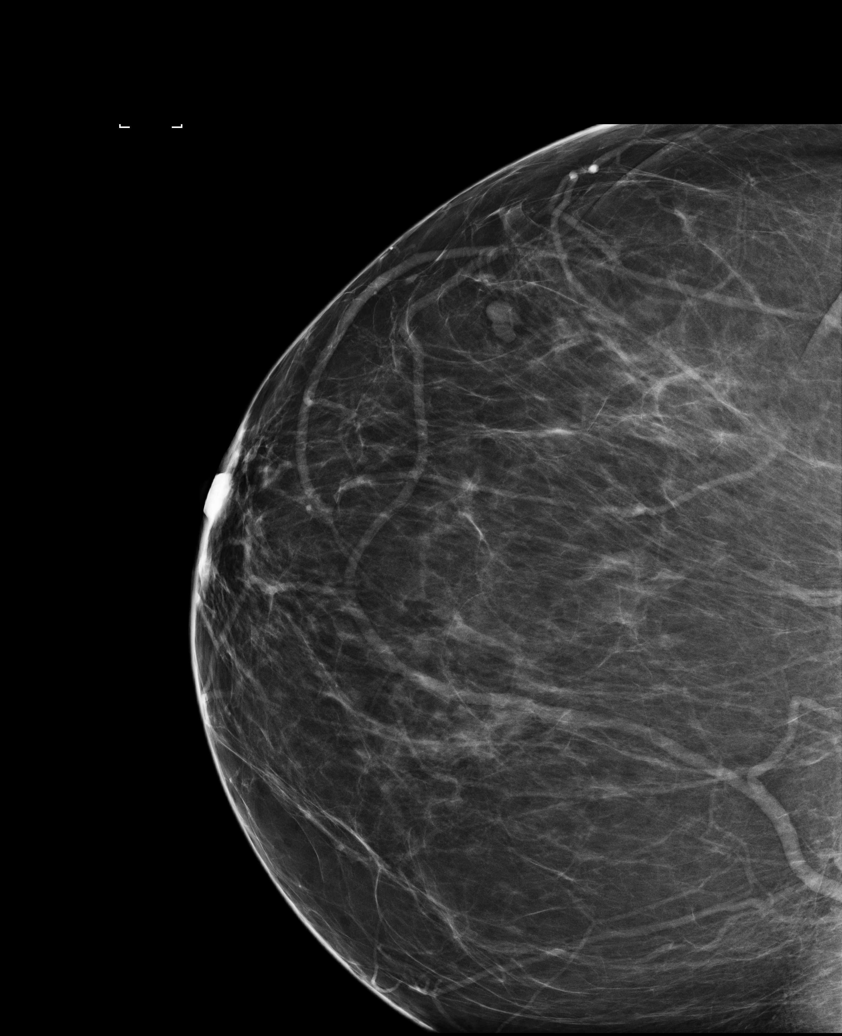

[L CC (1 of 2)]
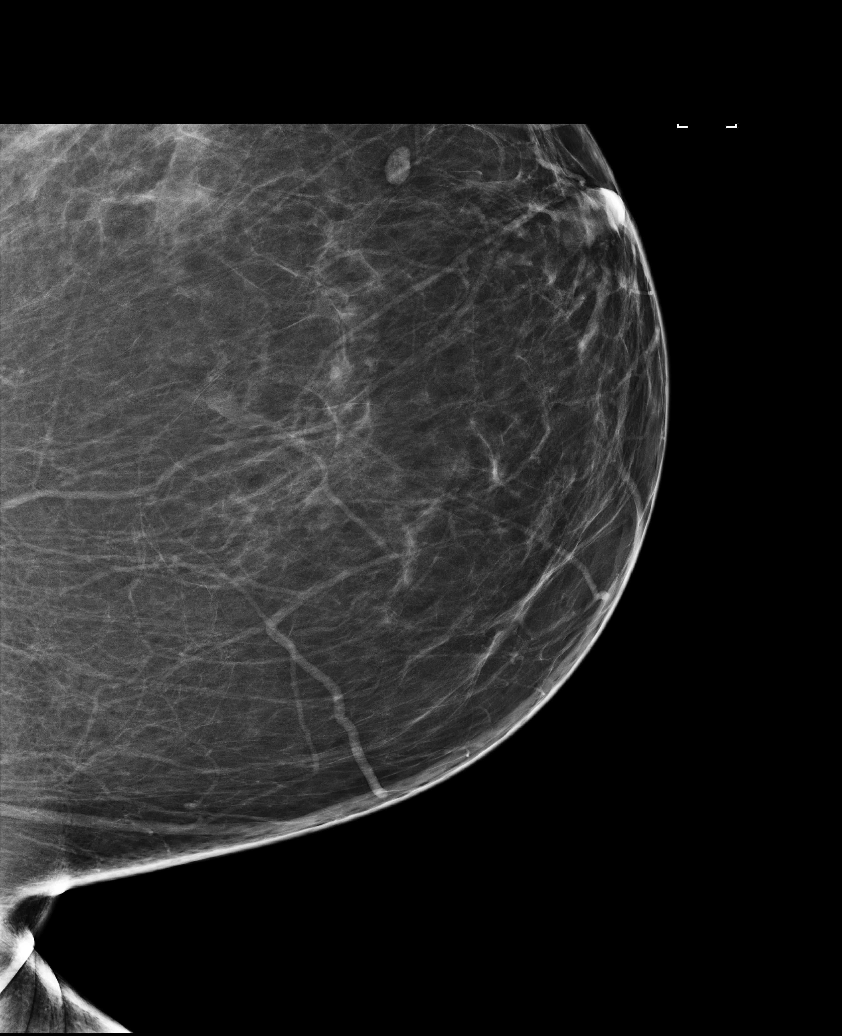

[L MLO]
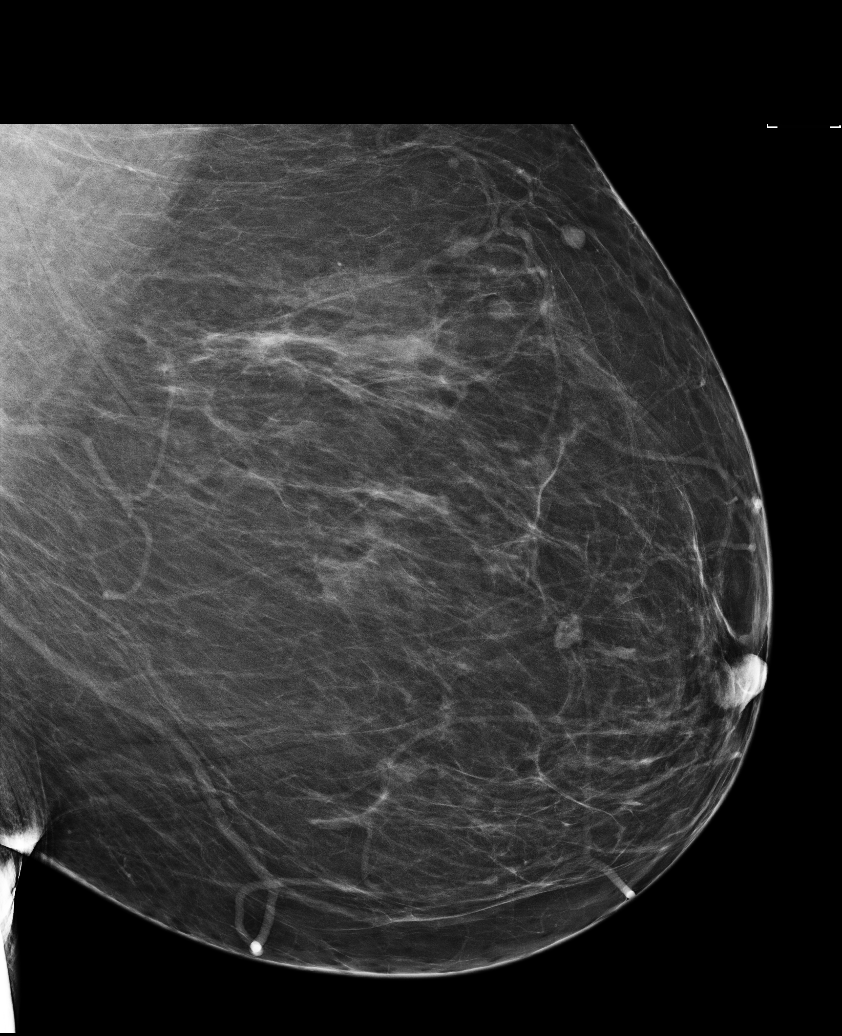

[R CC (2 of 2)]
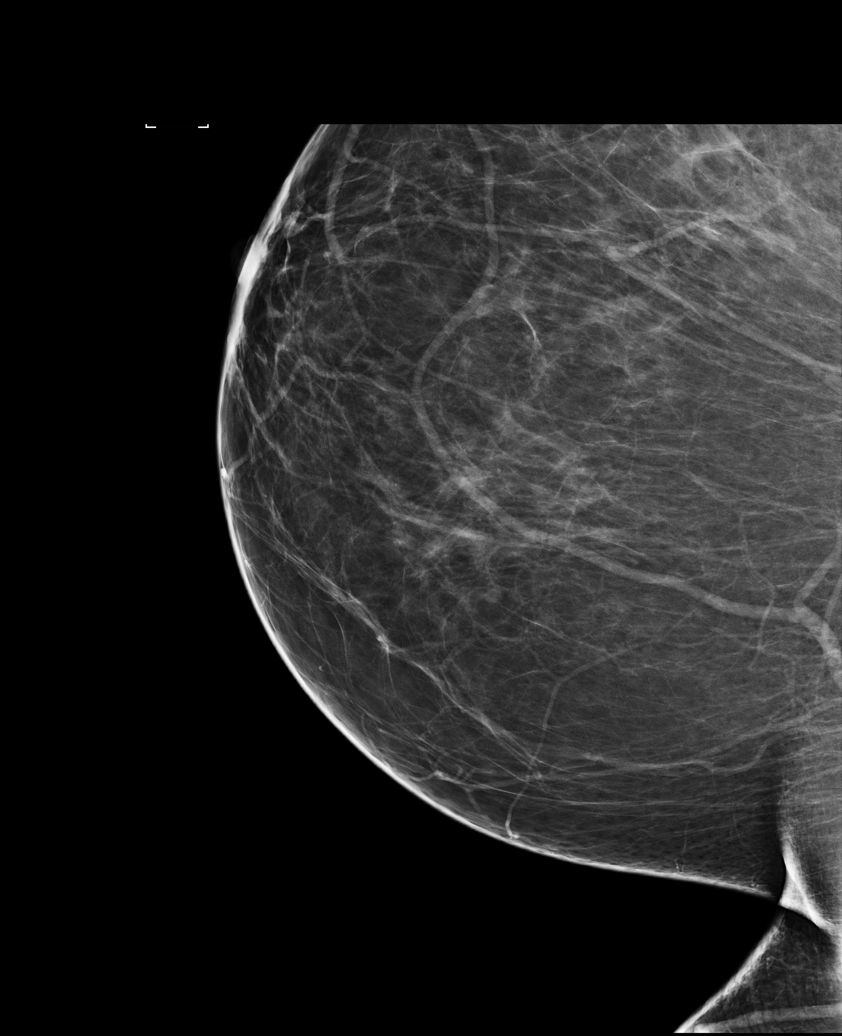

[R MLO]
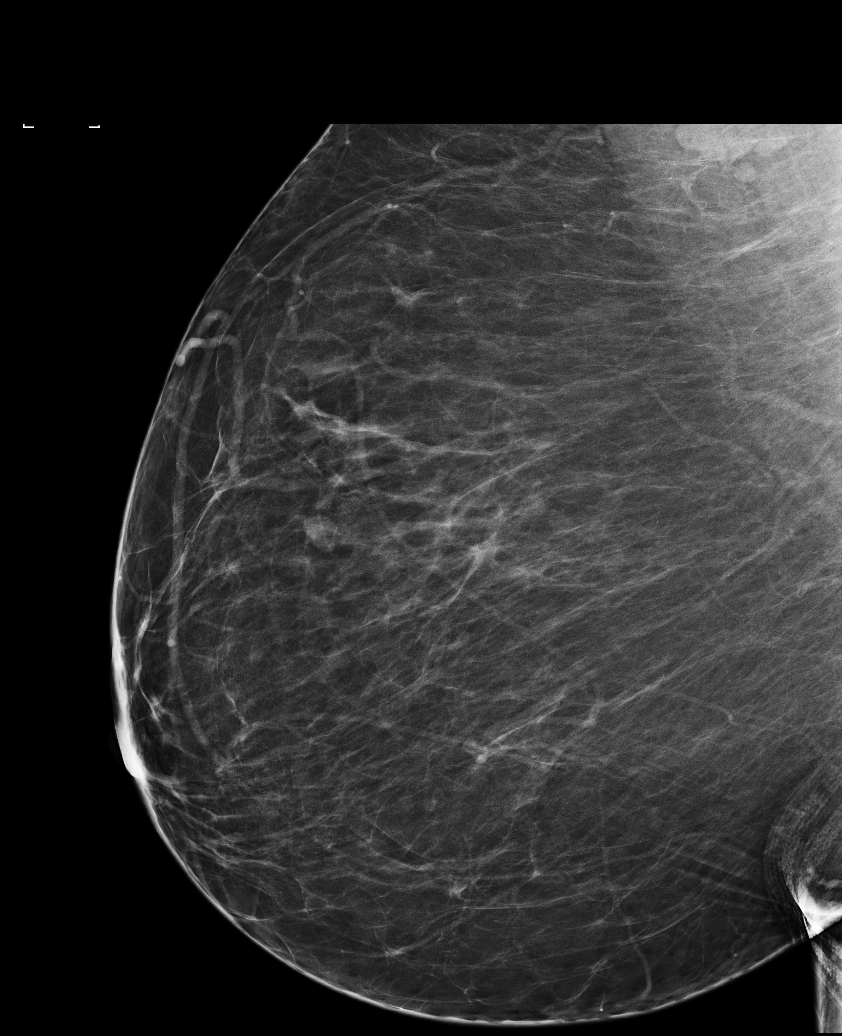

[L CC (2 of 2)]
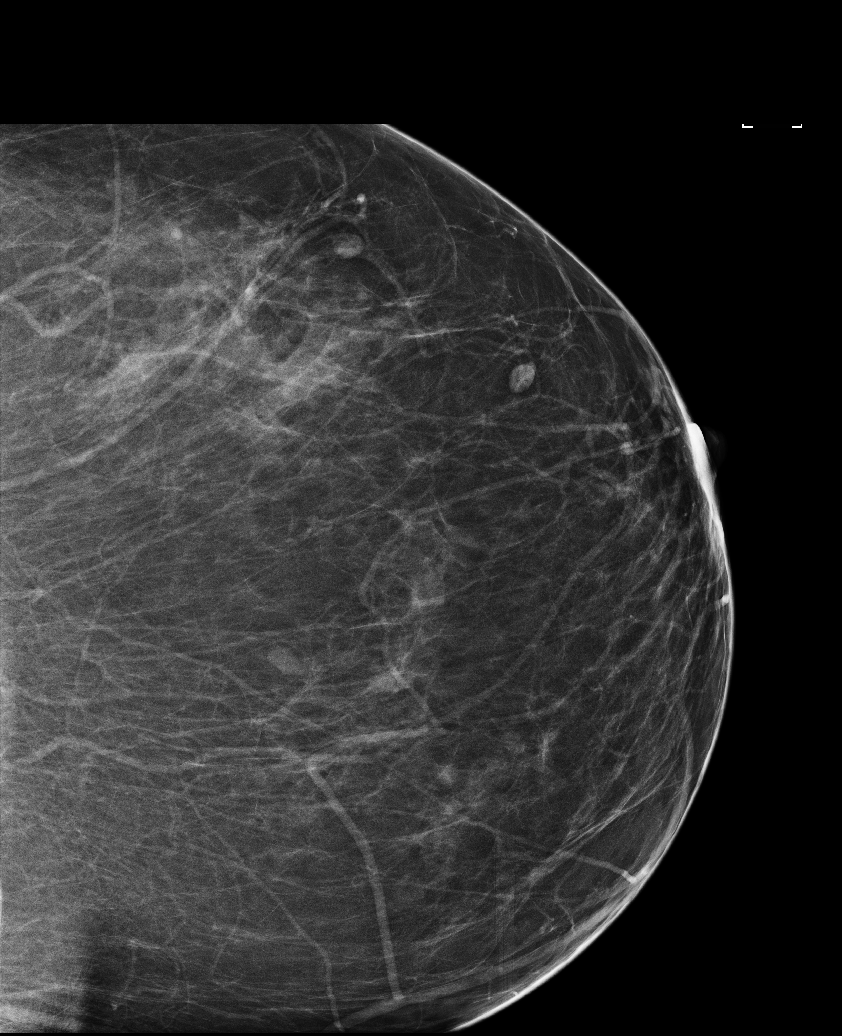

[6 of 6 positions shown; findings below may reference images not displayed]

ACR Breast Density Category b: There are scattered areas of
fibroglandular density.
FINDINGS: There are no findings suspicious for malignancy. The images were
evaluated with computer-aided detection.
IMPRESSION: No mammographic evidence of malignancy. A result letter of this
screening mammogram will be mailed directly to the patient.

RECOMMENDATION:
Screening mammogram in one year. (Code:XC-Q-XW6)

BI-RADS CATEGORY  1: Negative.

## 2021-08-20 DIAGNOSIS — Z0189 Encounter for other specified special examinations: Secondary | ICD-10-CM | POA: Diagnosis not present

## 2021-08-26 DIAGNOSIS — Z713 Dietary counseling and surveillance: Secondary | ICD-10-CM | POA: Diagnosis not present

## 2021-08-26 DIAGNOSIS — Z043 Encounter for examination and observation following other accident: Secondary | ICD-10-CM | POA: Diagnosis not present

## 2021-09-24 DIAGNOSIS — K219 Gastro-esophageal reflux disease without esophagitis: Secondary | ICD-10-CM | POA: Diagnosis not present

## 2021-09-24 DIAGNOSIS — Z8639 Personal history of other endocrine, nutritional and metabolic disease: Secondary | ICD-10-CM | POA: Diagnosis not present

## 2021-09-24 DIAGNOSIS — K912 Postsurgical malabsorption, not elsewhere classified: Secondary | ICD-10-CM | POA: Diagnosis not present

## 2021-09-24 DIAGNOSIS — Z713 Dietary counseling and surveillance: Secondary | ICD-10-CM | POA: Diagnosis not present

## 2021-09-24 DIAGNOSIS — Z9884 Bariatric surgery status: Secondary | ICD-10-CM | POA: Diagnosis not present

## 2021-09-24 DIAGNOSIS — R519 Headache, unspecified: Secondary | ICD-10-CM | POA: Diagnosis not present

## 2021-09-24 DIAGNOSIS — Z79899 Other long term (current) drug therapy: Secondary | ICD-10-CM | POA: Diagnosis not present

## 2021-10-07 ENCOUNTER — Other Ambulatory Visit: Payer: Self-pay | Admitting: Family Medicine

## 2022-02-26 NOTE — Progress Notes (Deleted)
51 y.o. [id number] Single African American female here for annual exam.    PCP:  Seabron Spates, DO   No LMP recorded.           Sexually active: {yes no:314532}  The current method of family planning is*** oral progesterone-only contraceptive/Essure.    Exercising: {yes no:314532}  {types:19826} Smoker:  no  Health Maintenance: Pap:  07-25-18 Neg:Neg HR HPV, 06-13-13 Neg:Neg HR HPV, 01-29-12 Neg  History of abnormal Pap:  no MMG:  02-22-21 Neg/BiRads1 Colonoscopy:  *** BMD:   n/a  Result  n/a TDaP:  07-25-18 Gardasil:   no HIV:07-25-18 NR Hep C:no Screening Labs:  Hb today: ***, Urine today: ***   reports that she has never smoked. She has never used smokeless tobacco. She reports that she does not drink alcohol and does not use drugs.  Past Medical History:  Diagnosis Date   Anemia    Anxiety    Asthma    Depression    GERD (gastroesophageal reflux disease)    History of anal fissures    History of gallstones    Hypertension    Migraine headache with aura    Migraines    without aura   Obesity     Past Surgical History:  Procedure Laterality Date   CHOLECYSTECTOMY     FLEXIBLE SIGMOIDOSCOPY  03/09/2012   Procedure: FLEXIBLE SIGMOIDOSCOPY;  Surgeon: Louis Meckel, MD;  Location: [facility];  Service: Endoscopy;  Laterality: N/A;   gastric sleeve surgery  03/14/2021   TUBAL LIGATION     Essure    Current Outpatient Medications  Medication Sig Dispense Refill   albuterol (VENTOLIN HFA) 108 (90 Base) MCG/ACT inhaler TAKE 2 PUFFS BY MOUTH EVERY 6 HOURS AS NEEDED FOR WHEEZE OR SHORTNESS OF BREATH 8.5 each 1   Ascorbic Acid (VITAMIN C) 1000 MG tablet Take 1,000 mg by mouth daily.     cyclobenzaprine (FLEXERIL) 10 MG tablet Take 1 tablet (10 mg total) by mouth 3 (three) times daily as needed for muscle spasms. 30 tablet 0   famotidine (PEPCID) 20 MG tablet Take 20 mg by mouth 2 (two) times daily.     fluticasone (FLONASE) 50 MCG/ACT nasal spray SPRAY 2 SPRAYS  INTO EACH NOSTRIL EVERY DAY     gabapentin (NEURONTIN) 100 MG capsule Take 100-200 mg by mouth at bedtime.     gabapentin (NEURONTIN) 300 MG capsule Take 300 mg by mouth 3 (three) times daily.     hydrocortisone (ANUSOL-HC) 25 MG suppository Place 1 suppository (25 mg total) rectally 2 (two) times daily. 24 suppository 0   LORazepam (ATIVAN) 0.5 MG tablet Take 1 tablet (0.5 mg total) by mouth 2 (two) times daily as needed for anxiety. 30 tablet 1   metoprolol tartrate (LOPRESSOR) 50 MG tablet Take 1 tablet (50 mg total) by mouth 2 (two) times daily. 180 tablet 1   Multiple Vitamin (MULTIVITAMIN) tablet Take 1 tablet by mouth daily.     norethindrone (ORTHO MICRONOR) 0.35 MG tablet Take 1 tablet (0.35 mg total) by mouth daily. 84 tablet 2   Vitamin D, Ergocalciferol, (DRISDOL) 1.25 MG (50000 UNIT) CAPS capsule Take by mouth.     No current facility-administered medications for this visit.    Family History  Problem Relation Age of Onset   Breast cancer Mother 32   Hypertension Mother    Hepatitis Father 49       Hep C   Diabetes Father    Arthritis Father  Hypertension Father    Kidney disease Father    Heart attack Father 71   Coronary artery disease Maternal Grandmother    Cataracts Maternal Grandmother    Glaucoma Maternal Grandmother    Heart disease Maternal Grandmother    Obesity Paternal Grandmother    Obesity Other     Review of Systems  Exam:   There were no vitals taken for this visit.    General appearance: alert, cooperative and appears stated age Head: normocephalic, without obvious abnormality, atraumatic Neck: no adenopathy, supple, symmetrical, trachea midline and thyroid normal to inspection and palpation Lungs: clear to auscultation bilaterally Breasts: normal appearance, no masses or tenderness, No nipple retraction or dimpling, No nipple discharge or bleeding, No axillary adenopathy Heart: regular rate and rhythm Abdomen: soft, non-tender; no masses, no  organomegaly Extremities: extremities normal, atraumatic, no cyanosis or edema Skin: skin color, texture, turgor normal. No rashes or lesions Lymph nodes: cervical, supraclavicular, and axillary nodes normal. Neurologic: grossly normal  Pelvic: External genitalia:  no lesions              No abnormal inguinal nodes palpated.              Urethra:  normal appearing urethra with no masses, tenderness or lesions              Bartholins and Skenes: normal                 Vagina: normal appearing vagina with normal color and discharge, no lesions              Cervix: no lesions              Pap taken: {yes no:314532} Bimanual Exam:  Uterus:  normal size, contour, position, consistency, mobility, non-tender              Adnexa: no mass, fullness, tenderness              Rectal exam: {yes no:314532}.  Confirms.              Anus:  normal sphincter tone, no lesions  Chaperone was present for exam:  ***  Assessment:   Well woman visit with gynecologic exam.   Plan: Mammogram screening discussed. Self breast awareness reviewed. Pap and HR HPV as above. Guidelines for Calcium, Vitamin D, regular exercise program including cardiovascular and weight bearing exercise.   Follow up annually and prn.   Additional counseling given.  {yes [id number]. _______ minutes face to face time of which over 50% was spent in counseling.    After visit summary provided.

## 2022-03-02 ENCOUNTER — Ambulatory Visit: Payer: Medicaid Other | Admitting: Obstetrics and Gynecology

## 2022-03-20 ENCOUNTER — Other Ambulatory Visit: Payer: Self-pay | Admitting: Family Medicine

## 2022-03-20 DIAGNOSIS — K644 Residual hemorrhoidal skin tags: Secondary | ICD-10-CM

## 2022-06-22 ENCOUNTER — Other Ambulatory Visit: Payer: Self-pay | Admitting: Obstetrics and Gynecology

## 2022-12-21 ENCOUNTER — Encounter: Payer: Self-pay | Admitting: Family Medicine

## 2022-12-21 ENCOUNTER — Ambulatory Visit (INDEPENDENT_AMBULATORY_CARE_PROVIDER_SITE_OTHER): Payer: BC Managed Care – PPO | Admitting: Family Medicine

## 2022-12-21 VITALS — BP 116/88 | HR 67 | Temp 98.7°F | Resp 18 | Ht 64.5 in | Wt 214.4 lb

## 2022-12-21 DIAGNOSIS — Z1159 Encounter for screening for other viral diseases: Secondary | ICD-10-CM

## 2022-12-21 DIAGNOSIS — F32A Depression, unspecified: Secondary | ICD-10-CM

## 2022-12-21 DIAGNOSIS — R1013 Epigastric pain: Secondary | ICD-10-CM

## 2022-12-21 DIAGNOSIS — Z0000 Encounter for general adult medical examination without abnormal findings: Secondary | ICD-10-CM | POA: Diagnosis not present

## 2022-12-21 DIAGNOSIS — Z1211 Encounter for screening for malignant neoplasm of colon: Secondary | ICD-10-CM

## 2022-12-21 DIAGNOSIS — E538 Deficiency of other specified B group vitamins: Secondary | ICD-10-CM

## 2022-12-21 DIAGNOSIS — I10 Essential (primary) hypertension: Secondary | ICD-10-CM

## 2022-12-21 DIAGNOSIS — F419 Anxiety disorder, unspecified: Secondary | ICD-10-CM

## 2022-12-21 DIAGNOSIS — T7840XA Allergy, unspecified, initial encounter: Secondary | ICD-10-CM

## 2022-12-21 DIAGNOSIS — J40 Bronchitis, not specified as acute or chronic: Secondary | ICD-10-CM

## 2022-12-21 MED ORDER — FLUTICASONE PROPIONATE 50 MCG/ACT NA SUSP: NASAL | 5 refills | Status: DC

## 2022-12-21 MED ORDER — FAMOTIDINE 20 MG PO TABS: 20.0000 mg | ORAL_TABLET | Freq: Two times a day (BID) | ORAL | 11 refills | Status: DC

## 2022-12-21 MED ORDER — ALBUTEROL SULFATE HFA 108 (90 BASE) MCG/ACT IN AERS: INHALATION_SPRAY | RESPIRATORY_TRACT | 1 refills | Status: DC

## 2022-12-21 NOTE — Progress Notes (Signed)
Subjective:   By signing my name below, I, Jody [name], attest that this documentation has been prepared under the direction and in the presence of Jody SchultzYvonne R Lowne Chase, DO 12/21/22   Jody [name], female    DOB: 06/01/1971, 52 y.o.   MRN: [id number]  Chief Complaint  Jody presents with   Annual Exam    Pt states fasting     HPI Jody is in today for a comprehensive physical exam. She is overall doing well.   She had a gastric sleeve procedure about 2 years ago. Currently is not following up with GI. She stays active by walking at least 30 minutes a day around her neighborhood. Since having surgery her acid reflux has worsened.   Last pap: 07/25/2018. Results were normal.   Last [facility]: [facility]   She is UTD on routine vision and dental care.   Past Medical History:  Diagnosis Date   Anemia    Anxiety    Asthma    Depression    GERD (gastroesophageal reflux disease)    History of anal fissures    History of gallstones    Hypertension    Migraine headache with aura    Migraines    without aura   Obesity     Past Surgical History:  Procedure Laterality Date   CHOLECYSTECTOMY     FLEXIBLE SIGMOIDOSCOPY  03/09/2012   Procedure: FLEXIBLE SIGMOIDOSCOPY;  Surgeon: Louis Meckelobert D Kaplan, MD;  Location: [facility];  Service: Endoscopy;  Laterality: N/A;   gastric sleeve surgery  03/14/2021   TUBAL LIGATION     Essure    Family History  Problem Relation Age of Onset   Breast cancer Mother 6350   Hypertension Mother    Hepatitis Father 2451       Hep C   Diabetes Father    Arthritis Father    Hypertension Father    Kidney disease Father    Heart attack Father [id number]   Coronary artery disease Maternal Grandmother    Cataracts Maternal Grandmother    Glaucoma Maternal Grandmother    Heart disease Maternal Grandmother    Obesity Paternal Grandmother    Obesity Other     Social History   Socioeconomic History    Marital status: Single    Spouse name: Not on file   Number of children: 3   Years of education: Not on file   Highest education level: Not on file  Occupational History   Occupation: disability    Employer: [employer]  Tobacco Use   Smoking status: Never   Smokeless tobacco: Never  Vaping Use   Vaping Use: Never used  Substance and Sexual Activity   Alcohol use: No   Drug use: No   Sexual activity: Not Currently    Partners: Male    Comment: Essure  Other Topics Concern   Not on file  Social History Narrative   Exercise-- walking 30 min daily   Social Determinants of Health   Financial Resource Strain: Not on file  Food Insecurity: Not on file  Transportation Needs: Not on file  Physical Activity: Not on file  Stress: Not on file  Social Connections: Not on file  Intimate Partner Violence: Not on file    Outpatient Medications Prior to Visit  Medication Sig Dispense Refill   Ascorbic Acid (VITAMIN C) 1000 MG tablet Take 1,000 mg by mouth daily.     cyclobenzaprine (FLEXERIL)  10 MG tablet Take 1 tablet (10 mg total) by mouth 3 (three) times daily as needed for muscle spasms. 30 tablet 0   gabapentin (NEURONTIN) 100 MG capsule Take 100-200 mg by mouth at bedtime.     gabapentin (NEURONTIN) 300 MG capsule Take 300 mg by mouth 3 (three) times daily.     hydrocortisone (ANUSOL-HC) 25 MG suppository PLACE 1 SUPPOSITORY RECTALLY 2 TIMES DAILY. 24 suppository 0   LORazepam (ATIVAN) 0.5 MG tablet Take 1 tablet (0.5 mg total) by mouth 2 (two) times daily as needed for anxiety. 30 tablet 1   metoprolol tartrate (LOPRESSOR) 50 MG tablet Take 1 tablet (50 mg total) by mouth 2 (two) times daily. 180 tablet 1   Multiple Vitamin (MULTIVITAMIN) tablet Take 1 tablet by mouth daily.     norethindrone (ORTHO MICRONOR) 0.35 MG tablet Take 1 tablet (0.35 mg total) by mouth daily. 84 tablet 2   Vitamin D, Ergocalciferol, (DRISDOL) 1.25 MG (50000 UNIT) CAPS capsule Take by mouth.      albuterol (VENTOLIN HFA) 108 (90 Base) MCG/ACT inhaler TAKE 2 PUFFS BY MOUTH EVERY 6 HOURS AS NEEDED FOR WHEEZE OR SHORTNESS OF BREATH 8.5 each 1   famotidine (PEPCID) 20 MG tablet Take 20 mg by mouth 2 (two) times daily.     fluticasone (FLONASE) 50 MCG/ACT nasal spray SPRAY 2 SPRAYS INTO EACH NOSTRIL EVERY DAY     No facility-administered medications prior to visit.    No Known Allergies  Review of Systems  Constitutional:  Negative for chills, fever and malaise/fatigue.  HENT:  Negative for congestion and hearing loss.   Eyes:  Negative for blurred vision and discharge.  Respiratory:  Negative for cough, sputum production and shortness of breath.   Cardiovascular:  Negative for chest pain, palpitations and leg swelling.  Gastrointestinal:  Negative for abdominal pain, blood in stool, constipation, diarrhea, heartburn, nausea and vomiting.  Genitourinary:  Negative for dysuria, frequency, hematuria and urgency.  Musculoskeletal:  Negative for back pain, falls and myalgias.  Skin:  Negative for rash.  Neurological:  Negative for dizziness, sensory change, loss of consciousness, weakness and headaches.  Endo/Heme/Allergies:  Negative for environmental allergies. Does not bruise/bleed easily.  Psychiatric/Behavioral:  Negative for depression and suicidal ideas. The Jody is not nervous/anxious and does not have insomnia.        Objective:    Physical Exam Vitals and nursing note reviewed.  Constitutional:      General: She is not in acute distress.    Appearance: Normal appearance.  HENT:     Head: Normocephalic and atraumatic.     Right Ear: External ear normal.     Left Ear: External ear normal.  Eyes:     Extraocular Movements: Extraocular movements intact.     Pupils: Pupils are equal, round, and reactive to light.  Cardiovascular:     Rate and Rhythm: Normal rate and regular rhythm.     Pulses: Normal pulses.     Heart sounds: Normal heart sounds. No murmur  heard.    No gallop.  Pulmonary:     Effort: Pulmonary effort is normal. No respiratory distress.     Breath sounds: Normal breath sounds. No wheezing.  Skin:    General: Skin is warm and dry.  Neurological:     General: No focal deficit present.     Mental Status: She is alert and oriented to person, place, and time.  Psychiatric:        Judgment: Judgment normal.  BP 116/88 (BP Location: Left Arm, Jody Position: Sitting, Cuff Size: Large)   Pulse 67   Temp 98.7 F (37.1 C) (Oral)   Resp 18   Ht 5' 4.5" (1.638 m)   Wt 214 lb 6.4 oz (97.3 kg)   SpO2 95%   BMI 36.23 kg/m  Wt Readings from Last 3 Encounters:  12/21/22 214 lb 6.4 oz (97.3 kg)  06/02/21 255 lb (115.7 kg)  02/25/21 293 lb (132.9 kg)       Assessment & Plan:  Preventative health care -     CBC with Differential/Platelet -     Comprehensive metabolic panel -     Lipid panel -     TSH  Bronchitis -     Albuterol Sulfate HFA; 2 puffs qid as needed  Dispense: 8.5 each; Refill: 1  Essential hypertension Assessment & Plan: Well controlled, no changes to meds. Encouraged heart healthy diet such as the DASH diet and exercise as tolerated.    Orders: -     CBC with Differential/Platelet -     Comprehensive metabolic panel -     Lipid panel -     TSH  Dyspepsia -     Famotidine; Take 1 tablet (20 mg total) by mouth 2 (two) times daily.  Dispense: 60 tablet; Refill: 11 -     Ambulatory referral to Gastroenterology  Allergy, initial encounter -     Fluticasone Propionate; SPRAY 2 SPRAYS INTO EACH NOSTRIL EVERY DAY  Dispense: 16 g; Refill: 5  Colon cancer screening -     Ambulatory referral to Gastroenterology  Need for hepatitis C screening test -     Hepatitis B surface antibody,quantitative  B12 deficiency Assessment & Plan: Check labs     Anxiety and depression Assessment & Plan: Stable    OBESITY, MORBID Assessment & Plan: S/p gastic sleeve    1. Bronchitis No active symptoms   - albuterol (VENTOLIN HFA) 108 (90 Base) MCG/ACT inhaler; 2 puffs qid as needed  Dispense: 8.5 each; Refill: 1  2. Preventative health care Ghm utd  Check labs  Health Maintenance  Topic Date Due   COVID-19 Vaccine (1) Never done   Hepatitis C Screening  Never done   COLONOSCOPY (Pts 45-34yrs Insurance coverage will need to be confirmed)  Never done   MAMMOGRAM  02/22/2022   INFLUENZA VACCINE  04/15/2023   PAP SMEAR-Modifier  05/15/2024   DTaP/Tdap/Td (3 - Td or Tdap) 07/25/2028   HIV Screening  Completed   HPV VACCINES  Aged Out    - CBC with Differential/Platelet - Comprehensive metabolic panel - Lipid panel - TSH  3. Essential hypertension Well controlled, no changes to meds. Encouraged heart healthy diet such as the DASH diet and exercise as tolerated.   - CBC with Differential/Platelet - Comprehensive metabolic panel - Lipid panel - TSH  4. Dyspepsia Symptoms worsening  - famotidine (PEPCID) 20 MG tablet; Take 1 tablet (20 mg total) by mouth 2 (two) times daily.  Dispense: 60 tablet; Refill: 11 - Ambulatory referral to Gastroenterology  5. Allergy, initial encounter Stable  - fluticasone (FLONASE) 50 MCG/ACT nasal spray; SPRAY 2 SPRAYS INTO EACH NOSTRIL EVERY DAY  Dispense: 16 g; Refill: 5  6. Colon cancer screening  - Ambulatory referral to Gastroenterology  7. Need for hepatitis C screening test  - Hepatitis B surface antibody,quantitative  8. B12 deficiency    9. Anxiety and depression    10. OBESITY, MORBID  I,Rachel [name],acting as a Neurosurgeon for [employer], DO.,have documented all relevant documentation on the behalf of Jody Schultz, DO,as directed by  Jody Schultz, DO while in the presence of Jody Schultz, DO.   I, Jody Schultz, DO, personally preformed the services described in this documentation.  All medical record entries made by the scribe were at my direction and in my presence.  I have  reviewed the chart and discharge instructions (if applicable) and agree that the record reflects my personal performance and is accurate and complete. 12/21/22   Jody Schultz, DO

## 2022-12-21 NOTE — Assessment & Plan Note (Signed)
S/p gastic sleeve

## 2022-12-21 NOTE — Assessment & Plan Note (Signed)
Stable

## 2022-12-21 NOTE — Assessment & Plan Note (Signed)
Well controlled, no changes to meds. Encouraged heart healthy diet such as the DASH diet and exercise as tolerated.  °

## 2022-12-21 NOTE — Assessment & Plan Note (Signed)
Check labs 

## 2022-12-21 NOTE — Patient Instructions (Signed)
Preventive Care 40-50 Years Old, Female Preventive care refers to lifestyle choices and visits with your health care provider that can promote health and wellness. Preventive care visits are also called wellness exams. What can I expect for my preventive care visit? Counseling Your health care provider may ask you questions about your: Medical history, including: Past medical problems. Family medical history. Pregnancy history. Current health, including: Menstrual cycle. Method of birth control. Emotional well-being. Home life and relationship well-being. Sexual activity and sexual health. Lifestyle, including: Alcohol, nicotine or tobacco, and drug use. Access to firearms. Diet, exercise, and sleep habits. Work and work environment. Sunscreen use. Safety issues such as seatbelt and bike helmet use. Physical exam Your health care provider will check your: Height and weight. These may be used to calculate your BMI (body mass index). BMI is a measurement that tells if you are at a healthy weight. Waist circumference. This measures the distance around your waistline. This measurement also tells if you are at a healthy weight and may help predict your risk of certain diseases, such as type 2 diabetes and high blood pressure. Heart rate and blood pressure. Body temperature. Skin for abnormal spots. What immunizations do I need?  Vaccines are usually given at various ages, according to a schedule. Your health care provider will recommend vaccines for you based on your age, medical history, and lifestyle or other factors, such as travel or where you work. What tests do I need? Screening Your health care provider may recommend screening tests for certain conditions. This may include: Lipid and cholesterol levels. Diabetes screening. This is done by checking your blood sugar (glucose) after you have not eaten for a while (fasting). Pelvic exam and Pap test. Hepatitis B test. Hepatitis C  test. HIV (human immunodeficiency virus) test. STI (sexually transmitted infection) testing, if you are at risk. Lung cancer screening. Colorectal cancer screening. Mammogram. Talk with your health care provider about when you should start having regular mammograms. This may depend on whether you have a family history of breast cancer. BRCA-related cancer screening. This may be done if you have a family history of breast, ovarian, tubal, or peritoneal cancers. Bone density scan. This is done to screen for osteoporosis. Talk with your health care provider about your test results, treatment options, and if necessary, the need for more tests. Follow these instructions at home: Eating and drinking  Eat a diet that includes fresh fruits and vegetables, whole grains, lean protein, and low-fat dairy products. Take vitamin and mineral supplements as recommended by your health care provider. Do not drink alcohol if: Your health care provider tells you not to drink. You are pregnant, may be pregnant, or are planning to become pregnant. If you drink alcohol: Limit how much you have to 0-1 drink a day. Know how much alcohol is in your drink. In the [place] one drink equals one 12 oz bottle of beer (355 mL), one 5 oz glass of wine (148 mL), or one 1 oz glass of hard liquor (44 mL). Lifestyle Brush your teeth every morning and night with fluoride toothpaste. Floss one time each day. Exercise for at least 30 minutes 5 or more days each week. Do not use any products that contain nicotine or tobacco. These products include cigarettes, chewing tobacco, and vaping devices, such as e-cigarettes. If you need help quitting, ask your health care provider. Do not use drugs. If you are sexually active, practice safe sex. Use a condom or other form of protection to   prevent STIs. If you do not wish to become pregnant, use a form of birth control. If you plan to become pregnant, see your health care provider for a  prepregnancy visit. Take aspirin only as told by your health care provider. Make sure that you understand how much to take and what form to take. Work with your health care provider to find out whether it is safe and beneficial for you to take aspirin daily. Find healthy ways to manage stress, such as: Meditation, yoga, or listening to music. Journaling. Talking to a trusted person. Spending time with friends and family. Minimize exposure to UV radiation to reduce your risk of skin cancer. Safety Always wear your seat belt while driving or riding in a vehicle. Do not drive: If you have been drinking alcohol. Do not ride with someone who has been drinking. When you are tired or distracted. While texting. If you have been using any mind-altering substances or drugs. Wear a helmet and other protective equipment during sports activities. If you have firearms in your house, make sure you follow all gun safety procedures. Seek help if you have been physically or sexually abused. What's next? Visit your health care provider once a year for an annual wellness visit. Ask your health care provider how often you should have your eyes and teeth checked. Stay up to date on all vaccines. This information is not intended to replace advice given to you by your health care provider. Make sure you discuss any questions you have with your health care provider. Document Revised: 02/26/2021 Document Reviewed: 02/26/2021 Elsevier Patient Education  2023 [employer]  

## 2022-12-22 ENCOUNTER — Other Ambulatory Visit: Payer: Self-pay | Admitting: Family Medicine

## 2022-12-22 DIAGNOSIS — K644 Residual hemorrhoidal skin tags: Secondary | ICD-10-CM

## 2022-12-22 LAB — CBC WITH DIFFERENTIAL/PLATELET
Basophils Absolute: 0.1 10*3/uL (ref 0.0–0.1)
Basophils Relative: 1.3 % (ref 0.0–3.0)
Eosinophils Absolute: 0.1 10*3/uL (ref 0.0–0.7)
Eosinophils Relative: 1.7 % (ref 0.0–5.0)
HCT: 40.2 % (ref 36.0–46.0)
Hemoglobin: 13.2 g/dL (ref 12.0–15.0)
Lymphocytes Relative: 69.1 % — ABNORMAL HIGH (ref 12.0–46.0)
Lymphs Abs: 4.1 10*3/uL — ABNORMAL HIGH (ref 0.7–4.0)
MCHC: 32.9 g/dL (ref 30.0–36.0)
MCV: 89.1 fl (ref 78.0–100.0)
Monocytes Absolute: 0.5 10*3/uL (ref 0.1–1.0)
Monocytes Relative: 7.7 % (ref 3.0–12.0)
Neutro Abs: 1.2 10*3/uL — ABNORMAL LOW (ref 1.4–7.7)
Neutrophils Relative %: 20.2 % — ABNORMAL LOW (ref 43.0–77.0)
Platelets: 214 10*3/uL (ref 150.0–400.0)
RBC: 4.51 Mil/uL (ref 3.87–5.11)
RDW: 13.7 % (ref 11.5–15.5)
WBC: 5.9 10*3/uL (ref 4.0–10.5)

## 2022-12-22 LAB — COMPREHENSIVE METABOLIC PANEL
ALT: 7 U/L (ref 0–35)
AST: 13 U/L (ref 0–37)
Albumin: 3.9 g/dL (ref 3.5–5.2)
Alkaline Phosphatase: 58 U/L (ref 39–117)
BUN: 9 mg/dL (ref 6–23)
CO2: 26 mEq/L (ref 19–32)
Calcium: 9.1 mg/dL (ref 8.4–10.5)
Chloride: 101 mEq/L (ref 96–112)
Creatinine, Ser: 0.72 mg/dL (ref 0.40–1.20)
GFR: 97.95 mL/min (ref >60.00)
Glucose, Bld: 72 mg/dL (ref 70–99)
Potassium: 3.9 mEq/L (ref 3.5–5.1)
Sodium: 137 mEq/L (ref 135–145)
Total Bilirubin: 0.9 mg/dL (ref 0.2–1.2)
Total Protein: 6.6 g/dL (ref 6.0–8.3)

## 2022-12-22 LAB — LIPID PANEL
Cholesterol: 157 mg/dL (ref 0–200)
HDL: 54.3 mg/dL (ref >39.00)
LDL Cholesterol: 91 mg/dL (ref 0–99)
NonHDL: 102.85
Total CHOL/HDL Ratio: 3
Triglycerides: 60 mg/dL (ref 0.0–149.0)
VLDL: 12 mg/dL (ref 0.0–40.0)

## 2022-12-22 LAB — TSH: TSH: 0.76 u[IU]/mL (ref 0.35–5.50)

## 2022-12-22 LAB — HEPATITIS B SURFACE ANTIBODY, QUANTITATIVE: Hep B S AB Quant (Post): <5 m[IU]/mL — ABNORMAL LOW (ref >=10)

## 2022-12-22 MED ORDER — HYDROCORTISONE ACETATE 25 MG RE SUPP
25.0000 mg | Freq: Two times a day (BID) | RECTAL | 0 refills | Status: DC | PRN
Start: 2022-12-22 — End: 2024-02-01

## 2023-03-11 ENCOUNTER — Encounter: Payer: Self-pay | Admitting: Gastroenterology

## 2023-06-16 ENCOUNTER — Ambulatory Visit (INDEPENDENT_AMBULATORY_CARE_PROVIDER_SITE_OTHER): Payer: Medicaid Other | Admitting: Gastroenterology

## 2023-06-16 ENCOUNTER — Encounter: Payer: Self-pay | Admitting: Gastroenterology

## 2023-06-16 VITALS — BP 110/72 | HR 78 | Ht 64.5 in | Wt 221.0 lb

## 2023-06-16 DIAGNOSIS — K219 Gastro-esophageal reflux disease without esophagitis: Secondary | ICD-10-CM

## 2023-06-16 DIAGNOSIS — Z1211 Encounter for screening for malignant neoplasm of colon: Secondary | ICD-10-CM

## 2023-06-16 DIAGNOSIS — Z903 Acquired absence of stomach [part of]: Secondary | ICD-10-CM

## 2023-06-16 MED ORDER — NA SULFATE-K SULFATE-MG SULF 17.5-3.13-1.6 GM/177ML PO SOLN: 1.0000 | Freq: Once | ORAL | 0 refills | Status: AC

## 2023-06-16 NOTE — Progress Notes (Signed)
[facility] Consult Note:  History: Jody [name] 06/16/2023  Referring provider: Zola Button, Grayling Congress, DO  Reason for consult/chief complaint: Colon Cancer Screening (Never had one, no rectal bleeding, has hemorrhoids-not hurting) and Gastroesophageal Reflux (Daily, takes famotidine BID for this)   Subjective  HPI: Jody [name] was referred to [place] by primary care for colorectal cancer screening and to discuss reflux symptoms. Today, she complains of acid reflux and heartburn.She reports undergoing a gastric sleeve surgery several years ago at [facility].  She cannot recall if she had pre-existing reflux symptoms, but certainly at some point after the sleeve surgery, she began experiencing reflux symptoms. Her symptoms include heartburn and food regurgitation but denies any accompanying dysphagia. She typically experiences these symptoms with food and water and states that she often has to sit up after consuming anything including medications. She would also occasionally wake up choking as a result of her reflux. She has lost about 100 pounds since the surgery.   She continues to complain of non-bleeding hemorrhoids. She states that she experiences them with various stool consistencies and would also protrude at times. She reports typically having a BM once daily. Her stool varies in consistency being solid at times and loose at others.   She is also interested in undergoing a screening colonoscopy. She denies any rectal bleeding or blood in stool.  No known family history of esophageal, gastric or colon cancer.  ROS:  Review of Systems  Constitutional:  Negative for appetite change and fever.  HENT:  Negative for trouble swallowing.   Respiratory:  Negative for cough and shortness of breath.   Cardiovascular:  Negative for chest pain.  Gastrointestinal:  Negative for abdominal distention, abdominal pain, anal bleeding, blood in stool, constipation, diarrhea, nausea,  rectal pain and vomiting.  Genitourinary:  Negative for dysuria.  Musculoskeletal:  Negative for back pain.  Skin:  Negative for rash.  Neurological:  Negative for weakness.  All other systems reviewed and are negative.    Past Medical History: Past Medical History:  Diagnosis Date   Anemia    Anxiety    Asthma    Depression    GERD (gastroesophageal reflux disease)    History of anal fissures    History of gallstones    Hypertension    Migraine headache with aura    Migraines    without aura   Obesity      Past Surgical History: Past Surgical History:  Procedure Laterality Date   CHOLECYSTECTOMY     ESOPHAGOGASTRODUODENOSCOPY     FLEXIBLE SIGMOIDOSCOPY  03/09/2012   Procedure: FLEXIBLE SIGMOIDOSCOPY;  Surgeon: Louis Meckel, MD;  Location: [facility];  Service: Endoscopy;  Laterality: N/A;   gastric sleeve surgery  03/14/2021   TUBAL LIGATION     [facility]   Dr. Arlyce Dice did a sigmoidoscopy with placement of 4 hemorrhoidal bands in 2013.  She recalls having had an upper endoscopy at [facility] prior to her gastric sleeve procedure  Family History: Family History  Problem Relation Age of Onset   Breast cancer Mother 74   Hypertension Mother    Hepatitis Father 33       Hep C   Diabetes Father    Arthritis Father    Hypertension Father    Kidney disease Father    Heart attack Father 36   Coronary artery disease Maternal Grandmother    Cataracts Maternal Grandmother    Glaucoma Maternal Grandmother    Heart disease Maternal Grandmother  Obesity Paternal Grandmother    Obesity Other     Social History: Social History   Socioeconomic History   Marital status: Single    Spouse name: Not on file   Number of children: 3   Years of education: Not on file   Highest education level: Not on file  Occupational History   Occupation: disability    Employer: [employer]  Tobacco Use   Smoking status: Never   Smokeless tobacco: Never  Vaping Use    Vaping status: Never Used  Substance and Sexual Activity   Alcohol use: No   Drug use: No   Sexual activity: Not Currently    Partners: Male    Comment: [facility]  Other Topics Concern   Not on file  Social History Narrative   Exercise-- walking 30 min daily   Social Determinants of Health   Financial Resource Strain: Not on file  Food Insecurity: Not on file  Transportation Needs: Not on file  Physical Activity: Not on file  Stress: Not on file  Social Connections: Not on file    Allergies: No Known Allergies  Outpatient Meds: Current Outpatient Medications  Medication Sig Dispense Refill   albuterol (VENTOLIN HFA) 108 (90 Base) MCG/ACT inhaler 2 puffs qid as needed 8.5 each 1   Ascorbic Acid (VITAMIN C) 1000 MG tablet Take 1,000 mg by mouth daily.     cyclobenzaprine (FLEXERIL) 10 MG tablet Take 1 tablet (10 mg total) by mouth 3 (three) times daily as needed for muscle spasms. 30 tablet 0   famotidine (PEPCID) 20 MG tablet Take 1 tablet (20 mg total) by mouth 2 (two) times daily. 60 tablet 11   fluticasone (FLONASE) 50 MCG/ACT nasal spray SPRAY 2 SPRAYS INTO EACH NOSTRIL EVERY DAY 16 g 5   hydrocortisone (ANUSOL-HC) 25 MG suppository Place 1 suppository (25 mg total) rectally 2 (two) times daily as needed for hemorrhoids or anal itching. 24 suppository 0   LORazepam (ATIVAN) 0.5 MG tablet Take 1 tablet (0.5 mg total) by mouth 2 (two) times daily as needed for anxiety. 30 tablet 1   metoprolol tartrate (LOPRESSOR) 50 MG tablet Take 1 tablet (50 mg total) by mouth 2 (two) times daily. 180 tablet 1   Multiple Vitamin (MULTIVITAMIN) tablet Take 1 tablet by mouth daily.     norethindrone (ORTHO MICRONOR) 0.35 MG tablet Take 1 tablet (0.35 mg total) by mouth daily. (Patient taking differently: Take 1 tablet by mouth daily. For endometriosis) 84 tablet 2   No current facility-administered medications for this visit.       ___________________________________________________________________ Objective   Exam:  BP 110/72   Pulse 78   Ht 5' 4.5" (1.638 m)   Wt 221 lb (100.2 kg)   BMI 37.35 kg/m  Wt Readings from Last 3 Encounters:  06/16/23 221 lb (100.2 kg)  12/21/22 214 lb 6.4 oz (97.3 kg)  06/02/21 255 lb (115.7 kg)   General: well-appearing   Eyes: sclera anicteric, no redness ENT: oral mucosa moist without lesions, no cervical or supraclavicular lymphadenopathy CV: RRR, no JVD, no peripheral edema Resp: clear to auscultation bilaterally, normal RR and effort noted GI: soft, no tenderness, with active bowel sounds. No guarding or palpable organomegaly noted. Skin; warm and dry, no rash or jaundice noted Neuro: awake, alert and oriented x 3. Normal gross motor function and fluent speech  Labs:  Radiologic Studies:   Assessment: Gastroesophageal reflux disease without esophagitis  Colon cancer screening  H/O gastric sleeve  Years of reflux symptoms at least since (if not before) gastric sleeve procedure.  We discussed how the symptoms can begin or worsen after a sleeve procedure that may be difficult to control with acid reducing medicines.  She denies dysphagia or odynophagia, no family history of esophageal cancer.  Average risk colorectal cancer Plan:  -EGD for further evaluation of reflux symptoms and screening colonoscopy  She was agreeable after discussion of procedure and risks.  The benefits and risks of the planned procedure were described in detail with the patient or (when appropriate) their health care proxy.  Risks were outlined as including, but not limited to, bleeding, infection, perforation, adverse medication reaction leading to cardiac or pulmonary decompensation, pancreatitis (if ERCP).  The limitation of incomplete mucosal visualization was also discussed.  No guarantees or warranties were given.  No change in medicines at this time.  Thank you for the  courtesy of this consult.  Please call me with any questions or concerns.   I,Safa M Kadhim,acting as a scribe for Charlie Pitter III, MD.,have documented all relevant documentation on the behalf of Sherrilyn Rist, MD,as directed by  Sherrilyn Rist, MD while in the presence of Sherrilyn Rist, MD.   Marvis Repress III, MD, have reviewed all documentation for this visit. The documentation on 06/16/23 for the exam, diagnosis, procedures, and orders are all accurate and complete.    CC: Referring provider noted above

## 2023-06-16 NOTE — Progress Notes (Deleted)
[facility] Consult Note:  History: Jody [name] 06/16/2023  Referring provider: Zola Button, Grayling Congress, DO  Reason for consult/chief complaint: Colon Cancer Screening (Never had one, no rectal bleeding, has hemorrhoids-not hurting) and Gastroesophageal Reflux (Daily, takes famotidine BID for this)   Subjective  HPI:  ***   ROS:  Review of Systems   Past Medical History: Past Medical History:  Diagnosis Date   Anemia    Anxiety    Asthma    Depression    GERD (gastroesophageal reflux disease)    History of anal fissures    History of gallstones    Hypertension    Migraine headache with aura    Migraines    without aura   Obesity      Past Surgical History: Past Surgical History:  Procedure Laterality Date   CHOLECYSTECTOMY     ESOPHAGOGASTRODUODENOSCOPY     FLEXIBLE SIGMOIDOSCOPY  03/09/2012   Procedure: FLEXIBLE SIGMOIDOSCOPY;  Surgeon: Louis Meckel, MD;  Location: [facility];  Service: Endoscopy;  Laterality: N/A;   gastric sleeve surgery  03/14/2021   TUBAL LIGATION     [facility]     Family History: Family History  Problem Relation Age of Onset   Breast cancer Mother 82   Hypertension Mother    Hepatitis Father 18       Hep C   Diabetes Father    Arthritis Father    Hypertension Father    Kidney disease Father    Heart attack Father 6   Coronary artery disease Maternal Grandmother    Cataracts Maternal Grandmother    Glaucoma Maternal Grandmother    Heart disease Maternal Grandmother    Obesity Paternal Grandmother    Obesity Other     Social History: Social History   Socioeconomic History   Marital status: Single    Spouse name: Not on file   Number of children: 3   Years of education: Not on file   Highest education level: Not on file  Occupational History   Occupation: disability    Employer: [employer]  Tobacco Use   Smoking status: Never   Smokeless tobacco: Never  Vaping  Use   Vaping status: Never Used  Substance and Sexual Activity   Alcohol use: No   Drug use: No   Sexual activity: Not Currently    Partners: Male    Comment: [facility]  Other Topics Concern   Not on file  Social History Narrative   Exercise-- walking 30 min daily   Social Determinants of Health   Financial Resource Strain: Not on file  Food Insecurity: Not on file  Transportation Needs: Not on file  Physical Activity: Not on file  Stress: Not on file  Social Connections: Not on file    Allergies: No Known Allergies  Outpatient Meds: Current Outpatient Medications  Medication Sig Dispense Refill   albuterol (VENTOLIN HFA) 108 (90 Base) MCG/ACT inhaler 2 puffs qid as needed 8.5 each 1   Ascorbic Acid (VITAMIN C) 1000 MG tablet Take 1,000 mg by mouth daily.     cyclobenzaprine (FLEXERIL) 10 MG tablet Take 1 tablet (10 mg total) by mouth 3 (three) times daily as needed for muscle spasms. 30 tablet 0   famotidine (PEPCID) 20 MG tablet Take 1 tablet (20 mg total) by mouth 2 (two) times daily. 60 tablet 11   fluticasone (FLONASE) 50 MCG/ACT nasal spray SPRAY 2 SPRAYS INTO EACH NOSTRIL EVERY DAY 16 g 5  hydrocortisone (ANUSOL-HC) 25 MG suppository Place 1 suppository (25 mg total) rectally 2 (two) times daily as needed for hemorrhoids or anal itching. 24 suppository 0   LORazepam (ATIVAN) 0.5 MG tablet Take 1 tablet (0.5 mg total) by mouth 2 (two) times daily as needed for anxiety. 30 tablet 1   metoprolol tartrate (LOPRESSOR) 50 MG tablet Take 1 tablet (50 mg total) by mouth 2 (two) times daily. 180 tablet 1   Multiple Vitamin (MULTIVITAMIN) tablet Take 1 tablet by mouth daily.     norethindrone (ORTHO MICRONOR) 0.35 MG tablet Take 1 tablet (0.35 mg total) by mouth daily. (Patient taking differently: Take 1 tablet by mouth daily. For endometriosis) 84 tablet 2   No current facility-administered medications for this visit.       ___________________________________________________________________ Objective   Exam:  BP 110/72   Pulse 78   Ht 5' 4.5" (1.638 m)   Wt 221 lb (100.2 kg)   BMI 37.35 kg/m  Wt Readings from Last 3 Encounters:  06/16/23 221 lb (100.2 kg)  12/21/22 214 lb 6.4 oz (97.3 kg)  06/02/21 255 lb (115.7 kg)    General: ***  Eyes: sclera anicteric, no redness ENT: oral mucosa moist without lesions, no cervical or supraclavicular lymphadenopathy CV: ***, no JVD, no peripheral edema Resp: clear to auscultation bilaterally, normal RR and effort noted GI: soft, *** tenderness, with active bowel sounds. No guarding or palpable organomegaly noted. Skin; warm and dry, no rash or jaundice noted Neuro: awake, alert and oriented x 3. Normal gross motor function and fluent speech  Labs:  ***  Radiologic Studies:  ***  Assessment: No diagnosis found.  ***  Plan:  ***  Thank you for the courtesy of this consult.  Please call me with any questions or concerns.  Charlie Pitter III  CC: Referring provider noted above

## 2023-06-16 NOTE — Patient Instructions (Signed)
_______________________________________________________  If your blood pressure at your visit was 140/90 or greater, please contact your primary care physician to follow up on this.  _______________________________________________________  If you are age 50 or older, your body mass index should be between 23-30. Your Body mass index is 37.35 kg/m. If this is out of the aforementioned range listed, please consider follow up with your Primary Care Provider.  If you are age 16 or younger, your body mass index should be between 19-25. Your Body mass index is 37.35 kg/m. If this is out of the aformentioned range listed, please consider follow up with your Primary Care Provider.   ________________________________________________________  The [facility] providers would like to encourage you to use [facility] to communicate with providers for non-urgent requests or questions.  Due to long hold times on the telephone, sending your provider a message by [facility] may be a faster and more efficient way to get a response.  Please allow 48 business hours for a response.  Please remember that this is for non-urgent requests.  _______________________________________________________  Jody [name] have been scheduled for an endoscopy and colonoscopy. Please follow the written instructions given to you at your visit today.  Please pick up your prep supplies at the pharmacy within the next 1-3 days.  If you use inhalers (even only as needed), please bring them with you on the day of your procedure.  DO NOT TAKE 7 DAYS PRIOR TO TEST- Trulicity (dulaglutide) Ozempic, Wegovy (semaglutide) Mounjaro (tirzepatide) Bydureon Bcise (exanatide extended release)  DO NOT TAKE 1 DAY PRIOR TO YOUR TEST Rybelsus (semaglutide) Adlyxin (lixisenatide) Victoza (liraglutide) Byetta (exanatide) ___________________________________________________________________________   Due to recent changes in healthcare laws, you may see the  results of your imaging and laboratory studies on MyChart before your provider has had a chance to review them.  We understand that in some cases there may be results that are confusing or concerning to you. Not all laboratory results come back in the same time frame and the provider may be waiting for multiple results in order to interpret others.  Please give [place] 48 hours in order for your provider to thoroughly review all the results before contacting the office for clarification of your results.    It was a pleasure to see you today!  Thank you for trusting me with your gastrointestinal care!

## 2023-07-11 ENCOUNTER — Other Ambulatory Visit: Payer: Self-pay | Admitting: Family Medicine

## 2023-07-11 ENCOUNTER — Encounter: Payer: Self-pay | Admitting: Certified Registered Nurse Anesthetist

## 2023-07-11 DIAGNOSIS — T7840XA Allergy, unspecified, initial encounter: Secondary | ICD-10-CM

## 2023-07-11 DIAGNOSIS — J40 Bronchitis, not specified as acute or chronic: Secondary | ICD-10-CM

## 2023-07-14 ENCOUNTER — Encounter: Payer: Medicaid Other | Admitting: Gastroenterology

## 2023-07-15 ENCOUNTER — Ambulatory Visit (AMBULATORY_SURGERY_CENTER): Payer: Medicaid Other | Admitting: Gastroenterology

## 2023-07-15 ENCOUNTER — Encounter: Payer: Self-pay | Admitting: Gastroenterology

## 2023-07-15 VITALS — BP 138/78 | HR 77 | Temp 98.2°F | Resp 16 | Ht 64.0 in | Wt 221.0 lb

## 2023-07-15 DIAGNOSIS — Z1211 Encounter for screening for malignant neoplasm of colon: Secondary | ICD-10-CM | POA: Diagnosis not present

## 2023-07-15 DIAGNOSIS — K219 Gastro-esophageal reflux disease without esophagitis: Secondary | ICD-10-CM | POA: Diagnosis not present

## 2023-07-15 MED ORDER — SODIUM CHLORIDE 0.9 % IV SOLN: 500.0000 mL | Freq: Once | INTRAVENOUS | Status: DC

## 2023-07-15 NOTE — Progress Notes (Signed)
No significant changes to clinical history since GI office visit on 06/16/23. No significant changes were identified.  The patient continues to be an appropriate candidate for the planned procedure and anesthesia.   The patient is appropriate for an endoscopic procedure in the ambulatory setting.  - Amada Jupiter, MD

## 2023-07-15 NOTE — Op Note (Signed)
[facility] Patient Name: Jody [name] Procedure Date: 07/15/2023 2:33 PM MRN: [id number] Endoscopist: Sherilyn Cooter L. Myrtie Neither , MD, [id number] Age: 52 Referring MD:  Date of Birth: Jan 03, 1971 Gender: Female Account #: [id number] Procedure:                Upper GI endoscopy Indications:              Esophageal reflux symptoms that persist despite                            appropriate therapy                           Clinical details in recent office consult note Medicines:                Monitored Anesthesia Care Procedure:                Pre-Anesthesia Assessment:                           - Prior to the procedure, a History and Physical                            was performed, and patient medications and                            allergies were reviewed. The patient's tolerance of                            previous anesthesia was also reviewed. The risks                            and benefits of the procedure and the sedation                            options and risks were discussed with the patient.                            All questions were answered, and informed consent                            was obtained. Prior Anticoagulants: The patient has                            taken no anticoagulant or antiplatelet agents. ASA                            Grade Assessment: II - A patient with mild systemic                            disease. After reviewing the risks and benefits,                            the patient was deemed in satisfactory condition to  undergo the procedure.                           After obtaining informed consent, the endoscope was                            passed under direct vision. Throughout the                            procedure, the patient's blood pressure, pulse, and                            oxygen saturations were monitored continuously. The                            Olympus Scope SN [id number] was  introduced through the                            mouth, and advanced to the second part of duodenum.                            The upper GI endoscopy was accomplished without                            difficulty. The patient tolerated the procedure                            well. Scope In: Scope Out: Findings:                 The larynx was normal.                           LA Grade A (one or more mucosal breaks less than 5                            mm, not extending between tops of 2 mucosal folds)                            esophagitis with no bleeding was found at the                            gastroesophageal junction. EGJ was also noted to be                            somewhat patulous with a widely patent Schatzki                            ring.                           The exam of the esophagus was otherwise normal.                           Evidence of prior gastric sleeve procedure. Stomach  otherwise normal. Small amount of clear fluid                            easily suctioned for good visualization. Small                            amount of bilious fluid in the distal stomach                            easily suctioned for good visualization. Pylorus                            patent with no resistance passing scope through it.                           The examined duodenum was normal. Complications:            No immediate complications. Estimated Blood Loss:     Estimated blood loss: none. Impression:               - Normal larynx.                           - LA Grade A reflux esophagitis with no bleeding.                            Widely patent Schatzki ring.                           - Normal examined duodenum.                           - No specimens collected. Recommendation:           - Patient has a contact number available for                            emergencies. The signs and symptoms of potential                             delayed complications were discussed with the                            patient. Return to normal activities tomorrow.                            Written discharge instructions were provided to the                            patient.                           - Resume previous diet.                           - Continue present medications.                           -  Follow an antireflux regimen indefinitely.                           - See the other procedure note for documentation of                            additional recommendations.                           As discussed at the recent office visit, diet and                            lifestyle antireflux measures will be at least as,                            or possibly more important than, acid reducing                            medicines in this clinical scenario with gastric                            sleeve anatomy.                           This patient could consider consulting with their                            bariatric surgeon regarding a conversion to                            Roux-en-Y gastric bypass, though that procedure                            could lead to other types of chronic digestive                            symptoms and would not guarantee control of reflux                            symptoms. Tevin Shillingford L. Myrtie Neither, MD 07/15/2023 3:18:22 PM This report has been signed electronically.

## 2023-07-15 NOTE — Progress Notes (Signed)
Report given to PACU, vss 

## 2023-07-15 NOTE — Progress Notes (Signed)
1428 Robinul 0.1 mg IV given due large amount of secretions upon assessment.  MD made aware, vss

## 2023-07-15 NOTE — Patient Instructions (Addendum)
-  Handout on hemorrhoid, esophagitis and anti reflux regimen provided -await pathology results -repeat colonoscopy in 10 years for surveillance recommended.  -Continue present medications   YOU HAD AN ENDOSCOPIC PROCEDURE TODAY AT THE [facility]:   Refer to the procedure report that was given to you for any specific questions about what was found during the examination.  If the procedure report does not answer your questions, please call your gastroenterologist to clarify.  If you requested that your care partner not be given the details of your procedure findings, then the procedure report has been included in a sealed envelope for you to review at your convenience later.  YOU SHOULD EXPECT: Some feelings of bloating in the abdomen. Passage of more gas than usual.  Walking can help get rid of the air that was put into your GI tract during the procedure and reduce the bloating. If you had a lower endoscopy (such as a colonoscopy or flexible sigmoidoscopy) you may notice spotting of blood in your stool or on the toilet paper. If you underwent a bowel prep for your procedure, you may not have a normal bowel movement for a few days.  Please Note:  You might notice some irritation and congestion in your nose or some drainage.  This is from the oxygen used during your procedure.  There is no need for concern and it should clear up in a day or so.  SYMPTOMS TO REPORT IMMEDIATELY:  Following lower endoscopy (colonoscopy or flexible sigmoidoscopy):  Excessive amounts of blood in the stool  Significant tenderness or worsening of abdominal pains  Swelling of the abdomen that is new, acute  Fever of 100F or higher  Following upper endoscopy (EGD)  Vomiting of blood or coffee ground material  New chest pain or pain under the shoulder blades  Painful or persistently difficult swallowing  New shortness of breath  Fever of 100F or higher  Black, tarry-looking stools  For urgent or emergent  issues, a gastroenterologist can be reached at any hour by calling [phone]. Do not use MyChart messaging for urgent concerns.    DIET:  We do recommend a small meal at first, but then you may proceed to your regular diet.  Drink plenty of fluids but you should avoid alcoholic beverages for 24 hours.  ACTIVITY:  You should plan to take it easy for the rest of today and you should NOT DRIVE or use heavy machinery until tomorrow (because of the sedation medicines used during the test).    FOLLOW UP: Our staff will call the number listed on your records the next business day following your procedure.  We will call around 7:15- 8:00 am to check on you and address any questions or concerns that you may have regarding the information given to you following your procedure. If we do not reach you, we will leave a message.     If any biopsies were taken you will be contacted by phone or by letter within the next 1-3 weeks.  Please call us at [phone] if you have not heard about the biopsies in 3 weeks.    SIGNATURES/CONFIDENTIALITY: You and/or your care partner have signed paperwork which will be entered into your electronic medical record.  These signatures attest to the fact that that the information above on your After Visit Summary has been reviewed and is understood.  Full responsibility of the confidentiality of this discharge information lies with you and/or your care-partner.

## 2023-07-15 NOTE — Op Note (Signed)
[facility] Patient Name: Jody [name] Procedure Date: 07/15/2023 2:33 PM MRN: [id number] Endoscopist: Sherilyn Cooter L. Myrtie Neither , MD, [id number] Age: 52 Referring MD:  Date of Birth: October 25, 1970 Gender: Female Account #: [id number] Procedure:                Colonoscopy Indications:              Screening for colorectal malignant neoplasm, This                            is the patient's first colonoscopy Medicines:                Monitored Anesthesia Care Procedure:                Pre-Anesthesia Assessment:                           - Prior to the procedure, a History and Physical                            was performed, and patient medications and                            allergies were reviewed. The patient's tolerance of                            previous anesthesia was also reviewed. The risks                            and benefits of the procedure and the sedation                            options and risks were discussed with the patient.                            All questions were answered, and informed consent                            was obtained. Prior Anticoagulants: The patient has                            taken no anticoagulant or antiplatelet agents. ASA                            Grade Assessment: II - A patient with mild systemic                            disease. After reviewing the risks and benefits,                            the patient was deemed in satisfactory condition to                            undergo the procedure.  After obtaining informed consent, the colonoscope                            was passed under direct vision. Throughout the                            procedure, the patient's blood pressure, pulse, and                            oxygen saturations were monitored continuously. The                            CF [id number] #[id number] was introduced through the anus                            and advanced to the  the cecum, identified by                            appendiceal orifice and ileocecal valve. The                            colonoscopy was performed without difficulty. The                            patient tolerated the procedure well. The quality                            of the bowel preparation was excellent. The                            ileocecal valve, appendiceal orifice, and rectum                            were photographed. Scope In: 2:42:44 PM Scope Out: 2:57:04 PM Scope Withdrawal Time: 0 hours 10 minutes 52 seconds  Total Procedure Duration: 0 hours 14 minutes 20 seconds  Findings:                 The perianal and digital rectal examinations were                            normal.                           Repeat examination of right colon under NBI                            performed.                           Internal hemorrhoids were found. (RP column the                            most prominent)  The exam was otherwise without abnormality on                            direct and retroflexion views. Complications:            No immediate complications. Estimated Blood Loss:     Estimated blood loss: none. Impression:               - Internal hemorrhoids.                           - The examination was otherwise normal on direct                            and retroflexion views.                           - No specimens collected. Recommendation:           - Patient has a contact number available for                            emergencies. The signs and symptoms of potential                            delayed complications were discussed with the                            patient. Return to normal activities tomorrow.                            Written discharge instructions were provided to the                            patient.                           - Resume previous diet.                           - Continue present  medications.                           - Repeat colonoscopy in 10 years for screening                            purposes.                           - See the other procedure note for documentation of                            additional recommendations. Loletta Harper L. Myrtie Neither, MD 07/15/2023 3:08:51 PM This report has been signed electronically.

## 2023-07-16 ENCOUNTER — Telehealth: Payer: Self-pay

## 2023-07-16 NOTE — Telephone Encounter (Signed)
Left message on answering machine. 

## 2023-07-21 ENCOUNTER — Other Ambulatory Visit: Payer: Self-pay | Admitting: Family Medicine

## 2023-07-21 DIAGNOSIS — Z1231 Encounter for screening mammogram for malignant neoplasm of breast: Secondary | ICD-10-CM

## 2023-08-20 ENCOUNTER — Ambulatory Visit
Admission: RE | Admit: 2023-08-20 | Discharge: 2023-08-20 | Disposition: A | Discharge status: Home / Self Care | Payer: Medicaid Other | Source: Ambulatory Visit

## 2023-08-20 DIAGNOSIS — Z1231 Encounter for screening mammogram for malignant neoplasm of breast: Secondary | ICD-10-CM

## 2023-08-25 ENCOUNTER — Other Ambulatory Visit: Payer: Self-pay | Admitting: Family Medicine

## 2023-08-25 DIAGNOSIS — R928 Other abnormal and inconclusive findings on diagnostic imaging of breast: Secondary | ICD-10-CM

## 2023-09-13 ENCOUNTER — Encounter: Payer: Self-pay | Admitting: *Deleted

## 2023-09-13 ENCOUNTER — Ambulatory Visit
Admission: EM | Admit: 2023-09-13 | Discharge: 2023-09-13 | Disposition: A | Discharge status: Home / Self Care | Payer: Medicaid Other | Attending: Emergency Medicine | Admitting: Emergency Medicine

## 2023-09-13 ENCOUNTER — Other Ambulatory Visit: Payer: Self-pay

## 2023-09-13 DIAGNOSIS — Z20828 Contact with and (suspected) exposure to other viral communicable diseases: Secondary | ICD-10-CM

## 2023-09-13 DIAGNOSIS — J4521 Mild intermittent asthma with (acute) exacerbation: Secondary | ICD-10-CM

## 2023-09-13 MED ORDER — PROMETHAZINE-DM 6.25-15 MG/5ML PO SYRP
5.0000 mL | ORAL_SOLUTION | Freq: Every evening | ORAL | 0 refills | Status: DC | PRN
Start: 2023-09-13 — End: 2023-10-20

## 2023-09-13 MED ORDER — BENZONATATE 100 MG PO CAPS
100.0000 mg | ORAL_CAPSULE | Freq: Three times a day (TID) | ORAL | 0 refills | Status: DC
Start: 2023-09-13 — End: 2023-11-10

## 2023-09-13 MED ORDER — PREDNISONE 10 MG (21) PO TBPK: ORAL_TABLET | Freq: Every day | ORAL | 0 refills | Status: DC

## 2023-09-13 NOTE — ED Provider Notes (Signed)
[facility]    CSN: [id number] Arrival date & time: 09/13/23  1705      History   Chief Complaint Chief Complaint  Patient presents with   Cough   Shortness of Breath    HPI Jody [name] is a 52 y.o. female.   Patient presents for evaluation of nasal congestion, rhinorrhea and a congested nonproductive cough present for 2 days.  Experiencing shortness of breath while coughing and wheezing separately.  Associated decreased hearing and ear fullness to the left side and Intermittent mild generalized headaches.  Known exposure to RSV within household into pneumonia.  Denies fever.  Poor appetite but tolerating some food and liquids.  Has attempted steam shower and honey lemon tea.  History of asthma, does have albuterol inhaler available.  Past Medical History:  Diagnosis Date   Anemia    Anxiety    Asthma    Depression    GERD (gastroesophageal reflux disease)    History of anal fissures    History of gallstones    Hypertension    Migraine headache with aura    Migraines    without aura   Obesity     Patient Active Problem List   Diagnosis Date Noted   Bronchitis 12/21/2022   Colon cancer screening 12/21/2022   Allergies 12/21/2022   Need for hepatitis C screening test 12/21/2022   Abnormal vaginal bleeding 12/30/2020   Endometriosis 08/21/2019   Low back pain with radiation 04/28/2019   Stress reaction 05/16/2017   Essential hypertension 06/18/2015   Hyperglycemia 06/18/2015   Hemorrhoids 01/31/2012   Fatigue 12/31/2011   Dyspepsia 12/31/2011   B12 deficiency 01/13/2011   Anxiety and depression 12/26/2010   Migraines 12/15/2010   Stress 12/15/2010   OBESITY, MORBID 04/04/2007    Past Surgical History:  Procedure Laterality Date   CHOLECYSTECTOMY     ESOPHAGOGASTRODUODENOSCOPY     FLEXIBLE SIGMOIDOSCOPY  03/09/2012   Procedure: FLEXIBLE SIGMOIDOSCOPY;  Surgeon: Louis Meckel, MD;  Location: [facility];  Service: Endoscopy;   Laterality: N/A;   gastric sleeve surgery  03/14/2021   TUBAL LIGATION     Essure    OB History     Gravida  3   Para  3   Term  0   Preterm  0   AB  0   Living  3      SAB  0   IAB  0   Ectopic  0   Multiple  0   Live Births  0            Home Medications    Prior to Admission medications   Medication Sig Start Date End Date Taking? Authorizing Provider  benzonatate (TESSALON) 100 MG capsule Take 1 capsule (100 mg total) by mouth every 8 (eight) hours. 09/13/23  Yes Taras Rask R, NP  predniSONE (STERAPRED UNI-PAK 21 TAB) 10 MG (21) TBPK tablet Take by mouth daily. Take 6 tabs by mouth daily  for 1 days, then 5 tabs for 1 days, then 4 tabs for 1 days, then 3 tabs for 1 days, 2 tabs for 1 days, then 1 tab by mouth daily for 1 days 09/13/23  Yes Lavette Yankovich R, NP  promethazine-dextromethorphan (PROMETHAZINE-DM) 6.25-15 MG/5ML syrup Take 5 mLs by mouth at bedtime as needed for cough. 09/13/23  Yes Tiasha Helvie R, NP  albuterol (VENTOLIN HFA) 108 (90 Base) MCG/ACT inhaler INHALE 2 PUFFS 4 TIMES A DAY AS NEEDED 07/12/23   Lowne  Irish Elders, DO  Ascorbic Acid (VITAMIN C) 1000 MG tablet Take 1,000 mg by mouth daily.    [provider]  cyclobenzaprine (FLEXERIL) 10 MG tablet Take 1 tablet (10 mg total) by mouth 3 (three) times daily as needed for muscle spasms. 12/30/20   Donato Schultz, DO  famotidine (PEPCID) 20 MG tablet Take 1 tablet (20 mg total) by mouth 2 (two) times daily. 12/21/22   Zola Button, Grayling Congress, DO  fluticasone (FLONASE) 50 MCG/ACT nasal spray SPRAY 2 SPRAYS INTO EACH NOSTRIL EVERY DAY 07/12/23   Zola Button, Grayling Congress, DO  hydrocortisone (ANUSOL-HC) 25 MG suppository Place 1 suppository (25 mg total) rectally 2 (two) times daily as needed for hemorrhoids or anal itching. 12/22/22   Donato Schultz, DO  LORazepam (ATIVAN) 0.5 MG tablet Take 1 tablet (0.5 mg total) by mouth 2 (two) times daily as needed for anxiety. 12/30/20    Donato Schultz, DO  metoprolol tartrate (LOPRESSOR) 50 MG tablet Take 1 tablet (50 mg total) by mouth 2 (two) times daily. 07/25/18   Donato Schultz, DO  Multiple Vitamin (MULTIVITAMIN) tablet Take 1 tablet by mouth daily.    [provider]  norethindrone (ORTHO MICRONOR) 0.35 MG tablet Take 1 tablet (0.35 mg total) by mouth daily. Patient taking differently: Take 1 tablet by mouth daily. For endometriosis 06/02/21   Patton Salles, MD    Family History Family History  Problem Relation Age of Onset   Breast cancer Mother 79   Hypertension Mother    Hepatitis Father 67       Hep C   Diabetes Father    Arthritis Father    Hypertension Father    Kidney disease Father    Heart attack Father 68   Coronary artery disease Maternal Grandmother    Cataracts Maternal Grandmother    Glaucoma Maternal Grandmother    Heart disease Maternal Grandmother    Colon cancer Paternal Grandmother    Obesity Paternal Grandmother    Obesity Other    Stomach cancer Neg Hx    Rectal cancer Neg Hx     Social History Social History   Tobacco Use   Smoking status: Never   Smokeless tobacco: Never  Vaping Use   Vaping status: Never Used  Substance Use Topics   Alcohol use: No   Drug use: No     Allergies   Patient has no known allergies.   Review of Systems Review of Systems  Respiratory:  Positive for cough and shortness of breath.      Physical Exam Triage Vital Signs ED Triage Vitals  Encounter Vitals Group     BP 09/13/23 1900 (!) 152/96     Systolic BP Percentile --      Diastolic BP Percentile --      Pulse Rate 09/13/23 1900 70     Resp 09/13/23 1900 18     Temp 09/13/23 1900 99.3 F (37.4 C)     Temp Source 09/13/23 1900 Oral     SpO2 09/13/23 1900 97 %     Weight --      Height --      Head Circumference --      Peak Flow --      Pain Score 09/13/23 1854 7     Pain Loc --      Pain Education --      Exclude from Growth Chart --  No data found.  Updated Vital Signs BP (!) 152/96 (BP Location: Right Arm)   Pulse 70   Temp 99.3 F (37.4 C) (Oral)   Resp 18   LMP 08/14/2023   SpO2 97%   Visual Acuity Right Eye Distance:   Left Eye Distance:   Bilateral Distance:    Right Eye Near:   Left Eye Near:    Bilateral Near:     Physical Exam Constitutional:      Appearance: Normal appearance.  HENT:     Head: Normocephalic.     Right Ear: Tympanic membrane, ear canal and external ear normal.     Left Ear: Ear canal and external ear normal. There is impacted cerumen.     Nose: Congestion present. No rhinorrhea.     Mouth/Throat:     Mouth: Mucous membranes are moist.     Pharynx: Oropharynx is clear. No oropharyngeal exudate or posterior oropharyngeal erythema.  Eyes:     Extraocular Movements: Extraocular movements intact.  Cardiovascular:     Rate and Rhythm: Normal rate and regular rhythm.     Pulses: Normal pulses.     Heart sounds: Normal heart sounds.  Pulmonary:     Effort: Pulmonary effort is normal.     Breath sounds: Normal breath sounds.  Musculoskeletal:     Cervical back: Normal range of motion and neck supple.  Neurological:     Mental Status: She is alert and oriented to person, place, and time. Mental status is at baseline.      UC Treatments / Results  Labs (all labs ordered are listed, but only abnormal results are displayed) Labs Reviewed - No data to display  EKG   Radiology No results found.  Procedures Procedures (including critical care time)  Medications Ordered in UC Medications - No data to display  Initial Impression / Assessment and Plan / UC Course  I have reviewed the triage vital signs and the nursing notes.  Pertinent labs & imaging results that were available during my care of the patient were reviewed by me and considered in my medical decision making (see chart for details).  Mild intermittent asthma with acute exacerbation, exposure to  RSV  Vitals are stable, patient is in no signs of distress nontoxic-appearing, etiology most likely viral with known exposure to viral illness is within household, discussed, virus most likely flaring asthma, prescribed prednisone, Tessalon and Promethazine DM, recommended additional supportive measures and advised follow-up if symptoms persist worsen or recur, work note given Final Clinical Impressions(s) / UC Diagnoses   Final diagnoses:  Mild intermittent asthma with (acute) exacerbation  Exposure to respiratory syncytial virus (RSV)     Discharge Instructions      Your symptoms today are most likely being caused by a virus and should steadily improve in time it can take up to 7 to 10 days before you truly start to see a turnaround however things will get better most likely has flared her asthma  Begin prednisone every morning with food as directed to open and relax airway should settle harshness of cough, shortness of breath and wheezing  May use Tessalon pill every 8 hours as needed to help calm your coughing, may use cough syrup at bedtime as needed for additional comfort    You can take Tylenol and/or Ibuprofen as needed for fever reduction and pain relief.   For cough: honey 1/2 to 1 teaspoon (you can dilute the honey in water or another fluid).  You can  also use guaifenesin and dextromethorphan for cough. You can use a humidifier for chest congestion and cough.  If you don't have a humidifier, you can sit in the bathroom with the hot shower running.      For sore throat: try warm salt water gargles, cepacol lozenges, throat spray, warm tea or water with lemon/honey, popsicles or ice, or OTC cold relief medicine for throat discomfort.   For congestion: take a daily anti-histamine like Zyrtec, Claritin, and a oral decongestant, such as pseudoephedrine.  You can also use Flonase 1-2 sprays in each nostril daily.   It is important to stay hydrated: drink plenty of fluids (water,  gatorade/powerade/pedialyte, juices, or teas) to keep your throat moisturized and help further relieve irritation/discomfort.    ED Prescriptions     Medication Sig Dispense Auth. Provider   predniSONE (STERAPRED UNI-PAK 21 TAB) 10 MG (21) TBPK tablet Take by mouth daily. Take 6 tabs by mouth daily  for 1 days, then 5 tabs for 1 days, then 4 tabs for 1 days, then 3 tabs for 1 days, 2 tabs for 1 days, then 1 tab by mouth daily for 1 days 21 tablet Caly Pellum R, NP   benzonatate (TESSALON) 100 MG capsule Take 1 capsule (100 mg total) by mouth every 8 (eight) hours. 21 capsule Zamari Vea R, NP   promethazine-dextromethorphan (PROMETHAZINE-DM) 6.25-15 MG/5ML syrup Take 5 mLs by mouth at bedtime as needed for cough. 118 mL Walker Paddack, Elita Boone, NP      PDMP not reviewed this encounter.   Valinda Hoar, NP 09/13/23 [phone]

## 2023-09-13 NOTE — Discharge Instructions (Signed)
Your symptoms today are most likely being caused by a virus and should steadily improve in time it can take up to 7 to 10 days before you truly start to see a turnaround however things will get better most likely has flared her asthma  Begin prednisone every morning with food as directed to open and relax airway should settle harshness of cough, shortness of breath and wheezing  May use Tessalon pill every 8 hours as needed to help calm your coughing, may use cough syrup at bedtime as needed for additional comfort    You can take Tylenol and/or Ibuprofen as needed for fever reduction and pain relief.   For cough: honey 1/2 to 1 teaspoon (you can dilute the honey in water or another fluid).  You can also use guaifenesin and dextromethorphan for cough. You can use a humidifier for chest congestion and cough.  If you don't have a humidifier, you can sit in the bathroom with the hot shower running.      For sore throat: try warm salt water gargles, cepacol lozenges, throat spray, warm tea or water with lemon/honey, popsicles or ice, or OTC cold relief medicine for throat discomfort.   For congestion: take a daily anti-histamine like Zyrtec, Claritin, and a oral decongestant, such as pseudoephedrine.  You can also use Flonase 1-2 sprays in each nostril daily.   It is important to stay hydrated: drink plenty of fluids (water, gatorade/powerade/pedialyte, juices, or teas) to keep your throat moisturized and help further relieve irritation/discomfort.

## 2023-09-13 NOTE — ED Triage Notes (Signed)
Pt reports having a cough with burning when she coughs. Pt is also SHOB.

## 2023-09-24 ENCOUNTER — Encounter: Payer: Medicaid Other | Admitting: Obstetrics and Gynecology

## 2023-10-01 ENCOUNTER — Ambulatory Visit
Admission: RE | Admit: 2023-10-01 | Discharge: 2023-10-01 | Disposition: A | Discharge status: Home / Self Care | Payer: Medicaid Other | Source: Ambulatory Visit | Attending: Family Medicine | Admitting: Family Medicine

## 2023-10-01 ENCOUNTER — Other Ambulatory Visit: Payer: Self-pay | Admitting: Family Medicine

## 2023-10-01 DIAGNOSIS — R928 Other abnormal and inconclusive findings on diagnostic imaging of breast: Secondary | ICD-10-CM

## 2023-10-01 DIAGNOSIS — R921 Mammographic calcification found on diagnostic imaging of breast: Secondary | ICD-10-CM

## 2023-10-08 ENCOUNTER — Ambulatory Visit
Admission: RE | Admit: 2023-10-08 | Discharge: 2023-10-08 | Disposition: A | Discharge status: Home / Self Care | Payer: Medicaid Other | Source: Ambulatory Visit | Attending: Family Medicine | Admitting: Family Medicine

## 2023-10-08 DIAGNOSIS — R921 Mammographic calcification found on diagnostic imaging of breast: Secondary | ICD-10-CM

## 2023-10-08 HISTORY — PX: BREAST BIOPSY: SHX20

## 2023-10-11 ENCOUNTER — Other Ambulatory Visit: Payer: Self-pay | Admitting: Family Medicine

## 2023-10-11 DIAGNOSIS — J40 Bronchitis, not specified as acute or chronic: Secondary | ICD-10-CM

## 2023-10-11 LAB — SURGICAL PATHOLOGY

## 2023-10-12 ENCOUNTER — Telehealth: Payer: Self-pay | Admitting: *Deleted

## 2023-10-12 NOTE — Telephone Encounter (Signed)
Spoke to patient to confirm upcoming afternoon [facility] [employer] clinic appointment on 1/8, paperwork will be sent via email  Gave location and time, also informed patient that the surgeon's office would be calling as well to get information from them similar to the packet that they will be receiving so make sure to do both.  Reminded patient that all providers will be coming to the clinic to see them HERE and if they had any questions to not hesitate to reach back out to myself or their navigators.

## 2023-10-18 ENCOUNTER — Encounter: Payer: Self-pay | Admitting: *Deleted

## 2023-10-18 DIAGNOSIS — Z170 Estrogen receptor positive status [ER+]: Secondary | ICD-10-CM | POA: Insufficient documentation

## 2023-10-18 NOTE — Progress Notes (Signed)
 Radiation Oncology         [phone]) [phone] ________________________________  Name: Jody [name]        MRN: [id number]  Date of Service: 10/20/2023 DOB: February 19, 1971  RR:Ontwz Chase, [name], DO  [name], MD     REFERRING PHYSICIAN: [name], MD   DIAGNOSIS: The encounter diagnosis was Malignant neoplasm of upper-outer quadrant of left breast in female, estrogen receptor positive (HCC).   HISTORY OF PRESENT ILLNESS: Jody [name] is a 53 y.o. female seen in the multidisciplinary breast clinic for a new diagnosis of left breast cancer. The patient was noted to have a screening detected mammogram on 08/20/2023 that showed left breast calcifications.  She had further diagnostic workup on 09/26/1723, additional diagnostic mammography showed pleomorphic calcifications measuring 5.1 cm in the upper outer quadrant of the left breast.  She underwent stereotactic biopsy on 10/08/2023 which showed benign tissue in the anterior specimen, and the coil marked specimen documented as posterior lateral showed grade 2 invasive ductal carcinoma with associated high-grade DCIS with necrosis.  Her cancer was ER/PR positive, HER2 negative with a Ki-67 of 5%.  She is seen today for treatment recommendations.    PREVIOUS RADIATION THERAPY: No   PAST MEDICAL HISTORY:  Past Medical History:  Diagnosis Date   Anemia    Anxiety    Asthma    Breast cancer (HCC)    Depression    GERD (gastroesophageal reflux disease)    History of anal fissures    History of gallstones    Hypertension    Migraine headache with aura    Migraines    without aura   Obesity        PAST SURGICAL HISTORY: Past Surgical History:  Procedure Laterality Date   BREAST BIOPSY Left 10/08/2023   MM LT BREAST BX W LOC DEV 1ST LESION IMAGE BX SPEC STEREO GUIDE 10/08/2023 [facility]   BREAST BIOPSY Left 10/08/2023   MM LT BREAST BX W LOC DEV EA AD LESION IMG BX SPEC STEREO GUIDE 10/08/2023 [facility]  MAMMOGRAPHY   CHOLECYSTECTOMY     ESOPHAGOGASTRODUODENOSCOPY     FLEXIBLE SIGMOIDOSCOPY  03/09/2012   Procedure: FLEXIBLE SIGMOIDOSCOPY;  Surgeon: Lamar JONETTA Aho, MD;  Location: [facility];  Service: Endoscopy;  Laterality: N/A;   gastric sleeve surgery  03/14/2021   TUBAL LIGATION     Essure     FAMILY HISTORY:  Family History  Problem Relation Age of Onset   Breast cancer Mother 52   Hypertension Mother    Hepatitis Father 85       Hep C   Diabetes Father    Arthritis Father    Hypertension Father    Kidney disease Father    Heart attack Father 4   Coronary artery disease Maternal Grandmother    Cataracts Maternal Grandmother    Glaucoma Maternal Grandmother    Heart disease Maternal Grandmother    Colon cancer Paternal Grandmother    Obesity Paternal Grandmother    Obesity Other    Stomach cancer Neg Hx    Rectal cancer Neg Hx      SOCIAL HISTORY:  reports that she has never smoked. She has never used smokeless tobacco. She reports that she does not drink alcohol and does not use drugs.  The patient is single and lives in [place], [place] . She is accompanied by her mother and aunt.    ALLERGIES: Patient has no known allergies.   MEDICATIONS:  Current Outpatient Medications  Medication Sig Dispense Refill   albuterol  (VENTOLIN  HFA) 108 (90 Base) MCG/ACT inhaler INHALE 2 PUFFS 4 TIMES A DAY AS NEEDED 18 each 1   benzonatate  (TESSALON ) 100 MG capsule Take 1 capsule (100 mg total) by mouth every 8 (eight) hours. 21 capsule 0   famotidine  (PEPCID ) 20 MG tablet Take 1 tablet (20 mg total) by mouth 2 (two) times daily. 60 tablet 11   fluticasone  (FLONASE ) 50 MCG/ACT nasal spray SPRAY 2 SPRAYS INTO EACH NOSTRIL EVERY DAY 48 mL 1   hydrocortisone  (ANUSOL -HC) 25 MG suppository Place 1 suppository (25 mg total) rectally 2 (two) times daily as needed for hemorrhoids or anal itching. 24 suppository 0   metoprolol  tartrate (LOPRESSOR ) 50 MG tablet Take 1 tablet (50  mg total) by mouth 2 (two) times daily. 180 tablet 1   norethindrone  (ORTHO MICRONOR ) 0.35 MG tablet Take 1 tablet (0.35 mg total) by mouth daily. (Patient taking differently: Take 1 tablet by mouth daily. For endometriosis) 84 tablet 2   No current facility-administered medications for this encounter.     REVIEW OF SYSTEMS: On review of systems, the patient reports that she is doing well overall. She feels reassured about the plan for her care. No other complaints are verbalized.      PHYSICAL EXAM:  Wt Readings from Last 3 Encounters:  10/20/23 233 lb 1.6 oz (105.7 kg)  07/15/23 221 lb (100.2 kg)  06/16/23 221 lb (100.2 kg)   Temp Readings from Last 3 Encounters:  10/20/23 98 F (36.7 C) (Temporal)  09/13/23 99.3 F (37.4 C) (Oral)  07/15/23 98.2 F (36.8 C)   BP Readings from Last 3 Encounters:  10/20/23 (!) 155/91  09/13/23 (!) 152/96  07/15/23 138/78   Pulse Readings from Last 3 Encounters:  10/20/23 61  09/13/23 70  07/15/23 77    In general this is a well appearing African-American female in no acute distress. She's alert and oriented x4 and appropriate throughout the examination. Cardiopulmonary assessment is negative for acute distress and she exhibits normal effort. Bilateral breast exam is deferred.    ECOG = 1  0 - Asymptomatic (Fully active, able to carry on all predisease activities without restriction)  1 - Symptomatic but completely ambulatory (Restricted in physically strenuous activity but ambulatory and able to carry out work of a light or sedentary nature. For example, light housework, office work)  2 - Symptomatic, <50% in bed during the day (Ambulatory and capable of all self care but unable to carry out any work activities. Up and about more than 50% of waking hours)  3 - Symptomatic, >50% in bed, but not bedbound (Capable of only limited self-care, confined to bed or chair 50% or more of waking hours)  4 - Bedbound (Completely disabled. Cannot  carry on any self-care. Totally confined to bed or chair)  5 - Death   Raylene MM, Creech RH, Tormey DC, et al. [phone]). Toxicity and response criteria of the [facility]. Am. DOROTHA Bridges. Oncol. 5 (6): 649-55    LABORATORY DATA:  Lab Results  Component Value Date   WBC 4.4 10/20/2023   HGB 13.3 10/20/2023   HCT 41.0 10/20/2023   MCV 87.4 10/20/2023   PLT 217 10/20/2023   Lab Results  Component Value Date   NA 137 10/20/2023   K 4.1 10/20/2023   CL 108 10/20/2023   CO2 25 10/20/2023   Lab Results  Component Value Date   ALT 6 10/20/2023   AST  15 10/20/2023   ALKPHOS 53 10/20/2023   BILITOT 0.9 10/20/2023      RADIOGRAPHY: MM LT BREAST BX W LOC DEV 1ST LESION IMAGE BX SPEC STEREO GUIDE Addendum Date: 10/14/2023 ADDENDUM REPORT: 10/14/2023 21:37 ADDENDUM: PATHOLOGY revealed: Site 1. Breast, LEFT, needle core biopsy, calcs, anterior lateral (x): BENIGN BREAST TISSUE, MOSTLY ADIPOSE TISSUE, NEGATIVE FOR MALIGNANCY. Pathology results are DISCORDANT with imaging findings, per Dr. Bard Moats with excision recommended. PATHOLOGY revealed: Site 2. Breast, LEFT, needle core biopsy, posterior lateral (coil): INVASIVE DUCTAL CARCINOMA, DUCTAL CARCINOMA IN SITU, SOLID AND COMEDO, HIGH NUCLEAR GRADE, WITH NECROSIS, OVERALL GRADE 2, CALCIFICATIONS PRESENT IN DCIS. Pathology results are CONCORDANT with imaging findings, per Dr. Bard Moats. Pathology results and recommendations below were discussed with patient by telephone on 10/12/2023 by Rock Hover RN. Patient reported biopsy site within normal limits with slight tenderness at the site. Post biopsy care instructions were reviewed, questions were answered and my direct phone number was provided to patient. Patient was instructed to call [facility] if any concerns or questions arise related to the biopsy. RECOMMENDATION: 1. Surgical and oncological consultation for both sites. Patient was referred to the  [facility] at [facility] with appointment on 10/20/2023 for PM clinic. Pathology results reported by Rock Hover RN on 10/12/2023. Electronically Signed   By: Bard Moats M.D.   On: 10/14/2023 21:37   Result Date: 10/14/2023 CLINICAL DATA:  Patient with indeterminate left breast calcifications, for biopsy of the anterior and posterior margins. EXAM: LEFT BREAST STEREOTACTIC CORE NEEDLE BIOPSY COMPARISON:  Previous exam(s). FINDINGS: The patient and I discussed the procedure of stereotactic-guided biopsy including benefits and alternatives. We discussed the high likelihood of a successful procedure. We discussed the risks of the procedure including infection, bleeding, tissue injury, clip migration, and inadequate sampling. Informed written consent was given. The usual time out protocol was performed immediately prior to the procedure. Site 1: Anterolateral left breast: X clip Using sterile technique and 1% Lidocaine  as local anesthetic, under stereotactic guidance, a 9 gauge vacuum assisted device was used to perform core needle biopsy of calcifications within the anterolateral left breast using a cranial approach. Specimen radiograph was performed showing calcifications. Specimens with calcifications are identified for pathology. Lesion quadrant: Upper-outer quadrant At the conclusion of the procedure, X shaped tissue marker clip was deployed into the biopsy cavity. Follow-up 2-view mammogram was performed and dictated separately. Site 2: Posterolateral left breast: Coil clip Using sterile technique and 1% Lidocaine  as local anesthetic, under stereotactic guidance, a 9 gauge vacuum assisted device was used to perform core needle biopsy of calcifications upper-outer left breast using a cranial approach. Specimen radiograph was performed showing calcifications. Specimens with calcifications are identified for pathology. Lesion quadrant: Upper-outer quadrant At  the conclusion of the procedure, coil shaped tissue marker clip was deployed into the biopsy cavity. Follow-up 2-view mammogram was performed and dictated separately. IMPRESSION: Stereotactic-guided biopsy of left breast calcifications, 2 sites. No apparent complications. Electronically Signed: By: Bard Moats M.D. On: 10/08/2023 13:49   MM LT BREAST BX W LOC DEV EA AD LESION IMG BX SPEC STEREO GUIDE Addendum Date: 10/14/2023 ADDENDUM REPORT: 10/14/2023 21:37 ADDENDUM: PATHOLOGY revealed: Site 1. Breast, LEFT, needle core biopsy, calcs, anterior lateral (x): BENIGN BREAST TISSUE, MOSTLY ADIPOSE TISSUE, NEGATIVE FOR MALIGNANCY. Pathology results are DISCORDANT with imaging findings, per Dr. Bard Moats with excision recommended. PATHOLOGY revealed: Site 2. Breast, LEFT, needle core biopsy, posterior lateral (coil):  INVASIVE DUCTAL CARCINOMA, DUCTAL CARCINOMA IN SITU, SOLID AND COMEDO, HIGH NUCLEAR GRADE, WITH NECROSIS, OVERALL GRADE 2, CALCIFICATIONS PRESENT IN DCIS. Pathology results are CONCORDANT with imaging findings, per Dr. Bard Moats. Pathology results and recommendations below were discussed with patient by telephone on 10/12/2023 by Rock Hover RN. Patient reported biopsy site within normal limits with slight tenderness at the site. Post biopsy care instructions were reviewed, questions were answered and my direct phone number was provided to patient. Patient was instructed to call [facility] if any concerns or questions arise related to the biopsy. RECOMMENDATION: 1. Surgical and oncological consultation for both sites. Patient was referred to the [facility] at [facility] with appointment on 10/20/2023 for PM clinic. Pathology results reported by Rock Hover RN on 10/12/2023. Electronically Signed   By: Bard Moats M.D.   On: 10/14/2023 21:37   Result Date: 10/14/2023 CLINICAL DATA:  Patient with indeterminate left breast  calcifications, for biopsy of the anterior and posterior margins. EXAM: LEFT BREAST STEREOTACTIC CORE NEEDLE BIOPSY COMPARISON:  Previous exam(s). FINDINGS: The patient and I discussed the procedure of stereotactic-guided biopsy including benefits and alternatives. We discussed the high likelihood of a successful procedure. We discussed the risks of the procedure including infection, bleeding, tissue injury, clip migration, and inadequate sampling. Informed written consent was given. The usual time out protocol was performed immediately prior to the procedure. Site 1: Anterolateral left breast: X clip Using sterile technique and 1% Lidocaine  as local anesthetic, under stereotactic guidance, a 9 gauge vacuum assisted device was used to perform core needle biopsy of calcifications within the anterolateral left breast using a cranial approach. Specimen radiograph was performed showing calcifications. Specimens with calcifications are identified for pathology. Lesion quadrant: Upper-outer quadrant At the conclusion of the procedure, X shaped tissue marker clip was deployed into the biopsy cavity. Follow-up 2-view mammogram was performed and dictated separately. Site 2: Posterolateral left breast: Coil clip Using sterile technique and 1% Lidocaine  as local anesthetic, under stereotactic guidance, a 9 gauge vacuum assisted device was used to perform core needle biopsy of calcifications upper-outer left breast using a cranial approach. Specimen radiograph was performed showing calcifications. Specimens with calcifications are identified for pathology. Lesion quadrant: Upper-outer quadrant At the conclusion of the procedure, coil shaped tissue marker clip was deployed into the biopsy cavity. Follow-up 2-view mammogram was performed and dictated separately. IMPRESSION: Stereotactic-guided biopsy of left breast calcifications, 2 sites. No apparent complications. Electronically Signed: By: Bard Moats M.D. On: 10/08/2023 13:49    MM CLIP PLACEMENT LEFT Result Date: 10/08/2023 CLINICAL DATA:  Status post stereo biopsy left breast calcifications, 2 sites EXAM: 3D DIAGNOSTIC LEFT MAMMOGRAM POST STEREOTACTIC BIOPSY COMPARISON:  Previous exam(s). FINDINGS: 3D Mammographic images were obtained following stereotactic guided biopsy of left breast calcifications, 2 sites. Site 1: Anterolateral left breast: X clip: 2 cm inferior to the biopsy site. Site 2: Posterolateral left breast: Coil clip: In appropriate position. IMPRESSION: Approximate 2 cm inferior migration of the X clip. Appropriate position coil clip. Final Assessment: Post Procedure Mammograms for Marker Placement Electronically Signed   By: Bard Moats M.D.   On: 10/08/2023 13:51   MM Digital Diagnostic Unilat L Result Date: 10/01/2023 CLINICAL DATA:  Recalled from screening for left breast calcifications. EXAM: DIGITAL DIAGNOSTIC UNILATERAL LEFT MAMMOGRAM WITH CAD TECHNIQUE: Left digital diagnostic mammography was performed. COMPARISON:  Previous exam(s). ACR Breast Density Category b: There are scattered areas of fibroglandular density. FINDINGS:  Magnification views of the left breast were obtained. Within the upper-outer left breast middle to posterior depth there is a 5.1 cm linearly oriented group of pleomorphic calcifications. IMPRESSION: Suspicious left breast calcifications. RECOMMENDATION: Stereotactic guided core needle biopsy (2 site) of the posterior and anterior margin of the linearly oriented pleomorphic left breast calcifications. I have discussed the findings and recommendations with the patient. If applicable, a reminder letter will be sent to the patient regarding the next appointment. BI-RADS CATEGORY  4: Suspicious. Electronically Signed   By: Bard Moats M.D.   On: 10/01/2023 14:42       IMPRESSION/PLAN: 1. At least Stage IA, [id number], grade 2, ER/PR positive, invasive ductal carcinoma of the left breast. Dr. Dewey discusses the pathology findings and  reviews the nature of early stage breast disease. The consensus from the breast conference includes MRI for extent of disease. She is likely a candidate for breast conservation with lumpectomy with sentinel node biopsy. She may also be a candidate for mastectomy if she chooses with sentinel node biopsy. Dr. Lanny is also planning Oncotype Dx score to determine a role for systemic therapy. Provided that chemotherapy is not indicated, the patient's course would then be followed by external radiotherapy to the breast  to reduce risks of local recurrence. Dr. Lanny anticipates adjuvant antiestrogen therapy to follow. We discussed the risks, benefits, short, and long term effects of radiotherapy, as well as the curative intent, and the patient is interested in proceeding. Dr. Dewey discusses the delivery and logistics of radiotherapy and anticipates a course of 4 or up to 6 1/2 weeks of radiotherapy to the left breast with deep inspiration breath hold technique if she undergoes breast conserving surgery. We will see her back a few weeks after surgery to discuss the simulation process and anticipate we starting radiotherapy about 4-6 weeks after surgery.   2. Possible genetic predisposition to malignancy. The patient is a candidate for genetic testing given her personal and family history. She will meet with our geneticist today in clinic.   In a visit lasting 60 minutes, greater than 50% of the time was spent face to face reviewing her case, as well as in preparation of, discussing, and coordinating the patient's care.  The above documentation reflects my direct findings during this shared patient visit. Please see the separate note by Dr. Dewey on this date for the remainder of the patient's plan of care.    Donald KYM Husband, [facility]    **Disclaimer: This note was dictated with voice recognition software. Similar sounding words can inadvertently be transcribed and this note may contain transcription errors  which may not have been corrected upon publication of note.**

## 2023-10-20 ENCOUNTER — Ambulatory Visit
Admission: RE | Admit: 2023-10-20 | Discharge: 2023-10-20 | Disposition: A | Discharge status: Home / Self Care | Payer: Medicaid Other | Source: Ambulatory Visit | Attending: Radiation Oncology | Admitting: Radiation Oncology

## 2023-10-20 ENCOUNTER — Encounter: Payer: Self-pay | Admitting: Genetic Counselor

## 2023-10-20 ENCOUNTER — Inpatient Hospital Stay: Payer: Medicaid Other

## 2023-10-20 ENCOUNTER — Encounter: Payer: Self-pay | Admitting: General Practice

## 2023-10-20 ENCOUNTER — Encounter: Payer: Self-pay | Admitting: *Deleted

## 2023-10-20 ENCOUNTER — Encounter: Payer: Self-pay | Admitting: Hematology

## 2023-10-20 ENCOUNTER — Inpatient Hospital Stay: Payer: Medicaid Other | Attending: Hematology | Admitting: Hematology

## 2023-10-20 ENCOUNTER — Telehealth: Payer: Self-pay | Admitting: *Deleted

## 2023-10-20 ENCOUNTER — Inpatient Hospital Stay: Payer: Medicaid Other | Admitting: Genetic Counselor

## 2023-10-20 VITALS — BP 155/91 | HR 61 | Temp 98.0°F | Resp 20 | Wt 233.1 lb

## 2023-10-20 DIAGNOSIS — Z170 Estrogen receptor positive status [ER+]: Secondary | ICD-10-CM | POA: Insufficient documentation

## 2023-10-20 DIAGNOSIS — Z79810 Long term (current) use of selective estrogen receptor modulators (SERMs): Secondary | ICD-10-CM | POA: Insufficient documentation

## 2023-10-20 DIAGNOSIS — C50412 Malignant neoplasm of upper-outer quadrant of left female breast: Secondary | ICD-10-CM

## 2023-10-20 DIAGNOSIS — I10 Essential (primary) hypertension: Secondary | ICD-10-CM | POA: Diagnosis not present

## 2023-10-20 DIAGNOSIS — Z800 Family history of malignant neoplasm of digestive organs: Secondary | ICD-10-CM | POA: Diagnosis not present

## 2023-10-20 DIAGNOSIS — K219 Gastro-esophageal reflux disease without esophagitis: Secondary | ICD-10-CM | POA: Insufficient documentation

## 2023-10-20 DIAGNOSIS — Z79899 Other long term (current) drug therapy: Secondary | ICD-10-CM | POA: Insufficient documentation

## 2023-10-20 DIAGNOSIS — Z803 Family history of malignant neoplasm of breast: Secondary | ICD-10-CM

## 2023-10-20 DIAGNOSIS — Z7952 Long term (current) use of systemic steroids: Secondary | ICD-10-CM | POA: Diagnosis not present

## 2023-10-20 DIAGNOSIS — J45909 Unspecified asthma, uncomplicated: Secondary | ICD-10-CM | POA: Diagnosis not present

## 2023-10-20 DIAGNOSIS — R232 Flushing: Secondary | ICD-10-CM | POA: Diagnosis not present

## 2023-10-20 LAB — CMP (CANCER CENTER ONLY)
ALT: 6 U/L (ref 0–44)
AST: 15 U/L (ref 15–41)
Albumin: 3.8 g/dL (ref 3.5–5.0)
Alkaline Phosphatase: 53 U/L (ref 38–126)
Anion gap: 4 — ABNORMAL LOW (ref 5–15)
BUN: 10 mg/dL (ref 6–20)
CO2: 25 mmol/L (ref 22–32)
Calcium: 8.7 mg/dL — ABNORMAL LOW (ref 8.9–10.3)
Chloride: 108 mmol/L (ref 98–111)
Creatinine: 0.79 mg/dL (ref 0.44–1.00)
GFR, Estimated: >60 mL/min (ref >60)
Glucose, Bld: 77 mg/dL (ref 70–99)
Potassium: 4.1 mmol/L (ref 3.5–5.1)
Sodium: 137 mmol/L (ref 135–145)
Total Bilirubin: 0.9 mg/dL (ref 0.0–1.2)
Total Protein: 6.9 g/dL (ref 6.5–8.1)

## 2023-10-20 LAB — CBC WITH DIFFERENTIAL (CANCER CENTER ONLY)
Abs Immature Granulocytes: 0 10*3/uL (ref 0.00–0.07)
Basophils Absolute: 0 10*3/uL (ref 0.0–0.1)
Basophils Relative: 1 %
Eosinophils Absolute: 0.1 10*3/uL (ref 0.0–0.5)
Eosinophils Relative: 3 %
HCT: 41 % (ref 36.0–46.0)
Hemoglobin: 13.3 g/dL (ref 12.0–15.0)
Immature Granulocytes: 0 %
Lymphocytes Relative: 55 %
Lymphs Abs: 2.5 10*3/uL (ref 0.7–4.0)
MCH: 28.4 pg (ref 26.0–34.0)
MCHC: 32.4 g/dL (ref 30.0–36.0)
MCV: 87.4 fL (ref 80.0–100.0)
Monocytes Absolute: 0.4 10*3/uL (ref 0.1–1.0)
Monocytes Relative: 9 %
Neutro Abs: 1.4 10*3/uL — ABNORMAL LOW (ref 1.7–7.7)
Neutrophils Relative %: 32 %
Platelet Count: 217 10*3/uL (ref 150–400)
RBC: 4.69 MIL/uL (ref 3.87–5.11)
RDW: 15.2 % (ref 11.5–15.5)
WBC Count: 4.4 10*3/uL (ref 4.0–10.5)
nRBC: 0 % (ref 0.0–0.2)

## 2023-10-20 LAB — GENETIC SCREENING ORDER

## 2023-10-20 NOTE — Telephone Encounter (Signed)
 Got a message to call patient back, when I called no answer, left my contact for her to call me when she got my message, patient does have an appointment today

## 2023-10-20 NOTE — Research (Signed)
 [employer] 2021-05 - Specimen Collection Study to Evaluate Biomarkers in Subjects with Cancer   Patient Jody [name] was identified by Dr. Lanny as a potential candidate for the above listed study.  This Clinical Research Nurse met with Jody [name], [id number], on 10/20/23 in a manner and location that ensures patient privacy to discuss participation in the above listed research study.  Jody [name], Research Coordinator, discussed the study with the pt and her family.  Patient is Accompanied by her mother and her aunt .  A copy of the informed consent document with embedded HIPAA language was provided to the patient.  Patient reads, speaks, and understands English.   Approximately 20 minutes was spent with the patient reviewing the informed consent documents.  Patient was provided the option of taking informed consent documents home to review and was encouraged to review at their convenience with their support network, including other care providers. Patient took the consent documents home to review.  The pt was informed that her study participation is completely optional.  The pt was informed that she would not get any results from her study participation.  The pt was informed that she would receive a $50 gift card upon successful blood collection if she participates in the above study.  The pt reviewed the consent form and the HIPAA form, and she was very eager to participate in the study. She did not want to have her blood collection today.  The patient agreed to come in next week and sign the consent and have her blood drawn on Friday, 10/29/23.  The Research Coordinator, Jody [name], will contact the pt on Thursday, 10/28/23 at 4 pm to confirm the patient still wants to participate in the specimen study. The pt was thanked for her interest in the study. Levon FREDRIK Sandifer RN, BSN, CCRP Clinical Research Nurse Lead 10/20/2023 4:17 PM

## 2023-10-20 NOTE — Progress Notes (Addendum)
 [facility]   Telephone:([phone] Fax:([phone]) [phone]   Clinic New Consult Note   Patient Care Team: [name], [name], DO as PCP - General Glean Stephane BROCKS, RN as Oncology Nurse Navigator [name], RN as Oncology Nurse Navigator [name], MD as Consulting Physician (Hematology) [name], MD as Consulting Physician (Radiation Oncology) [name], MD as Consulting Physician (General Surgery) 10/20/2023  CHIEF COMPLAINTS/PURPOSE OF CONSULTATION:  Newly diagnosed left breast cancer  REFERRING PHYSICIAN: Breast center  Discussed the use of AI scribe software for clinical note transcription with the patient, who gave verbal consent to proceed.  History of Present Illness   The patient is a 53 year old perimenopausal woman with a history of hypertension, anxiety, depression, endometriosis, gallbladder disease, GERD, and asthma, presents for a consultation following a recent diagnosis of invasive ductal carcinoma in the left breast.  She presents to clinic with her mother and aunt.    Her cancer was diagnosed through screening mammogram. Her mammogram on 08/20/2023 showed left breast calcifications.  She had further diagnostic workup on 09/26/1723, additional diagnostic mammography showed pleomorphic calcifications measuring 5.1 cm in the upper outer quadrant of the left breast.  She underwent stereotactic biopsy on 10/08/2023 which showed benign tissue in the anterior specimen, and the coil marked specimen documented as posterior lateral showed grade 2 invasive ductal carcinoma with associated high-grade DCIS with necrosis.  Her cancer was ER/PR positive, HER2 negative with a Ki-67 of 5%.   The patient reports no prior symptoms such as skin problems, nipple discharge, or breast pain before the biopsy. However, she has experienced soreness in the nipple after the biopsy, likely due to trauma. The patient also reports perimenopausal symptoms, including hot  flashes and mood swings. The patient has a family history of breast cancer, with her mother being a breast cancer survivor. The patient is a non-smoker and non-drinker.        MEDICAL HISTORY:  Past Medical History:  Diagnosis Date   Anemia    Anxiety    Asthma    Breast cancer (HCC)    Depression    GERD (gastroesophageal reflux disease)    History of anal fissures    History of gallstones    Hypertension    Migraine headache with aura    Migraines    without aura   Obesity     SURGICAL HISTORY: Past Surgical History:  Procedure Laterality Date   BREAST BIOPSY Left 10/08/2023   MM LT BREAST BX W LOC DEV 1ST LESION IMAGE BX SPEC STEREO GUIDE 10/08/2023 [facility]   BREAST BIOPSY Left 10/08/2023   MM LT BREAST BX W LOC DEV EA AD LESION IMG BX SPEC STEREO GUIDE 10/08/2023 [facility]   CHOLECYSTECTOMY     ESOPHAGOGASTRODUODENOSCOPY     FLEXIBLE SIGMOIDOSCOPY  03/09/2012   Procedure: FLEXIBLE SIGMOIDOSCOPY;  Surgeon: Lamar JONETTA Aho, MD;  Location: [facility];  Service: Endoscopy;  Laterality: N/A;   gastric sleeve surgery  03/14/2021   TUBAL LIGATION     [facility]    SOCIAL HISTORY: Social History   Socioeconomic History   Marital status: Single    Spouse name: Not on file   Number of children: 3   Years of education: Not on file   Highest education level: Not on file  Occupational History   Occupation: disability    Employer: [employer]  Tobacco Use   Smoking status: Never   Smokeless tobacco: Never  Vaping Use  Vaping status: Never Used  Substance and Sexual Activity   Alcohol use: No   Drug use: No   Sexual activity: Not Currently    Partners: Male    Comment: [facility]  Other Topics Concern   Not on file  Social History Narrative   Exercise-- walking 30 min daily   Social Drivers of Health   Financial Resource Strain: Not on file  Food Insecurity: Not on file  Transportation Needs: Not on file  Physical Activity: Not on  file  Stress: Not on file  Social Connections: Not on file  Intimate Partner Violence: Not on file    FAMILY HISTORY: Family History  Problem Relation Age of Onset   Breast cancer Mother 58   Hypertension Mother    Hepatitis Father 36       Hep C   Diabetes Father    Arthritis Father    Hypertension Father    Kidney disease Father    Heart attack Father 5   Coronary artery disease Maternal Grandmother    Cataracts Maternal Grandmother    Glaucoma Maternal Grandmother    Heart disease Maternal Grandmother    Colon cancer Paternal Grandmother    Obesity Paternal Grandmother    Obesity Other    Stomach cancer Neg Hx    Rectal cancer Neg Hx     ALLERGIES:  has no known allergies.  MEDICATIONS:  Current Outpatient Medications  Medication Sig Dispense Refill   albuterol  (VENTOLIN  HFA) 108 (90 Base) MCG/ACT inhaler INHALE 2 PUFFS 4 TIMES A DAY AS NEEDED 18 each 1   Ascorbic Acid (VITAMIN C) 1000 MG tablet Take 1,000 mg by mouth daily.     benzonatate  (TESSALON ) 100 MG capsule Take 1 capsule (100 mg total) by mouth every 8 (eight) hours. 21 capsule 0   cyclobenzaprine  (FLEXERIL ) 10 MG tablet Take 1 tablet (10 mg total) by mouth 3 (three) times daily as needed for muscle spasms. 30 tablet 0   famotidine  (PEPCID ) 20 MG tablet Take 1 tablet (20 mg total) by mouth 2 (two) times daily. 60 tablet 11   fluticasone  (FLONASE ) 50 MCG/ACT nasal spray SPRAY 2 SPRAYS INTO EACH NOSTRIL EVERY DAY 48 mL 1   hydrocortisone  (ANUSOL -HC) 25 MG suppository Place 1 suppository (25 mg total) rectally 2 (two) times daily as needed for hemorrhoids or anal itching. 24 suppository 0   LORazepam  (ATIVAN ) 0.5 MG tablet Take 1 tablet (0.5 mg total) by mouth 2 (two) times daily as needed for anxiety. 30 tablet 1   metoprolol  tartrate (LOPRESSOR ) 50 MG tablet Take 1 tablet (50 mg total) by mouth 2 (two) times daily. 180 tablet 1   Multiple Vitamin (MULTIVITAMIN) tablet Take 1 tablet by mouth daily.      norethindrone  (ORTHO MICRONOR ) 0.35 MG tablet Take 1 tablet (0.35 mg total) by mouth daily. (Patient taking differently: Take 1 tablet by mouth daily. For endometriosis) 84 tablet 2   predniSONE  (STERAPRED UNI-PAK 21 TAB) 10 MG (21) TBPK tablet Take by mouth daily. Take 6 tabs by mouth daily  for 1 days, then 5 tabs for 1 days, then 4 tabs for 1 days, then 3 tabs for 1 days, 2 tabs for 1 days, then 1 tab by mouth daily for 1 days 21 tablet 0   promethazine -dextromethorphan (PROMETHAZINE -DM) 6.25-15 MG/5ML syrup Take 5 mLs by mouth at bedtime as needed for cough. 118 mL 0   No current facility-administered medications for this visit.    REVIEW OF SYSTEMS:  Constitutional: Denies fevers, chills or abnormal night sweats Eyes: Denies blurriness of vision, double vision or watery eyes Ears, nose, mouth, throat, and face: Denies mucositis or sore throat Respiratory: Denies cough, dyspnea or wheezes Cardiovascular: Denies palpitation, chest discomfort or lower extremity swelling Gastrointestinal:  Denies nausea, heartburn or change in bowel habits Skin: Denies abnormal skin rashes Lymphatics: Denies new lymphadenopathy or easy bruising Neurological:Denies numbness, tingling or new weaknesses Behavioral/Psych: Mood is stable, no new changes  All other systems were reviewed with the patient and are negative.  PHYSICAL EXAMINATION: ECOG PERFORMANCE STATUS: 0 - Asymptomatic  Vitals:   10/20/23 1251  BP: (!) 155/91  Pulse: 61  Resp: 20  Temp: 98 F (36.7 C)  SpO2: 100%   Filed Weights   10/20/23 1251  Weight: 233 lb 1.6 oz (105.7 kg)    GENERAL:alert, no distress and comfortable SKIN: skin color, texture, turgor are normal, no rashes or significant lesions EYES: normal, conjunctiva are pink and non-injected, sclera clear OROPHARYNX:no exudate, no erythema and lips, buccal mucosa, and tongue normal  NECK: supple, thyroid  normal size, non-tender, without nodularity LYMPH:  no palpable  lymphadenopathy in the cervical, axillary or inguinal LUNGS: clear to auscultation and percussion with normal breathing effort HEART: regular rate & rhythm and no murmurs and no lower extremity edema ABDOMEN:abdomen soft, non-tender and normal bowel sounds Musculoskeletal:no cyanosis of digits and no clubbing  PSYCH: alert & oriented x 3 with fluent speech NEURO: no focal motor/sensory deficits  Physical Exam   NECK: No lymphadenopathy palpated. ABDOMEN: No tenderness on palpation. RECTAL: Mass palpated in the distal rectum, estimated at 2-3 cm, not visible externally, no blood observed, no tenderness elicited upon palpation.      LABORATORY DATA:  I have reviewed the data as listed    Latest Ref Rng & Units 10/20/2023   12:15 PM 12/21/2022    2:09 PM 12/30/2020    4:21 PM  CBC  WBC 4.0 - 10.5 K/uL 4.4  5.9  6.9   Hemoglobin 12.0 - 15.0 g/dL 86.6  86.7  87.8   Hematocrit 36.0 - 46.0 % 41.0  40.2  37.7   Platelets 150 - 400 K/uL 217  214.0  269.0     @cmpl @  RADIOGRAPHIC STUDIES: I have personally reviewed the radiological images as listed and agreed with the findings in the report. MM LT BREAST BX W LOC DEV 1ST LESION IMAGE BX SPEC STEREO GUIDE Addendum Date: 10/14/2023 ADDENDUM REPORT: 10/14/2023 21:37 ADDENDUM: PATHOLOGY revealed: Site 1. Breast, LEFT, needle core biopsy, calcs, anterior lateral (x): BENIGN BREAST TISSUE, MOSTLY ADIPOSE TISSUE, NEGATIVE FOR MALIGNANCY. Pathology results are DISCORDANT with imaging findings, per Dr. Bard Moats with excision recommended. PATHOLOGY revealed: Site 2. Breast, LEFT, needle core biopsy, posterior lateral (coil): INVASIVE DUCTAL CARCINOMA, DUCTAL CARCINOMA IN SITU, SOLID AND COMEDO, HIGH NUCLEAR GRADE, WITH NECROSIS, OVERALL GRADE 2, CALCIFICATIONS PRESENT IN DCIS. Pathology results are CONCORDANT with imaging findings, per Dr. Bard Moats. Pathology results and recommendations below were discussed with patient by telephone on 10/12/2023 by Rock Hover RN. Patient reported biopsy site within normal limits with slight tenderness at the site. Post biopsy care instructions were reviewed, questions were answered and my direct phone number was provided to patient. Patient was instructed to call [facility] if any concerns or questions arise related to the biopsy. RECOMMENDATION: 1. Surgical and oncological consultation for both sites. Patient was referred to the [facility] with appointment on 10/20/2023 for PM clinic. Pathology results reported by Rock Hover RN on 10/12/2023. Electronically Signed   By: Bard Moats M.D.   On: 10/14/2023 21:37   Result Date: 10/14/2023 CLINICAL DATA:  Patient with indeterminate left breast calcifications, for biopsy of the anterior and posterior margins. EXAM: LEFT BREAST STEREOTACTIC CORE NEEDLE BIOPSY COMPARISON:  Previous exam(s). FINDINGS: The patient and I discussed the procedure of stereotactic-guided biopsy including benefits and alternatives. We discussed the high likelihood of a successful procedure. We discussed the risks of the procedure including infection, bleeding, tissue injury, clip migration, and inadequate sampling. Informed written consent was given. The usual time out protocol was performed immediately prior to the procedure. Site 1: Anterolateral left breast: X clip Using sterile technique and 1% Lidocaine  as local anesthetic, under stereotactic guidance, a 9 gauge vacuum assisted device was used to perform core needle biopsy of calcifications within the anterolateral left breast using a cranial approach. Specimen radiograph was performed showing calcifications. Specimens with calcifications are identified for pathology. Lesion quadrant: Upper-outer quadrant At the conclusion of the procedure, X shaped tissue marker clip was deployed into the biopsy cavity. Follow-up 2-view mammogram was performed and dictated  separately. Site 2: Posterolateral left breast: Coil clip Using sterile technique and 1% Lidocaine  as local anesthetic, under stereotactic guidance, a 9 gauge vacuum assisted device was used to perform core needle biopsy of calcifications upper-outer left breast using a cranial approach. Specimen radiograph was performed showing calcifications. Specimens with calcifications are identified for pathology. Lesion quadrant: Upper-outer quadrant At the conclusion of the procedure, coil shaped tissue marker clip was deployed into the biopsy cavity. Follow-up 2-view mammogram was performed and dictated separately. IMPRESSION: Stereotactic-guided biopsy of left breast calcifications, 2 sites. No apparent complications. Electronically Signed: By: Bard Moats M.D. On: 10/08/2023 13:49   MM LT BREAST BX W LOC DEV EA AD LESION IMG BX SPEC STEREO GUIDE Addendum Date: 10/14/2023 ADDENDUM REPORT: 10/14/2023 21:37 ADDENDUM: PATHOLOGY revealed: Site 1. Breast, LEFT, needle core biopsy, calcs, anterior lateral (x): BENIGN BREAST TISSUE, MOSTLY ADIPOSE TISSUE, NEGATIVE FOR MALIGNANCY. Pathology results are DISCORDANT with imaging findings, per Dr. Bard Moats with excision recommended. PATHOLOGY revealed: Site 2. Breast, LEFT, needle core biopsy, posterior lateral (coil): INVASIVE DUCTAL CARCINOMA, DUCTAL CARCINOMA IN SITU, SOLID AND COMEDO, HIGH NUCLEAR GRADE, WITH NECROSIS, OVERALL GRADE 2, CALCIFICATIONS PRESENT IN DCIS. Pathology results are CONCORDANT with imaging findings, per Dr. Bard Moats. Pathology results and recommendations below were discussed with patient by telephone on 10/12/2023 by Rock Hover RN. Patient reported biopsy site within normal limits with slight tenderness at the site. Post biopsy care instructions were reviewed, questions were answered and my direct phone number was provided to patient. Patient was instructed to call [facility] if any concerns or questions arise related to the  biopsy. RECOMMENDATION: 1. Surgical and oncological consultation for both sites. Patient was referred to the [facility] at [facility] with appointment on 10/20/2023 for PM clinic. Pathology results reported by Rock Hover RN on 10/12/2023. Electronically Signed   By: Bard Moats M.D.   On: 10/14/2023 21:37   Result Date: 10/14/2023 CLINICAL DATA:  Patient with indeterminate left breast calcifications, for biopsy of the anterior and posterior margins. EXAM: LEFT BREAST STEREOTACTIC CORE NEEDLE BIOPSY COMPARISON:  Previous exam(s). FINDINGS: The patient and I discussed the procedure of stereotactic-guided biopsy including benefits and alternatives. We discussed the high likelihood of a successful procedure.  We discussed the risks of the procedure including infection, bleeding, tissue injury, clip migration, and inadequate sampling. Informed written consent was given. The usual time out protocol was performed immediately prior to the procedure. Site 1: Anterolateral left breast: X clip Using sterile technique and 1% Lidocaine  as local anesthetic, under stereotactic guidance, a 9 gauge vacuum assisted device was used to perform core needle biopsy of calcifications within the anterolateral left breast using a cranial approach. Specimen radiograph was performed showing calcifications. Specimens with calcifications are identified for pathology. Lesion quadrant: Upper-outer quadrant At the conclusion of the procedure, X shaped tissue marker clip was deployed into the biopsy cavity. Follow-up 2-view mammogram was performed and dictated separately. Site 2: Posterolateral left breast: Coil clip Using sterile technique and 1% Lidocaine  as local anesthetic, under stereotactic guidance, a 9 gauge vacuum assisted device was used to perform core needle biopsy of calcifications upper-outer left breast using a cranial approach. Specimen radiograph was performed showing  calcifications. Specimens with calcifications are identified for pathology. Lesion quadrant: Upper-outer quadrant At the conclusion of the procedure, coil shaped tissue marker clip was deployed into the biopsy cavity. Follow-up 2-view mammogram was performed and dictated separately. IMPRESSION: Stereotactic-guided biopsy of left breast calcifications, 2 sites. No apparent complications. Electronically Signed: By: Bard Moats M.D. On: 10/08/2023 13:49   MM CLIP PLACEMENT LEFT Result Date: 10/08/2023 CLINICAL DATA:  Status post stereo biopsy left breast calcifications, 2 sites EXAM: 3D DIAGNOSTIC LEFT MAMMOGRAM POST STEREOTACTIC BIOPSY COMPARISON:  Previous exam(s). FINDINGS: 3D Mammographic images were obtained following stereotactic guided biopsy of left breast calcifications, 2 sites. Site 1: Anterolateral left breast: X clip: 2 cm inferior to the biopsy site. Site 2: Posterolateral left breast: Coil clip: In appropriate position. IMPRESSION: Approximate 2 cm inferior migration of the X clip. Appropriate position coil clip. Final Assessment: Post Procedure Mammograms for Marker Placement Electronically Signed   By: Bard Moats M.D.   On: 10/08/2023 13:51   MM Digital Diagnostic Unilat L Result Date: 10/01/2023 CLINICAL DATA:  Recalled from screening for left breast calcifications. EXAM: DIGITAL DIAGNOSTIC UNILATERAL LEFT MAMMOGRAM WITH CAD TECHNIQUE: Left digital diagnostic mammography was performed. COMPARISON:  Previous exam(s). ACR Breast Density Category b: There are scattered areas of fibroglandular density. FINDINGS: Magnification views of the left breast were obtained. Within the upper-outer left breast middle to posterior depth there is a 5.1 cm linearly oriented group of pleomorphic calcifications. IMPRESSION: Suspicious left breast calcifications. RECOMMENDATION: Stereotactic guided core needle biopsy (2 site) of the posterior and anterior margin of the linearly oriented pleomorphic left breast  calcifications. I have discussed the findings and recommendations with the patient. If applicable, a reminder letter will be sent to the patient regarding the next appointment. BI-RADS CATEGORY  4: Suspicious. Electronically Signed   By: Bard Moats M.D.   On: 10/01/2023 14:42    ASSESSMENT & PLAN:  53 year old perimenopausal woman presented with screening discovered left breast cancer  Malignant neoplasm of upper-outer quadrant of left breast, invasive ductal carcinoma,cT3N0M0, stage IIA, ER+/PR+/HER2-, G2 --We discussed her imaging findings and the biopsy results in great details. -due to her large size of breast, she is to a candidate for lumpectomy and sentinel lymph node biopsy. She is agreeable with that. She was seen by Dr. Vernetta today and likely will proceed with surgery soon.  -Due to her dense breast tissue and large area of calcification, will obtain a breast MRI for further evaluation. -I recommend a Oncotype Dx test on the surgical sample and  we'll make a decision about adjuvant chemotherapy based on the Oncotype result. Written material of this test was given to her. She is young and fit, would be a good candidate for chemotherapy if her Oncotype recurrence score is high. -Giving the strong ER and PR expression in her perimenopausal status, I recommend adjuvant endocrine therapy with tamoxifen , possible switch to aromatase inhibitor when she became postmenopausal for a total of 5-10 years to reduce the risk of cancer recurrence. Potential benefits and side effects were discussed with patient and she is interested. -She was also seen by radiation oncologist Dr. Dewey today.  Adjuvant radiation is recommended after lumpectomy. -We also discussed the breast cancer surveillance after her surgery. She will continue annual screening mammogram, self exam, and a routine office visit with lab and exam with us . -I encouraged her to have healthy diet and exercise regularly.    Hypertension Hypertension managed with metoprolol . Blood pressure control essential to reduce cardiovascular risk. - Continue metoprolol  as prescribed  Asthma Asthma managed with inhaler as needed. No recent exacerbations reported. - Continue inhaler as needed  Gastroesophageal Reflux Disease (GERD) GERD managed with Pepcid . No recent exacerbations reported. - Continue Pepcid  as prescribed  Plan -Breast MRI with and without contrast -She will proceed with lumpectomy and sentinel lymph node biopsy show, and breast reduction -Oncotype on her surgical sample -I will see her after radiation, or sooner if Oncotype shows high risk disease. --Genetic referral -Will refer for clinical trial   Orders Placed This Encounter  Procedures   MR BREAST BILATERAL W WO CONTRAST INC CAD    Standing Status:   Future    Expected Date:   10/21/2023    Expiration Date:   10/19/2024    If indicated for the ordered procedure, I authorize the administration of contrast media per Radiology protocol:   Yes    What is the patient's sedation requirement?:   No Sedation    Does the patient have a pacemaker or implanted devices?:   No    Radiology Contrast Protocol - do NOT remove file path:   [email or web address]    Preferred imaging location?:   [facility] (table limit-550lbs)   Ambulatory referral to Physical Therapy    Referral Priority:   Routine    Referral Type:   Physical Medicine    Referral Reason:   Specialty Services Required    Requested Specialty:   Physical Therapy    Number of Visits Requested:   1    All questions were answered. The patient knows to call the clinic with any problems, questions or concerns. I spent 35  minutes counseling the patient face to face. The total time spent in the appointment was 45 minutes and more than 50% was on counseling.     Onita Mattock, MD 10/20/2023 1:57 PM

## 2023-10-20 NOTE — Progress Notes (Signed)
 REFERRING PROVIDER: [name], MD [address] [place],  [place] [address]  PRIMARY PROVIDER:  [name], [name], DO  PRIMARY REASON FOR VISIT:  1. Malignant neoplasm of upper-outer quadrant of left breast in female, estrogen receptor positive (HCC)   2. Family history of breast cancer     HISTORY OF PRESENT ILLNESS:   Ms. Neitzke, a 53 y.o. female, was seen for a [place] cancer genetics consultation during the breast multidisciplinary clinic at the request of Dr. Lanny  due to a personal and family history of breast cancer.  Ms. Borrayo presents to clinic today to discuss the possibility of a hereditary predisposition to cancer, to discuss genetic testing, and to further clarify her future cancer risks, as well as potential cancer risks for family members.   In January 2025, at the age of 14, Ms. Maxson was diagnosed with invasive ductal carcinoma of the left breast (ER+/PR+/HER2-). The treatment plan is pending.   CANCER HISTORY:  Oncology History  Malignant neoplasm of upper-outer quadrant of left breast in female, estrogen receptor positive (HCC)  10/08/2023 Cancer Staging   Staging form: Breast, AJCC 8th Edition - Clinical stage from 10/08/2023: Stage IIA (cT3, cN0, cM0, G2, ER+, PR+, HER2-) - Signed by [name], MD on 10/19/2023 Stage prefix: Initial diagnosis Histologic grading system: 3 grade system   10/18/2023 Initial Diagnosis   Malignant neoplasm of upper-outer quadrant of left breast in female, estrogen receptor positive (HCC)     Past Medical History:  Diagnosis Date   Anemia    Anxiety    Asthma    Breast cancer (HCC)    Depression    GERD (gastroesophageal reflux disease)    History of anal fissures    History of gallstones    Hypertension    Migraine headache with aura    Migraines    without aura   Obesity     Past Surgical History:  Procedure Laterality Date   BREAST BIOPSY Left 10/08/2023   MM LT BREAST BX W LOC DEV 1ST LESION IMAGE BX  SPEC STEREO GUIDE 10/08/2023 [facility]   BREAST BIOPSY Left 10/08/2023   MM LT BREAST BX W LOC DEV EA AD LESION IMG BX SPEC STEREO GUIDE 10/08/2023 [facility]   CHOLECYSTECTOMY     ESOPHAGOGASTRODUODENOSCOPY     FLEXIBLE SIGMOIDOSCOPY  03/09/2012   Procedure: FLEXIBLE SIGMOIDOSCOPY;  Surgeon: Lamar JONETTA Aho, MD;  Location: [facility];  Service: Endoscopy;  Laterality: N/A;   gastric sleeve surgery  03/14/2021   TUBAL LIGATION     Essure     FAMILY HISTORY:  We obtained a detailed, 4-generation family history.  Significant diagnoses are listed below: Family History  Problem Relation Age of Onset   Breast cancer Mother 59       no genetic testing   Breast cancer Maternal Aunt        dx late 12s; mother's mat half sister   Cancer Maternal Uncle        x2 mat uncles; unk type; dx >50   Colon cancer Paternal Grandmother        dx >50   Cancer Cousin        unk type; dx 18s; mat female cousin     Ms. Schnurr is unaware of previous family history of genetic testing for hereditary cancer risks. There is no reported Ashkenazi Jewish ancestry. There is no known consanguinity.  GENETIC COUNSELING ASSESSMENT: Ms. Winger is a 53 y.o. female with a  personal and family history of cancer which is somewhat suggestive of a hereditary cancer syndrome and predisposition to cancer given her age of diagnosis and the presence of breast cancer in several maternal relatives. We, therefore, discussed and recommended the following at today's visit.   DISCUSSION: We discussed that 5 - 10% of cancer is hereditary, with most cases of hereditary breast cancer associated with mutations in BRCA1/2.  There are other genes that can be associated with hereditary breast and other cancer syndromes.  Type of cancer risk and level of risk are gene-specific. We discussed that testing is beneficial for several reasons including knowing how to follow individuals for future cancer risks, identifying whether  potential treatment/surgery options would be beneficial, and understanding if other family members could be at risk for cancer and allowing them to undergo genetic testing.   We reviewed the characteristics, features and inheritance patterns of hereditary cancer syndromes. We also discussed genetic testing, including the appropriate family members to test, the process of testing, insurance coverage and turn-around-time for results. We discussed the implications of a negative, positive and/or variant of uncertain significant result. In order to get genetic test results in a timely manner so that Ms. Heinrich can use these genetic test results for surgical decisions, we recommended Ms. Nolden pursue genetic testing for the [facility] BRCAPlus Panel. The BRCAPlus gene panel offered by [facility] and includes sequencing and rearrangement analysis for the following 13 genes: ATM, BARD1, BRCA1, BRCA2, CDH1, CHEK2, NF1, PALB2, PTEN, RAD51C, RAD51D, STK11 and TP53. Once complete, we recommend Ms. Melick pursue reflex genetic testing to a more comprehensive gene panel.   The [facility] CancerNext+RNAinsight Panel includes sequencing, rearrangement analysis, and RNA analysis for the following 39 genes: APC, ATM, BAP1, BARD1, BMPR1A, BRCA1, BRCA2, BRIP1, CDH1, CDKN2A, CHEK2, FH, FLCN, MET, MLH1, MSH2, MSH6, MUTYH, NF1, NTHL1, PALB2, PMS2, PTEN, RAD51C, RAD51D, SMAD4, STK11, TP53, TSC1, TSC2, and VHL (sequencing and deletion/duplication); AXIN2, HOXB13, MBD4, MSH3, POLD1 and POLE (sequencing only); EPCAM and GREM1 (deletion/duplication only).  Based on Ms. Szwed's personal history of breast cancer at age 57, she meets medical criteria for genetic testing. If her out of pocket cost for testing is over $100, the laboratory should contact them to discuss self-pay prices, patient pay assistance programs, if applicable, and other billing options.   PLAN: After considering the risks, benefits, and limitations, Ms. Skilton  provided informed consent to pursue genetic testing and the blood sample was sent to [facility] for analysis of the BRCAPlus and CancerNext +RNAinsight Panel. Results should be available within approximately 1-2 weeks' time, at which point they will be disclosed by telephone to Ms. Demarco, as will any additional recommendations warranted by these results. Ms. Morford will receive a summary of her genetic counseling visit and a copy of her results once available. This information will also be available in Epic.   Based on Ms. Raburn's family history, we recommended her mother, who was diagnosed with breast cancer at age 89, have genetic counseling and testing.   Ms. Purdy questions were answered to her satisfaction today. Our contact information was provided should additional questions or concerns arise. Thank you for the referral and allowing us  to share in the care of your patient.   Nusayba Cadenas M. Nydia, MS, [facility] Genetic Counselor [email or web address] (P) [phone]    35 minutes were spent on the date of the encounter in service to the patient including preparation, face-to-face consultation, documentation and care coordination.  The patient was accompanied by her mother  and maternal aunt.  Dr. Lanny was available to discuss this case as needed.   _______________________________________________________________________ For Office Staff:  Number of people involved in session: 3 Was an Intern/ student involved with case: yes; Chemical Engineer observed session

## 2023-10-22 NOTE — Progress Notes (Signed)
 [facility] Multidisciplinary Clinic Spiritual Care Note  Met with Jody [name], her mom Jody [name], and aunt Jody [name] in Breast Multidisciplinary Clinic to introduce Support Center team/resources.  She completed SDOH screening; results follow below.    SDOH Screenings   Food Insecurity: No Food Insecurity (10/22/2023)  Housing: High Risk (10/22/2023)  Transportation Needs: Unmet Transportation Needs (10/22/2023)  Utilities: At Risk (10/22/2023)  Depression (PHQ2-9): Low Risk  (12/21/2022)  Tobacco Use: Low Risk  (10/20/2023)   Received from [facility] and patient discussed common feelings and emotions when being diagnosed with cancer, and the importance of support during treatment.  Chaplain informed patient of the support team and support services at [facility].  Chaplain provided contact information and encouraged patient to call with any questions or concerns.  Jody [name] describes herself as a homebody and notes that she tends to keep private, definitely wanting to attend clinic before sharing anything about her diagnosis more widely.  She is interested in support groups in order to receive input from multiple perspectives.  Follow up needed: Yes.  Jody [name] welcomes a Spiritual Care follow-up call in ca two weeks. Will also refer to Social Work regarding SDOH screening results.   [address] [place], [place], [facility] Pager [phone] Voicemail [phone]

## 2023-10-26 ENCOUNTER — Inpatient Hospital Stay: Payer: Medicaid Other | Admitting: Licensed Clinical Social Worker

## 2023-10-26 ENCOUNTER — Telehealth: Payer: Self-pay | Admitting: *Deleted

## 2023-10-26 DIAGNOSIS — C50412 Malignant neoplasm of upper-outer quadrant of left female breast: Secondary | ICD-10-CM

## 2023-10-26 NOTE — Telephone Encounter (Signed)
Left vm regarding [facility] from 10/20/23. Contact information provided for questions or needs.

## 2023-10-26 NOTE — Progress Notes (Signed)
[facility] Clinical Social Work  Initial Assessment   Jody [name] is a 53 y.o. year old female contacted by phone. Clinical Social Work was referred by medical provider for assessment of psychosocial needs.   SDOH (Social Determinants of Health) assessments performed: Yes   SDOH Screenings   Food Insecurity: No Food Insecurity (10/22/2023)  Housing: High Risk (10/22/2023)  Transportation Needs: Unmet Transportation Needs (10/22/2023)  Utilities: At Risk (10/22/2023)  Depression (PHQ2-9): Low Risk  (10/22/2023)  Tobacco Use: Low Risk  (10/20/2023)   Received from [facility]     Distress Screen completed: No     No data to display            Family/Social Information:  Housing Arrangement: patient lives with her 55 and 69 year old children. Family members/support persons in your life? Pt's mother and aunt will be her primary source of support.  Pt's support will be limited. 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  Income source: Employment Financial concerns: Yes, current concerns Type of concern: Utilities and Rent/ mortgage Food access concerns: no Religious or spiritual practice: Not known Advanced directives: No Services Currently in place:  none  Coping/ Adjustment to diagnosis: Patient understands treatment plan and what happens next? yes Concerns about diagnosis and/or treatment: Losing my job and/or losing income, Overwhelmed by information, Afraid of cancer, How I will pay for the services I need, and How will I care for myself Patient reported stressors: Finances, Anxiety/ nervousness, and Adjusting to my illness Hopes and/or priorities: pt's priority is to start treatment w/ the hope of positive results Patient enjoys  not discussed Current coping skills/ strengths: Capable of independent living , Motivation for treatment/growth , and Physical Health      SUMMARY: Current SDOH Barriers:  Financial constraints related to limited income and loss of that income during treatment and Limited social support  Clinical Social Work Clinical Goal(s):  Explore community resource options for unmet needs related to:  Financial Strain   Interventions: Discussed common feeling and emotions when being diagnosed with cancer, and the importance of support during treatment Informed patient of the support team roles and support services at [facility] Provided CSW contact information and encouraged patient to call with any questions or concerns Referred patient to mentorship program.  Provided pt w/ links to breast cancer grants.  Provided pt w/ information regarding talking to children about diagnosis.  Informed of [facility] and will refer to financial counselor once radiation begins.   Follow Up Plan: Patient will contact CSW with any support or resource needs Patient verbalizes understanding of plan: Yes    Rachel Moulds, LCSW Clinical Social Worker [facility]  Patient is participating in a Managed Medicaid Plan:  Yes

## 2023-10-27 ENCOUNTER — Encounter: Payer: Self-pay | Admitting: Surgery

## 2023-10-29 ENCOUNTER — Other Ambulatory Visit: Payer: Medicaid Other

## 2023-10-29 ENCOUNTER — Inpatient Hospital Stay: Payer: Medicaid Other | Admitting: *Deleted

## 2023-10-29 ENCOUNTER — Encounter: Payer: Medicaid Other | Admitting: *Deleted

## 2023-10-29 ENCOUNTER — Inpatient Hospital Stay: Payer: Medicaid Other

## 2023-10-29 ENCOUNTER — Encounter: Payer: Self-pay | Admitting: *Deleted

## 2023-10-29 ENCOUNTER — Encounter: Payer: Medicaid Other | Admitting: Obstetrics and Gynecology

## 2023-10-29 ENCOUNTER — Other Ambulatory Visit: Payer: Self-pay | Admitting: *Deleted

## 2023-10-29 DIAGNOSIS — Z170 Estrogen receptor positive status [ER+]: Secondary | ICD-10-CM

## 2023-10-29 LAB — RESEARCH LABS

## 2023-10-29 NOTE — Research (Signed)
[employer] 2021-05 - Specimen Collection Study to Evaluate Biomarkers in Subjects with Cancer     Patient Jody [name] was identified by Dr. Mosetta Putt as a potential candidate for the above listed study.  This Clinical Research Coordinator met with Jody [name], [id number] on 10/29/23 in a manner and location that ensures patient privacy to discuss participation in the above listed research study.  Patient is Unaccompanied.  Patient was previously provided with informed consent documents.  Patient confirmed they have read the informed consent documents.  As outlined in the informed consent form, this Coordinator and Jody [name] discussed the purpose of the research study, the investigational nature of the study, study procedures and requirements for study participation, potential risks and benefits of study participation, as well as alternatives to participation.  This study is not blinded or double-blinded. The patient understands participation is voluntary and they may withdraw from study participation at any time.  This study does not involve randomization.  This study does not involve an investigational drug or device. This study does not involve a placebo. Patient understands enrollment is pending full eligibility review.   Confidentiality and how the patient's information will be used as part of study participation were discussed.  Patient was informed there is reimbursement provided for their time and effort spent on trial participation.  The patient is encouraged to discuss research study participation with their insurance provider to determine what costs they may incur as part of study participation, including research related injury.    All questions were answered to patient's satisfaction.  The informed consent with embedded HIPAA language was reviewed page by page.  The patient's mental and emotional status is appropriate to provide informed consent, and the  patient verbalizes an understanding of study participation.  Patient has agreed to participate in the above listed research study and has voluntarily signed the informed consent version [employer] Approved Version 27 Sep 2020 (Revised 13 Oct 2021) [employer] Approved Version 27 Sep 2020 (Revised 13 Oct 2021) on 10/29/23 at 10:20 AM.  The patient was provided with a copy of the signed informed consent form with embedded HIPAA language for their reference.  No study specific procedures were obtained prior to the signing of the informed consent document.  Approximately 30 minutes were spent with the patient reviewing the informed consent documents.  Patient was not requested to complete a Release of Information form.   Eligibility:  This Coordinator has reviewed this patient's inclusion and exclusion criteria with the patient and confirmed Jody [name] is eligible for study participation.  Jody Boers, RN, completed the second eligibility for this patient and confirmed patient met all criteria for enrollment. In addition, eligibility confirmed by treating investigator Dr. Mosetta Putt, who also agrees that patient should proceed with enrollment. Patient will continue with enrollment.   Medical History: The research coordinator asked the pt the following questions about her current and past medical history. The patient understands that this is data needed for study purposes.  Menopausal status: Jody [name] is Peri-menopausal. Patient LMP 07/2023  Medical History:  High Blood Pressure  Yes Coronary Artery Disease No Lupus    No Rheumatoid Arthritis  No Diabetes   No      Lynch Syndrome  No  Is the patient currently taking a magnesium supplement?   No   Does the patient have a personal history of cancer (greater than 5 years ago)?  No   Does the patient have a family history  of cancer in 1st or 2nd degree relatives? Yes If yes, Relationship(s) and Cancer type(s)? 1st degree mother  breast  Does the patient have history of alcohol consumption? No    Does the patient have history of cigarette, cigar, pipe, or chewing tobacco use?  No   Blood Collection: Research blood samples obtained by fresh venipuncture. Pt tolerated well with no adverse events. Blood to be shipped by research specialist Jody [name].  Gift Card: $50 gift card given for participation by Jody [name] research specialist.  The pt was thanked for her participation in the research study. The research coordinator will request the tissue slides to submit to the study and enter her study data.  Cooper Render, MPH  Clinical Research Coordinator

## 2023-10-29 NOTE — Research (Signed)
[employer] 2021-05 - Specimen Collection Study to Evaluate Biomarkers in Subjects with Cancer   This Nurse has reviewed this patient's inclusion and exclusion criteria as a second review and confirms Jody [name] is eligible for study participation.  Patient may continue with enrollment.  Zerita Boers BSN RN Clinical Research Nurse [facility] Direct Dial: [phone] 10/29/2023  10:11 AM

## 2023-11-01 ENCOUNTER — Encounter: Payer: Self-pay | Admitting: *Deleted

## 2023-11-01 ENCOUNTER — Ambulatory Visit
Admission: RE | Admit: 2023-11-01 | Discharge: 2023-11-01 | Disposition: A | Discharge status: Home / Self Care | Payer: Medicaid Other | Source: Ambulatory Visit | Attending: Surgery | Admitting: Surgery

## 2023-11-01 DIAGNOSIS — C50412 Malignant neoplasm of upper-outer quadrant of left female breast: Secondary | ICD-10-CM

## 2023-11-01 MED ORDER — GADOPICLENOL 0.5 MMOL/ML IV SOLN
10.0000 mL | Freq: Once | INTRAVENOUS | Status: AC | PRN
  Administered 2023-11-01: 10 mL via INTRAVENOUS

## 2023-11-02 ENCOUNTER — Telehealth: Payer: Self-pay | Admitting: Genetic Counselor

## 2023-11-02 ENCOUNTER — Encounter: Payer: Self-pay | Admitting: Genetic Counselor

## 2023-11-02 DIAGNOSIS — Z1379 Encounter for other screening for genetic and chromosomal anomalies: Secondary | ICD-10-CM | POA: Insufficient documentation

## 2023-11-02 NOTE — Telephone Encounter (Signed)
 Disclosed negative BRCAPlus Panel.  Pan-cancer panel is pending.

## 2023-11-03 ENCOUNTER — Encounter: Payer: Self-pay | Admitting: General Practice

## 2023-11-03 NOTE — Progress Notes (Signed)
[facility] Spiritual Care Note  Attempted [facility] follow-up call, leaving voicemail with direct number and encouragement to return call.   Chaplain Jude Linck, MDiv, [facility] Pager [phone] Voicemail [phone] 

## 2023-11-04 ENCOUNTER — Other Ambulatory Visit: Payer: Self-pay | Admitting: Surgery

## 2023-11-04 DIAGNOSIS — Z853 Personal history of malignant neoplasm of breast: Secondary | ICD-10-CM

## 2023-11-05 ENCOUNTER — Other Ambulatory Visit: Payer: Self-pay | Admitting: Surgery

## 2023-11-05 DIAGNOSIS — Z853 Personal history of malignant neoplasm of breast: Secondary | ICD-10-CM

## 2023-11-08 ENCOUNTER — Encounter: Payer: Self-pay | Admitting: *Deleted

## 2023-11-08 ENCOUNTER — Ambulatory Visit: Payer: Self-pay | Admitting: Genetic Counselor

## 2023-11-08 ENCOUNTER — Telehealth: Payer: Self-pay | Admitting: Plastic Surgery

## 2023-11-08 DIAGNOSIS — Z803 Family history of malignant neoplasm of breast: Secondary | ICD-10-CM

## 2023-11-08 DIAGNOSIS — Z170 Estrogen receptor positive status [ER+]: Secondary | ICD-10-CM

## 2023-11-08 DIAGNOSIS — Z1379 Encounter for other screening for genetic and chromosomal anomalies: Secondary | ICD-10-CM

## 2023-11-08 NOTE — Telephone Encounter (Signed)
 Called went to vmail waiting on call back to office

## 2023-11-08 NOTE — Progress Notes (Signed)
 HPI:   Ms. Decker was previously seen in the [facility] due to a personal and family history of breast cancer and concerns regarding a hereditary predisposition to cancer.    Ms. Bottger recent genetic test results were disclosed to her by telephone. These results and recommendations are discussed in more detail below.  CANCER HISTORY:  Oncology History  Malignant neoplasm of upper-outer quadrant of left breast in female, estrogen receptor positive (HCC)  10/08/2023 Cancer Staging   Staging form: Breast, AJCC 8th Edition - Clinical stage from 10/08/2023: Stage IIA (cT3, cN0, cM0, G2, ER+, PR+, HER2-) - Signed by Malachy Mood, MD on 10/19/2023 Stage prefix: Initial diagnosis Histologic grading system: 3 grade system   10/18/2023 Initial Diagnosis   Malignant neoplasm of upper-outer quadrant of left breast in female, estrogen receptor positive (HCC)   11/01/2023 Genetic Testing   Negative [facility] CancerNext+RNAinsight Panel.  Report date is 11/04/2023.   The [facility] CancerNext+RNAinsight Panel includes sequencing, rearrangement analysis, and RNA analysis for the following 39 genes: APC, ATM, BAP1, BARD1, BMPR1A, BRCA1, BRCA2, BRIP1, CDH1, CDKN2A, CHEK2, FH, FLCN, MET, MLH1, MSH2, MSH6, MUTYH, NF1, NTHL1, PALB2, PMS2, PTEN, RAD51C, RAD51D, SMAD4, STK11, TP53, TSC1, TSC2, and VHL (sequencing and deletion/duplication); AXIN2, HOXB13, MBD4, MSH3, POLD1 and POLE (sequencing only); EPCAM and GREM1 (deletion/duplication only).     FAMILY HISTORY:  We obtained a detailed, 4-generation family history.  Significant diagnoses are listed below:      Family History  Problem Relation Age of Onset   Breast cancer Mother 26        no genetic testing   Breast cancer Maternal Aunt          dx late 67s; mother's mat half sister   Cancer Maternal Uncle          x2 mat uncles; unk type; dx >50   Colon cancer Paternal Grandmother          dx >50   Cancer Cousin          unk type; dx 31s;  mat female cousin       Ms. Westergard is unaware of previous family history of genetic testing for hereditary cancer risks. There is no reported Ashkenazi Jewish ancestry. There is no known consanguinity.    GENETIC TEST RESULTS:  The [facility] CancerNext +RNAinsight Panel found no pathogenic mutations.   The [facility] CancerNext+RNAinsight Panel includes sequencing, rearrangement analysis, and RNA analysis for the following 39 genes: APC, ATM, BAP1, BARD1, BMPR1A, BRCA1, BRCA2, BRIP1, CDH1, CDKN2A, CHEK2, FH, FLCN, MET, MLH1, MSH2, MSH6, MUTYH, NF1, NTHL1, PALB2, PMS2, PTEN, RAD51C, RAD51D, SMAD4, STK11, TP53, TSC1, TSC2, and VHL (sequencing and deletion/duplication); AXIN2, HOXB13, MBD4, MSH3, POLD1 and POLE (sequencing only); EPCAM and GREM1 (deletion/duplication only). .   The test report has been scanned into EPIC and is located under the Molecular Pathology section of the Results Review tab.  A portion of the result report is included below for reference. Genetic testing reported out on November 04, 2023.      Even though a pathogenic variant was not identified, possible explanations for the cancer in the family may include: There may be no hereditary risk for cancer in the family. The cancers in Ms. Manton and/or her family may be sporadic/familial or due to other genetic and environmental factors.  Most cancer is not hereditary.  There may be a gene mutation in one of these genes that current testing methods cannot detect but that chance is small. There  could be another gene that has not yet been discovered, or that we have not yet tested, that is responsible for the cancer diagnoses in the family.  It is also possible there is a hereditary cause for the cancer in the family that Ms. Jefcoat did not inherit.   Therefore, it is important to remain in touch with cancer genetics in the future so that we can continue to offer Ms. Vasko the most up to date genetic testing.    ADDITIONAL  GENETIC TESTING:   Ms. Maiolo genetic testing was fairly extensive.  If there are additional relevant genes identified to increase cancer risk that can be analyzed in the future, we would be happy to discuss and coordinate this testing at that time.     CANCER SCREENING RECOMMENDATIONS:  Ms. Lear test result is considered negative (normal).  This means that we have not identified a hereditary cause for her personal history of breast cancer at this time.   An individual's cancer risk and medical management are not determined by genetic test results alone. Overall cancer risk assessment incorporates additional factors, including personal medical history, family history, and any available genetic information that may result in a personalized plan for cancer prevention and surveillance. Therefore, it is recommended she continue to follow the cancer management and screening guidelines provided by her oncology and primary healthcare provider.   RECOMMENDATIONS FOR FAMILY MEMBERS:   Since she did not inherit a identifiable mutation in a cancer predisposition gene included on this panel, her children could not have inherited a known mutation from her in one of these genes. Individuals in this family might be at some increased risk of developing cancer, over the general population risk, due to the family history of cancer.  Individuals in the family should notify their providers of the family history of cancer. We recommend women in this family have a yearly mammogram beginning at age 16, or 35 years younger than the earliest onset of cancer, an annual clinical breast exam, and perform monthly breast self-exams.  Risk models that take into account family history and hormonal history may be helpful in determining appropriate breast cancer screening options for family members.  Other members of the family may still carry a pathogenic variant in one of these genes that Ms. Pharr did not inherit. Based on  the family history, we recommend her mother, who was diagnosed with breast cancer at age 21, have genetic counseling and testing. Ms. Dinkel can let us know if we can be of any assistance in coordinating genetic counseling and/or testing for these family member.     FOLLOW-UP:  Cancer genetics is a rapidly advancing field and it is possible that new genetic tests will be appropriate for her and/or her family members in the future. We encourage Ms. Wojtaszek to remain in contact with cancer genetics, so we can update her personal and family histories and let her know of advances in cancer genetics that may benefit this family.   Our contact number was provided.  They are welcome to call us at anytime with additional questions or concerns.   Vietta Bonifield M. Rennie Plowman, MS, [facility] Genetic Counselor [email or web address] (P) [phone]

## 2023-11-08 NOTE — Telephone Encounter (Signed)
 Called last week and today, phone keeps going to vmail, unable to schedule at the moment

## 2023-11-09 ENCOUNTER — Telehealth: Payer: Self-pay | Admitting: Plastic Surgery

## 2023-11-09 ENCOUNTER — Ambulatory Visit (INDEPENDENT_AMBULATORY_CARE_PROVIDER_SITE_OTHER): Payer: Medicaid Other | Admitting: Plastic Surgery

## 2023-11-09 ENCOUNTER — Other Ambulatory Visit: Payer: Self-pay | Admitting: Surgery

## 2023-11-09 VITALS — BP 150/87 | HR 72 | Ht 64.5 in | Wt 224.0 lb

## 2023-11-09 DIAGNOSIS — Z170 Estrogen receptor positive status [ER+]: Secondary | ICD-10-CM

## 2023-11-09 DIAGNOSIS — C50412 Malignant neoplasm of upper-outer quadrant of left female breast: Secondary | ICD-10-CM

## 2023-11-09 NOTE — Progress Notes (Addendum)
 Patient ID: Jody [name], female    DOB: Aug 25, 1971, 53 y.o.   MRN: [id number]   Chief Complaint  Patient presents with   Advice Only    The patient is a 53 year old female here for evaluation for breast reconstruction.  She was recently diagnosed with breast cancer.  It is left upper outer quadrant.  The pathology showed invasive ductal carcinoma.  It is estrogen and progesterone positive and HER2 negative with a Ki-67 of 5%.  He is not a smoker.  She does have a family history of breast cancer. Her past medical history is positive for hypertension, anxiety, depression, endometriosis, gallbladder disease, reflux and asthma. She is 5 feet 4 inches tall weighs 224 pounds.  Her breast side is likely a F cup.  She is planning on a left breast partial mastectomy.     Review of Systems  Constitutional: Negative.   HENT: Negative.    Eyes: Negative.   Respiratory: Negative.  Negative for chest tightness.   Cardiovascular: Negative.   Gastrointestinal: Negative.   Endocrine: Negative.   Genitourinary: Negative.   Musculoskeletal:  Positive for back pain and neck pain.    Past Medical History:  Diagnosis Date   Anemia    Anxiety    Asthma    Breast cancer (HCC)    Depression    GERD (gastroesophageal reflux disease)    History of anal fissures    History of gallstones    Hypertension    Migraine headache with aura    Migraines    without aura   Obesity     Past Surgical History:  Procedure Laterality Date   BREAST BIOPSY Left 10/08/2023   MM LT BREAST BX W LOC DEV 1ST LESION IMAGE BX SPEC STEREO GUIDE 10/08/2023 [facility]   BREAST BIOPSY Left 10/08/2023   MM LT BREAST BX W LOC DEV EA AD LESION IMG BX SPEC STEREO GUIDE 10/08/2023 [facility]   CHOLECYSTECTOMY     ESOPHAGOGASTRODUODENOSCOPY     FLEXIBLE SIGMOIDOSCOPY  03/09/2012   Procedure: FLEXIBLE SIGMOIDOSCOPY;  Surgeon: Louis Meckel, MD;  Location: [facility];  Service: Endoscopy;   Laterality: N/A;   gastric sleeve surgery  03/14/2021   TUBAL LIGATION     [facility]      Current Outpatient Medications:    albuterol (VENTOLIN HFA) 108 (90 Base) MCG/ACT inhaler, INHALE 2 PUFFS 4 TIMES A DAY AS NEEDED, Disp: 18 each, Rfl: 1   benzonatate (TESSALON) 100 MG capsule, Take 1 capsule (100 mg total) by mouth every 8 (eight) hours., Disp: 21 capsule, Rfl: 0   famotidine (PEPCID) 20 MG tablet, Take 1 tablet (20 mg total) by mouth 2 (two) times daily., Disp: 60 tablet, Rfl: 11   fluticasone (FLONASE) 50 MCG/ACT nasal spray, SPRAY 2 SPRAYS INTO EACH NOSTRIL EVERY DAY, Disp: 48 mL, Rfl: 1   hydrocortisone (ANUSOL-HC) 25 MG suppository, Place 1 suppository (25 mg total) rectally 2 (two) times daily as needed for hemorrhoids or anal itching., Disp: 24 suppository, Rfl: 0   metoprolol tartrate (LOPRESSOR) 50 MG tablet, Take 1 tablet (50 mg total) by mouth 2 (two) times daily., Disp: 180 tablet, Rfl: 1   norethindrone (ORTHO MICRONOR) 0.35 MG tablet, Take 1 tablet (0.35 mg total) by mouth daily. (Patient taking differently: Take 1 tablet by mouth daily. For endometriosis), Disp: 84 tablet, Rfl: 2   Objective:   Vitals:   11/09/23 1510  BP: (!) 150/87  Pulse: 72  SpO2: 97%  Physical Exam Vitals reviewed.  Constitutional:      Appearance: Normal appearance.  Cardiovascular:     Rate and Rhythm: Normal rate.     Pulses: Normal pulses.  Pulmonary:     Effort: Pulmonary effort is normal.  Abdominal:     Palpations: Abdomen is soft.  Musculoskeletal:        General: No swelling or deformity.  Skin:    General: Skin is warm.     Capillary Refill: Capillary refill takes less than 2 seconds.  Neurological:     Mental Status: She is alert and oriented to person, place, and time.  Psychiatric:        Mood and Affect: Mood normal.        Behavior: Behavior normal.        Thought Content: Thought content normal.        Judgment: Judgment normal.     Assessment & Plan:   Malignant neoplasm of upper-outer quadrant of left breast in female, estrogen receptor positive (HCC)  The patient is an excellent candidate for bilateral oncoplastic breast reduction.  I think this should be done prior to radiation.  The patient is in agreement.  Plan for bilateral breast reduction with possible liposuction once margins are clear after the partial left mastectomy. Pictures were obtained of the patient and placed in the chart with the patient's or guardian's permission.   Alena Bills Refael Fulop, DO

## 2023-11-09 NOTE — Addendum Note (Signed)
 Addended by: Peggye Form on: 11/09/2023 03:58 PM   Modules accepted: Orders

## 2023-11-09 NOTE — Telephone Encounter (Signed)
 Called and lvmail again to try and schedule called 11-09-23

## 2023-11-10 ENCOUNTER — Encounter (HOSPITAL_BASED_OUTPATIENT_CLINIC_OR_DEPARTMENT_OTHER): Payer: Self-pay | Admitting: Surgery

## 2023-11-10 ENCOUNTER — Other Ambulatory Visit: Payer: Self-pay

## 2023-11-12 ENCOUNTER — Ambulatory Visit (INDEPENDENT_AMBULATORY_CARE_PROVIDER_SITE_OTHER): Payer: Medicaid Other | Admitting: Obstetrics and Gynecology

## 2023-11-12 ENCOUNTER — Ambulatory Visit: Payer: Medicaid Other | Attending: Hematology | Admitting: Rehabilitation

## 2023-11-12 ENCOUNTER — Encounter: Payer: Self-pay | Admitting: *Deleted

## 2023-11-12 ENCOUNTER — Other Ambulatory Visit: Payer: Self-pay

## 2023-11-12 ENCOUNTER — Encounter: Payer: Self-pay | Admitting: Obstetrics and Gynecology

## 2023-11-12 ENCOUNTER — Encounter: Payer: Self-pay | Admitting: Rehabilitation

## 2023-11-12 ENCOUNTER — Other Ambulatory Visit (HOSPITAL_COMMUNITY)
Admission: RE | Admit: 2023-11-12 | Discharge: 2023-11-12 | Disposition: A | Discharge status: Home / Self Care | Source: Ambulatory Visit | Attending: Obstetrics and Gynecology | Admitting: Obstetrics and Gynecology

## 2023-11-12 VITALS — BP 122/64 | HR 58 | Temp 98.2°F | Ht 64.5 in | Wt 226.0 lb

## 2023-11-12 DIAGNOSIS — Z1331 Encounter for screening for depression: Secondary | ICD-10-CM

## 2023-11-12 DIAGNOSIS — Z170 Estrogen receptor positive status [ER+]: Secondary | ICD-10-CM | POA: Diagnosis present

## 2023-11-12 DIAGNOSIS — R293 Abnormal posture: Secondary | ICD-10-CM | POA: Insufficient documentation

## 2023-11-12 DIAGNOSIS — N951 Menopausal and female climacteric states: Secondary | ICD-10-CM

## 2023-11-12 DIAGNOSIS — Z01419 Encounter for gynecological examination (general) (routine) without abnormal findings: Secondary | ICD-10-CM | POA: Insufficient documentation

## 2023-11-12 DIAGNOSIS — Z124 Encounter for screening for malignant neoplasm of cervix: Secondary | ICD-10-CM | POA: Diagnosis present

## 2023-11-12 DIAGNOSIS — R5383 Other fatigue: Secondary | ICD-10-CM

## 2023-11-12 DIAGNOSIS — C50412 Malignant neoplasm of upper-outer quadrant of left female breast: Secondary | ICD-10-CM | POA: Insufficient documentation

## 2023-11-12 DIAGNOSIS — F4323 Adjustment disorder with mixed anxiety and depressed mood: Secondary | ICD-10-CM | POA: Diagnosis not present

## 2023-11-12 DIAGNOSIS — F419 Anxiety disorder, unspecified: Secondary | ICD-10-CM

## 2023-11-12 DIAGNOSIS — F32A Depression, unspecified: Secondary | ICD-10-CM | POA: Diagnosis not present

## 2023-11-12 MED ORDER — LORAZEPAM 1 MG PO TABS: 0.5000 mg | ORAL_TABLET | Freq: Every day | ORAL | 0 refills | Status: AC | PRN

## 2023-11-12 NOTE — Therapy (Signed)
 OUTPATIENT PHYSICAL THERAPY BREAST CANCER BASELINE EVALUATION   Patient Name: Jody [name] MRN: [id number] DOB:May 06, 1971, 53 y.o., female Today's Date: 11/12/2023  END OF SESSION:  PT End of Session - 11/12/23 1037     Visit Number 1    Number of Visits 2    Date for PT Re-Evaluation 12/28/23    Authorization Type auth required    PT Start Time 0955    PT Stop Time 1035    PT Time Calculation (min) 40 min    Activity Tolerance Patient tolerated treatment well    Behavior During Therapy WFL for tasks assessed/performed             Past Medical History:  Diagnosis Date   Anemia    Anxiety    Asthma    Breast cancer (HCC)    Depression    GERD (gastroesophageal reflux disease)    History of anal fissures    History of gallstones    Hypertension    Migraine headache with aura    Migraines    without aura   Obesity    Past Surgical History:  Procedure Laterality Date   BREAST BIOPSY Left 10/08/2023   MM LT BREAST BX W LOC DEV 1ST LESION IMAGE BX SPEC STEREO GUIDE 10/08/2023 [facility]   BREAST BIOPSY Left 10/08/2023   MM LT BREAST BX W LOC DEV EA AD LESION IMG BX SPEC STEREO GUIDE 10/08/2023 [facility]   CHOLECYSTECTOMY     ESOPHAGOGASTRODUODENOSCOPY     FLEXIBLE SIGMOIDOSCOPY  03/09/2012   Procedure: FLEXIBLE SIGMOIDOSCOPY;  Surgeon: Louis Meckel, MD;  Location: [facility];  Service: Endoscopy;  Laterality: N/A;   gastric sleeve surgery  03/14/2021   TUBAL LIGATION     [facility]   Patient Active Problem List   Diagnosis Date Noted   Genetic testing 11/02/2023   Malignant neoplasm of upper-outer quadrant of left breast in female, estrogen receptor positive (HCC) 10/18/2023   Bronchitis 12/21/2022   Colon cancer screening 12/21/2022   Allergies 12/21/2022   Need for hepatitis C screening test 12/21/2022   Abnormal vaginal bleeding 12/30/2020   Endometriosis 08/21/2019   Low back pain with radiation 04/28/2019   Stress reaction  05/16/2017   Essential hypertension 06/18/2015   Hyperglycemia 06/18/2015   Hemorrhoids 01/31/2012   Fatigue 12/31/2011   Dyspepsia 12/31/2011   B12 deficiency 01/13/2011   Anxiety and depression 12/26/2010   Migraines 12/15/2010   Stress 12/15/2010   OBESITY, MORBID 04/04/2007    PCP: Seabron Spates, DO  REFERRING PROVIDER: Dr. Malachy Mood  REFERRING DIAG:  Diagnosis  C50.412,Z17.0 (ICD-10-CM) - Malignant neoplasm of upper-outer quadrant of left breast in female, estrogen receptor positive (HCC)    THERAPY DIAG:  Malignant neoplasm of upper-outer quadrant of left breast in female, estrogen receptor positive (HCC)  Abnormal posture  Rationale for Evaluation and Treatment: Rehabilitation  ONSET DATE: 10/20/23  SUBJECTIVE:  SUBJECTIVE STATEMENT: Patient reports she is here today to be seen by her medical team for her newly diagnosed left breast cancer.   PERTINENT HISTORY:  Patient was diagnosed on 10/20/23 with left grade IDC with DCIS measures 5.1 cm and is located in the upper outer quadrant. It is ER/PR positive, HER2 neg with a Ki67 of 5%. Will have Lt breast lumpectomy with SLNB on 11/17/23.  Then a reduction with Dr. Ulice Bold later.  Then will have radiation.    PATIENT GOALS:   reduce lymphedema risk and learn post op HEP.   PAIN:  Are you having pain? Yes: NPRS scale: 4 Pain location: Lt anterior chest  Pain description: achy, tender Aggravating factors: Nothing Relieving factors: Nothing  Most likely from biopsy   PRECAUTIONS: Active CA None  RED FLAGS: None   HAND DOMINANCE: right  WEIGHT BEARING RESTRICTIONS: No  FALLS:  Has patient fallen in last 6 months? Yes. Number of falls 1  - I was walking into the house and felt like my legs give out.  It has happened before.     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   LEISURE: Nothing really   PRIOR LEVEL OF FUNCTION: Independent   OBJECTIVE: Note: Objective measures were completed at Evaluation unless otherwise noted.  COGNITION: Overall cognitive status: Within functional limits for tasks assessed    POSTURE:  Forward head and rounded shoulders posture  UPPER EXTREMITY AROM/PROM:  A/PROM RIGHT   eval   Shoulder extension 45  Shoulder flexion 140  Shoulder abduction 155  Shoulder internal rotation 50  Shoulder external rotation 80    (Blank rows = not tested)  A/PROM LEFT   Eval - the left arm had pain with all the movements in the shoulder 4/10  Shoulder extension 45  Shoulder flexion 145  Shoulder abduction 150  Shoulder internal rotation 60  Shoulder external rotation 70    (Blank rows = not tested)  CERVICAL AROM: All within normal limits:   LYMPHEDEMA ASSESSMENTS (in cm):   LANDMARK RIGHT   eval  10 cm proximal to olecranon process 39.8  Olecranon process 28.2  10 cm proximal to ulnar styloid process 22.5  Just proximal to ulnar styloid process 17.2  Across hand at thumb web space 19.8  At base of 2nd digit 6.5  (Blank rows = not tested)  LANDMARK LEFT   eval  10 cm proximal to olecranon process 40  Olecranon process 29  10 cm proximal to ulnar styloid process 23  Just proximal to ulnar styloid process 17  Across hand at thumb web space 20.2  At base of 2nd digit 6.5  (Blank rows = not tested)  L-DEX LYMPHEDEMA SCREENING: The patient was assessed using the L-Dex machine today to produce a lymphedema index baseline score. The patient will be reassessed on a regular basis (typically every 3 months) to obtain new L-Dex scores. If the score is > 6.5 points away from his/her baseline score indicating onset of subclinical lymphedema, it will be recommended to wear a compression  garment for 4 weeks, 12 hours per day and then be reassessed. If the score continues to be > 6.5 points from baseline at reassessment, we will initiate lymphedema treatment. Assessing in this manner has a 95% rate of preventing clinically significant lymphedema.  QUICK DASH SURVEY: 59% limited   PATIENT EDUCATION:  Education details: Time spent educating patient on  aspects of self-care to maximize post op recovery. Patient was educated on where and how to get a post op compression bra to use to reduce post op edema. Patient was also educated on the use of SOZO screenings and surveillance principles for early identification of lymphedema onset. She was instructed to use the post op pillow in the axilla for pressure and pain relief. Patient educated on lymphedema risk reduction and post op shoulder/posture HEP. Person educated: Patient Education method: Explanation, Demonstration, Handout Education comprehension: Patient verbalized understanding and returned demonstration  HOME EXERCISE PROGRAM: Patient was instructed today in a home exercise program today for post op shoulder range of motion. These included active assist shoulder flexion in sitting, scapular retraction, wall walking with shoulder abduction, and hands behind head external rotation.  She was encouraged to do these twice a day, holding 3 seconds and repeating 5 times when permitted by her physician.   ASSESSMENT:  CLINICAL IMPRESSION: Pt will benefit from a post op PT reassessment to determine needs and from L-Dex screens every 3 months for 2 years to detect subclinical lymphedema after her left lumpectomy and SLNB.  She is having some Lt shoulder/anterior chest swelling - possible hematoma per chart note after biopsy.  Pt will benefit from skilled therapeutic intervention to improve on the following deficits: Decreased knowledge of precautions, impaired UE functional use, pain, decreased ROM, postural dysfunction.   PT  treatment/interventions: ADL/self-care home management, pt/family education, therapeutic exercise  REHAB POTENTIAL: Excellent  CLINICAL DECISION MAKING: Stable/uncomplicated  EVALUATION COMPLEXITY: Low   GOALS: Goals reviewed with patient? YES  LONG TERM GOALS: (STG=LTG)    Name Target Date Goal status  1 Pt will be able to verbalize understanding of pertinent lymphedema risk reduction practices relevant to her dx specifically related to skin care.  Baseline:  No knowledge 11/12/2023 Achieved at eval  2 Pt will be able to return demo and/or verbalize understanding of the post op HEP related to regaining shoulder ROM. Baseline:  No knowledge 11/12/2023 Achieved at eval  3 Pt will be able to verbalize understanding of the importance of viewing the post op After Breast CA Class video for further lymphedema risk reduction education and therapeutic exercise.  Baseline:  No knowledge 11/12/2023 Achieved at eval  4 Pt will demo she has regained full shoulder ROM and function post operatively compared to baselines.  Baseline: See objective measurements taken today. 12/28/23     PLAN:  PT FREQUENCY/DURATION: EVAL and 1 follow up appointment.   PLAN FOR NEXT SESSION: will reassess 3-4 weeks post op to determine needs.   Patient will follow up at outpatient cancer rehab 3-4 weeks following surgery.  If the patient requires physical therapy at that time, a specific plan will be dictated and sent to the referring physician for approval. The patient was educated today on appropriate basic range of motion exercises to begin post operatively and the importance of viewing the After Breast Cancer class video following surgery.  Patient was educated today on lymphedema risk reduction practices as it pertains to recommendations that will benefit the patient immediately following surgery.  She verbalized good understanding.    Physical Therapy Information for After Breast Cancer  Surgery/Treatment:  Lymphedema is a swelling condition that you may be at risk for in your arm if you have lymph nodes removed from the armpit area.  After a sentinel node biopsy, the risk is approximately 5-9% and is higher after an axillary node dissection.  There is treatment available for  this condition and it is not life-threatening.  Contact your physician or physical therapist with concerns. You may begin the 4 shoulder/posture exercises (see additional sheet) when permitted by your physician (typically a week after surgery).  If you have drains, you may need to wait until those are removed before beginning range of motion exercises.  A general recommendation is to not lift your arms above shoulder height until drains are removed.  These exercises should be done to your tolerance and gently.  This is not a "no pain/no gain" type of recovery so listen to your body and stretch into the range of motion that you can tolerate, stopping if you have pain.  If you are having immediate reconstruction, ask your plastic surgeon about doing exercises as he or she may want you to wait. We encourage you to attend the free one time ABC (After Breast Cancer) class offered by [facility].  You will learn information related to lymphedema risk, prevention and treatment and additional exercises to regain mobility following surgery.  You can call [phone] for more information.  This is offered the 1st and 3rd Monday of each month.  You only attend the class one time. While undergoing any medical procedure or treatment, try to avoid blood pressure being taken or needle sticks from occurring on the arm on the side of cancer.   This recommendation begins after surgery and continues for the rest of your life.  This may help reduce your risk of getting lymphedema (swelling in your arm). An excellent resource for those seeking information on lymphedema is the National Lymphedema Network's web site. It  can be accessed at www.lymphnet.org If you notice swelling in your hand, arm or breast at any time following surgery (even if it is many years from now), please contact your doctor or physical therapist to discuss this.  Lymphedema can be treated at any time but it is easier for you if it is treated early on.  If you feel like your shoulder motion is not returning to normal in a reasonable amount of time, please contact your surgeon or physical therapist.  [facility] [phone]. [address], [address], [place] [address]  ABC CLASS After Breast Cancer Class  After Breast Cancer Class is a specially designed exercise class video to assist you in a safe recover after having breast cancer surgery.  In this video you will learn how to get back to full function whether your drains were just removed or if you had surgery a month ago. The video can be viewed on this page: [email or web address]-meyer.org/ or on YouTube here: [email or web address].  Class Goals  Understand specific stretches to improve the flexibility of you chest and shoulder. Learn ways to safely strengthen your upper body and improve your posture. Understand the warning signs of infection and why you may be at risk for an arm infection. Learn about Lymphedema and prevention.  ** You do not need to view this video until after surgery.  Drains should be removed to participate in the recommended exercises on the video.  Patient was instructed today in a home exercise program today for post op shoulder range of motion. These included active assist shoulder flexion in sitting, scapular retraction, wall walking with shoulder abduction, and hands behind head external rotation.  She was encouraged to do these twice a day, holding 3 seconds and repeating 5 times when permitted by her physician.    Idamae Lusher, PT  11/12/2023, 10:37 AM

## 2023-11-12 NOTE — Progress Notes (Signed)
 53 y.o. [id number] female with HTN, left breast cancer (dx Jan 2025), hx of AUB here for annual exam. 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  Feels more tired. Cycles are becoming more irregular, however they are still heavy when they occur. Last period was in November and then skipped until February. Stopped POP over 1 year ago due to insurance issues.  Planning for lumpectomy on 11/17/23 with lymph node dissection. Admits diet is not great, mostly snacks since sleeve gastrectomy.  PHQ-9: 12 today. Has used ativan before as needed. She has also used abilify for mood symptoms but it has been years.  Patient's last menstrual period was 10/30/2023 (approximate). Period Duration (Days): 5 Period Pattern: Regular Menstrual Flow: Heavy Menstrual Control: Tampon Dysmenorrhea: (!) Mild Dysmenorrhea Symptoms: Headache  Abnormal bleeding: as noted Pelvic discharge or pain: none Breast mass, nipple discharge or skin changes : none, however planning for lumpectomy as noted Birth control: abstinent Last PAP:     Component Value Date/Time   DIAGPAP  07/25/2018 0000    NEGATIVE FOR INTRAEPITHELIAL LESIONS OR MALIGNANCY.   ADEQPAP  07/25/2018 0000    Satisfactory for evaluation  endocervical/transformation zone component ABSENT.   Last mammogram: 11/01/2023  Last colonoscopy: 07/15/2023  Sexually active: no  Exercising: walk x30 min/day Smoker: no  GYN HISTORY: Left breast cancer, dx Jan 2025  OB History  Gravida Para Term Preterm AB Living  3 3 0 0 0 3  SAB IAB Ectopic Multiple Live Births  0 0 0 0 0    # Outcome Date GA Lbr Len/2nd Weight Sex Type Anes PTL Lv  3 Para      Vag-Spont     2 Para      Vag-Spont     1 Para      Vag-Spont       Past Medical History:  Diagnosis Date   Anemia    Anxiety    Asthma    Breast cancer (HCC)    Depression    GERD (gastroesophageal reflux disease)    History of anal fissures    History of gallstones    Hypertension    Migraine headache  with aura    Migraines    without aura   Obesity     Past Surgical History:  Procedure Laterality Date   BREAST BIOPSY Left 10/08/2023   MM LT BREAST BX W LOC DEV 1ST LESION IMAGE BX SPEC STEREO GUIDE 10/08/2023 [facility]   BREAST BIOPSY Left 10/08/2023   MM LT BREAST BX W LOC DEV EA AD LESION IMG BX SPEC STEREO GUIDE 10/08/2023 [facility]   CHOLECYSTECTOMY     ESOPHAGOGASTRODUODENOSCOPY     FLEXIBLE SIGMOIDOSCOPY  03/09/2012   Procedure: FLEXIBLE SIGMOIDOSCOPY;  Surgeon: Louis Meckel, MD;  Location: [facility];  Service: Endoscopy;  Laterality: N/A;   gastric sleeve surgery  03/14/2021   TUBAL LIGATION     Essure    Current Outpatient Medications on File Prior to Visit  Medication Sig Dispense Refill   albuterol (VENTOLIN HFA) 108 (90 Base) MCG/ACT inhaler INHALE 2 PUFFS 4 TIMES A DAY AS NEEDED 18 each 1   famotidine (PEPCID) 20 MG tablet Take 1 tablet (20 mg total) by mouth 2 (two) times daily. 60 tablet 11   fluticasone (FLONASE) 50 MCG/ACT nasal spray SPRAY 2 SPRAYS INTO EACH NOSTRIL EVERY DAY 48 mL 1   hydrocortisone (ANUSOL-HC) 25 MG suppository Place 1 suppository (25 mg total) rectally 2 (two) times daily as needed for hemorrhoids  or anal itching. 24 suppository 0   ibuprofen (ADVIL) 800 MG tablet Take 800 mg by mouth every 6 (six) hours as needed.     metoprolol tartrate (LOPRESSOR) 50 MG tablet Take 1 tablet (50 mg total) by mouth 2 (two) times daily. 180 tablet 1   Multiple Vitamin (MULTI VITAMIN PO) Take by mouth.     No current facility-administered medications on file prior to visit.    Social History   Socioeconomic History   Marital status: Single    Spouse name: Not on file   Number of children: 3   Years of education: Not on file   Highest education level: Not on file  Occupational History   Occupation: disability    Employer: [employer]  Tobacco Use   Smoking status: Never   Smokeless tobacco: Never  Vaping Use    Vaping status: Never Used  Substance and Sexual Activity   Alcohol use: No   Drug use: No   Sexual activity: Not Currently    Partners: Male    Comment: Essure  Other Topics Concern   Not on file  Social History Narrative   Exercise-- walking 30 min daily   Social Drivers of Health   Financial Resource Strain: High Risk (10/26/2023)   Overall Financial Resource Strain (CARDIA)    Difficulty of Paying Living Expenses: Very hard  Food Insecurity: No Food Insecurity (10/22/2023)   Hunger Vital Sign    Worried About Running Out of Food in the Last Year: Never true    Ran Out of Food in the Last Year: Never true  Transportation Needs: Unmet Transportation Needs (10/22/2023)   PRAPARE - Administrator, Civil Service (Medical): Yes    Lack of Transportation (Non-Medical): Yes  Physical Activity: Not on file  Stress: Not on file  Social Connections: Not on file  Intimate Partner Violence: Not At Risk (10/22/2023)   Humiliation, Afraid, Rape, and Kick questionnaire    Fear of Current or Ex-Partner: No    Emotionally Abused: No    Physically Abused: No    Sexually Abused: No    Family History  Problem Relation Age of Onset   Breast cancer Mother 12       no genetic testing   Hypertension Mother    Hepatitis Father 29       Hep C   Diabetes Father    Arthritis Father    Hypertension Father    Kidney disease Father    Heart attack Father 81   Breast cancer Maternal Aunt        dx late 96s; mother's mat half sister   Cancer Maternal Uncle        x2 mat uncles; unk type; dx >50   Coronary artery disease Maternal Grandmother    Cataracts Maternal Grandmother    Glaucoma Maternal Grandmother    Heart disease Maternal Grandmother    Colon cancer Paternal Grandmother        dx >50   Obesity Paternal Grandmother    Cancer Cousin        unk type; dx 76s; mat female cousin   Obesity Other    Stomach cancer Neg Hx    Rectal cancer Neg Hx     No Known Allergies     PE Today's Vitals   11/12/23 1137  BP: 122/64  Pulse: (!) 58  Temp: 98.2 F (36.8 C)  TempSrc: Oral  SpO2: 98%  Weight: 226 lb (102.5 kg)  Height: 5' 4.5" (1.638 m)   Body mass index is 38.19 kg/m.  Physical Exam Vitals reviewed. Exam conducted with a chaperone present.  Constitutional:      General: She is not in acute distress.    Appearance: Normal appearance.  HENT:     Head: Normocephalic and atraumatic.     Nose: Nose normal.  Eyes:     Extraocular Movements: Extraocular movements intact.     Conjunctiva/sclera: Conjunctivae normal.  Neck:     Thyroid: No thyroid mass, thyromegaly or thyroid tenderness.  Pulmonary:     Effort: Pulmonary effort is normal.  Chest:     Chest wall: No mass or tenderness.  Breasts:    Right: Normal. No swelling, mass, nipple discharge, skin change or tenderness.     Left: Normal. No swelling, mass, nipple discharge, skin change or tenderness.  Abdominal:     General: There is no distension.     Palpations: Abdomen is soft.     Tenderness: There is no abdominal tenderness.  Genitourinary:    General: Normal vulva.     Exam position: Lithotomy position.     Urethra: No prolapse.     Vagina: Normal. No vaginal discharge or bleeding.     Cervix: Normal. No lesion.     Uterus: Normal. Not enlarged and not tender.      Adnexa: Right adnexa normal and left adnexa normal.  Musculoskeletal:        General: Normal range of motion.     Cervical back: Normal range of motion.  Lymphadenopathy:     Upper Body:     Right upper body: No axillary adenopathy.     Left upper body: No axillary adenopathy.     Lower Body: No right inguinal adenopathy. No left inguinal adenopathy.  Skin:    General: Skin is warm and dry.  Neurological:     General: No focal deficit present.     Mental Status: She is alert.  Psychiatric:        Mood and Affect: Mood normal.        Behavior: Behavior normal.       Assessment and Plan:        Well woman  exam with routine gynecological exam Assessment & Plan: Cervical cancer screening performed according to ASCCP guidelines. Encouraged annual mammogram screening Colonoscopy UTD DXA N/A Labs and immunizations with her primary Encouraged safe sexual practices as indicated Encouraged healthy lifestyle practices with diet and exercise For patients under 50-70yo, I recommend 1200mg  calcium daily and 600IU of vitamin D daily.    Cervical cancer screening -     Cytology - PAP  Perimenopause -     Follicle stimulating hormone -     Estradiol -     TSH  Other fatigue Assessment & Plan: Normal CBC and CMP Will check for hormonal cause   Adjustment reaction with anxiety and depression -     LORazepam; Take 0.5 tablets (0.5 mg total) by mouth daily as needed for anxiety.  Dispense: 30 tablet; Refill: 0  Malignant neoplasm of upper-outer quadrant of left breast in female, estrogen receptor positive [facility]) Assessment & Plan: Planning for lumpectomy on 11/17/23 with lymph node dissection with Dr. Denton Lank   Anxiety and depression Assessment & Plan: Recommend SSRI for symptoms, however patient has used ativan in the past PRN Will provide 30d rx in the setting of recent diagnosis If patient is using daily then I recommend that she be switched to appropriate  therapy Will recommend psych referral at that time given prior use of abilify     Rosalyn Gess, MD

## 2023-11-12 NOTE — Patient Instructions (Signed)

## 2023-11-12 NOTE — Assessment & Plan Note (Signed)
 Planning for lumpectomy on 11/17/23 with lymph node dissection with Dr. Denton Lank

## 2023-11-12 NOTE — Assessment & Plan Note (Signed)
 Recommend SSRI for symptoms, however patient has used ativan in the past PRN Will provide 30d rx in the setting of recent diagnosis If patient is using daily then I recommend that she be switched to appropriate therapy Will recommend psych referral at that time given prior use of abilify

## 2023-11-12 NOTE — Assessment & Plan Note (Signed)
 Cervical cancer screening performed according to ASCCP guidelines. Encouraged annual mammogram screening Colonoscopy UTD DXA N/A Labs and immunizations with her primary Encouraged safe sexual practices as indicated Encouraged healthy lifestyle practices with diet and exercise For patients under 50-51yo, I recommend 1200mg  calcium daily and 600IU of vitamin D daily.

## 2023-11-12 NOTE — Assessment & Plan Note (Signed)
 Normal CBC and CMP Will check for hormonal cause

## 2023-11-13 LAB — FOLLICLE STIMULATING HORMONE: FSH: 5.4 m[IU]/mL

## 2023-11-13 LAB — TSH: TSH: 0.96 m[IU]/L

## 2023-11-13 LAB — ESTRADIOL: Estradiol: 140 pg/mL

## 2023-11-15 ENCOUNTER — Ambulatory Visit: Payer: Medicaid Other | Admitting: Physical Therapy

## 2023-11-15 ENCOUNTER — Encounter: Payer: Self-pay | Admitting: Obstetrics and Gynecology

## 2023-11-16 ENCOUNTER — Encounter (HOSPITAL_BASED_OUTPATIENT_CLINIC_OR_DEPARTMENT_OTHER)
Admission: RE | Admit: 2023-11-16 | Discharge: 2023-11-16 | Disposition: A | Discharge status: Home / Self Care | Source: Ambulatory Visit | Attending: Surgery | Admitting: Surgery

## 2023-11-16 ENCOUNTER — Ambulatory Visit
Admission: RE | Admit: 2023-11-16 | Discharge: 2023-11-16 | Disposition: A | Discharge status: Home / Self Care | Payer: Medicaid Other | Source: Ambulatory Visit | Attending: Surgery | Admitting: Surgery

## 2023-11-16 DIAGNOSIS — Z01818 Encounter for other preprocedural examination: Secondary | ICD-10-CM | POA: Diagnosis present

## 2023-11-16 DIAGNOSIS — Z170 Estrogen receptor positive status [ER+]: Secondary | ICD-10-CM | POA: Diagnosis not present

## 2023-11-16 DIAGNOSIS — Z01810 Encounter for preprocedural cardiovascular examination: Secondary | ICD-10-CM | POA: Insufficient documentation

## 2023-11-16 DIAGNOSIS — Z1732 Human epidermal growth factor receptor 2 negative status: Secondary | ICD-10-CM | POA: Diagnosis not present

## 2023-11-16 DIAGNOSIS — Z1721 Progesterone receptor positive status: Secondary | ICD-10-CM | POA: Diagnosis not present

## 2023-11-16 DIAGNOSIS — Z853 Personal history of malignant neoplasm of breast: Secondary | ICD-10-CM

## 2023-11-16 DIAGNOSIS — C50412 Malignant neoplasm of upper-outer quadrant of left female breast: Secondary | ICD-10-CM | POA: Diagnosis not present

## 2023-11-16 HISTORY — PX: BREAST BIOPSY: SHX20

## 2023-11-16 MED ORDER — ENSURE PRE-SURGERY PO LIQD
296.0000 mL | Freq: Once | ORAL | Status: DC
Start: 2023-11-17 — End: 2023-11-17

## 2023-11-16 MED ORDER — CHLORHEXIDINE GLUCONATE CLOTH 2 % EX PADS: 6.0000 | MEDICATED_PAD | Freq: Once | CUTANEOUS | Status: DC

## 2023-11-16 NOTE — H&P (Signed)
 REFERRING PHYSICIAN: Malachy Mood, MD PROVIDER: Wayne Both, MD MRN: [id number] DOB: April 24, 1971  Subjective   Chief Complaint: Breast Cancer  History of Present Illness: Jody [name] is a 53 y.o. female who is seen as an office consultation for evaluation of Breast Cancer  This is a 53 year old female was found to have calcifications in her left breast on recent screening mammography. These measured 5.1 cm in total size. She had the most posterior and expect in the most anterior aspect biopsy. The biopsy showed invasive ductal carcinoma which was grade 2 as well as DCIS. The anterior biopsy was benign but felt to be discordant. The malignancy was 85% ER positive, 90% PR positive, HER2 negative, and had a Ki-67 of 5%. She has C density breast. Her mother was diagnosed with breast cancer at age 71. She has had no previous problems regarding her breast. She has had no previous problems regarding general anesthesia. She has no cardiopulmonary issues. She denies nipple discharge.   Review of Systems: A complete review of systems was obtained from the patient. I have reviewed this information and discussed as appropriate with the patient. See HPI as well for other ROS.  ROS   Medical History: Past Medical History:  Diagnosis Date  Anemia  Anxiety  Arthritis  maybe in back  Asthma, unspecified asthma severity, unspecified whether complicated, unspecified whether persistent (HHS-HCC)  Edema 2018  Essential hypertension 06/18/2015  Last Assessment & Plan: Formatting of this note might be different from the original. Well controlled, no changes to meds. Encouraged heart healthy diet such as the DASH diet and exercise as tolerated.  GERD (gastroesophageal reflux disease)  History of cancer  Hypertension 2015  Morbid obesity with BMI of 50.0-59.9, adult (CMS/HHS-HCC) 09/26/2020  Motion sickness  Poor intravenous access   Patient Active Problem List  Diagnosis  Intervertebral disc  disorder with radiculopathy of lumbar region  Endometriosis  Essential hypertension  Fatigue  Hemorrhoids  Low back pain with radiation  Stress reaction  Morbid obesity with BMI of 50.0-59.9, adult (CMS/HHS-HCC)  Edema  Preop cardiovascular exam  Morbid obesity (CMS/HHS-HCC)  Malignant neoplasm of upper-outer quadrant of left breast in female, estrogen receptor positive (CMS/HHS-HCC)   Past Surgical History:  Procedure Laterality Date  PR REMOVAL GALLBLADDER 2005  EGD N/A 12/25/2020  Procedure: ESOPHAGOGASTRODUODENOSCOPY, FLEXIBLE, TRANSORAL; DIAGNOSTIC, INCLUDING COLLECTION OF SPECIMEN(S) BY BRUSHING OR WASHING, WHEN PERFORMED (SEPARATE PROCEDURE); Surgeon: Caffie Damme, MD; Location: [facility] OR; Service: General Surgery; Laterality: N/A;  Gastric Sleeve Surgery N/A 03/14/2021  EGD N/A 03/24/2021  Procedure: ESOPHAGOGASTRODUODENOSCOPY, FLEXIBLE, TRANSORAL; DIAGNOSTIC, INCLUDING COLLECTION OF SPECIMEN(S) BY BRUSHING OR WASHING, WHEN PERFORMED (SEPARATE PROCEDURE); Surgeon: Caffie Damme, MD; Location: [facility] OR; Service: General Surgery; Laterality: N/A;  .Left Breast Biopsy Left 10/08/2023  HEMORRHOID SURGERY  over 10 years ago  OTHER SURGERY  office procedure Esure to prevent preg.    No Known Allergies  Current Outpatient Medications on File Prior to Visit  Medication Sig Dispense Refill  acetaminophen (TYLENOL) 325 MG tablet Take 650 mg by mouth every 4 (four) hours as needed for Pain  albuterol 90 mcg/actuation inhaler TAKE 2 PUFFS BY MOUTH EVERY 6 HOURS AS NEEDED FOR WHEEZE OR SHORTNESS OF BREATH  ascorbic acid, vitamin C, (VITAMIN C) 1000 MG tablet Take by mouth  b complex vitamins tablet Take 1 tablet by mouth once daily  calcium carbonate/vitamin D3 (CALCIUM 500 + D ORAL) Take by mouth  cyclobenzaprine (FLEXERIL) 10 MG tablet Take 10 mg by mouth 3 (three) times daily  as needed  ergocalciferol, vitamin D2, 1,250 mcg (50,000 unit) capsule Take 1 capsule (50,000  Units total) by mouth once a week for 60 days Take 1 capsule once a week for 8 weeks, then recheck 25(OH) vitamin D level (Patient not taking: Reported on 03/11/2021) 8 capsule 0  famotidine (PEPCID) 20 MG tablet Take 20 mg by mouth 2 (two) times daily  fluticasone propionate (FLONASE) 50 mcg/actuation nasal spray Place 2 sprays into both nostrils once daily  gabapentin (NEURONTIN) 100 MG capsule (Patient not taking: Reported on 03/26/2022)  gabapentin (NEURONTIN) 300 MG capsule Take 300 mg by mouth 3 (three) times daily (Patient not taking: Reported on 07/02/2021)  hydrocortisone (ANUSOL-HC) 25 mg suppository Place 1 suppository (25 mg total) rectally 2 (two) times daily RECOMMEND MINIMIZING USE FOR THE FIRST 30 DAYS POST-OP (Patient not taking: Reported on 07/02/2021)  ibuprofen (MOTRIN) 800 MG tablet Take 1 tablet (800 mg total) by mouth once daily Before bedtime - RECOMMEND NOT TAKING FOR 2 WEEKS POST-OP 30 tablet 0  LORazepam (ATIVAN) 0.5 MG tablet Take 0.5 mg by mouth 2 (two) times daily as needed  metoprolol succinate (TOPROL-XL) 50 MG XL tablet TAKE 1 & 1/2 TABLET BY MOUTH ONCE DAILY 135 tablet 0  multivitamin tablet Take 1 tablet by mouth once daily START TAKING YOUR BARIATRIC MULTIVITAMIN  norethindrone (MICRONOR) 0.35 mg tablet Take 1 tablet by mouth once daily  norethindrone (MICRONOR) 0.35 mg tablet Take by mouth (Patient not taking: Reported on 07/02/2021)  [DISCONTINUED] lamoTRIgine (LAMICTAL) 100 MG tablet Take 1 tablet (100 mg total) by mouth 2 (two) times daily 60 tablet 11   No current facility-administered medications on file prior to visit.   Family History  Problem Relation Age of Onset  Hyperlipidemia (Elevated cholesterol) Mother  High blood pressure (Hypertension) Mother  Breast cancer Mother  Coronary Artery Disease (Blocked arteries around heart) Father  Diabetes Father  Alcohol abuse Father  Diabetes type I Father  Heart disease Father  Hip fracture Father     Social History   Tobacco Use  Smoking Status Never  Smokeless Tobacco Never    Social History   Socioeconomic History  Marital status: Single  Tobacco Use  Smoking status: Never  Smokeless tobacco: Never  Vaping Use  Vaping status: Never Used  Substance and Sexual Activity  Alcohol use: Never  Drug use: Never  Sexual activity: Not Currently  Partners: Male  Birth control/protection: Condom, Pill, Inserts   Objective:  There were no vitals filed for this visit.  There is no height or weight on file to calculate BMI.  Physical Exam   She appears well on exam.  She has large breasts bilaterally. There is a fullness in the upper outer quadrant of the left breast near the biopsy site which may be a mass but could just be hematoma from the biopsies. There are no other breast masses in either breast  The nipple areolar complexes are normal  There is no axillary adenopathy  Labs, Imaging and Diagnostic Testing: I have reviewed the mammograms and the pathology results  Assessment and Plan:   Diagnoses and all orders for this visit:  Invasive ductal carcinoma of breast, left (CMS/HHS-HCC)  Neoplasm of left breast, primary tumor staging category Tis: ductal carcinoma in situ (DCIS)   I had a long discussion with the patient and her family regarding the diagnosis of invasive ductal carcinoma the left breast. We discussed her imaging as well as the final pathology results. From a surgical standpoint  we next discussed breast conservation versus mastectomy. Given the large size of her breast she may be a candidate for lumpectomy with a possible oncoplastic reduction versus a mastectomy. She needs an MRI of her breasts to truly determine the extent of disease given the large area of calcifications, her breast density, and her family history of breast cancer in early age. I explained the reasonings for this with her and her family. Once the results of the MRI are known we can  then determine whether or not to proceed with a lumpectomy and possible reduction versus a mastectomy. We also discussed that regardless of the surgical procedure that she would still need sentinel lymph node biopsy. I explained the surgical procedure to evaluate the lymph nodes and the reasons for this with her in detail. We discussed all the risk of surgery which includes but is not limited to bleeding, infection, injury to surrounding structures, arm numbness, arm swelling, seroma formation, the need for further surgery if margins or lymph nodes are positive, cardiopulmonary issues with anesthesia, etc. She understands and agrees with the plans. MRI will be scheduled soon as possible as well as genetics and again we will then determine how to proceed.  Addendum:  MRI shows the left breast cancer to measure up to 4.1 cm.  There are no abnormal appearing lymph nodes.  The right breast is normall.  The plan will be to proceed with a radioactive seed guided left breast lumpectomy and then oncoplastic reduction soon after with further excision if margins are positive

## 2023-11-17 ENCOUNTER — Ambulatory Visit (HOSPITAL_BASED_OUTPATIENT_CLINIC_OR_DEPARTMENT_OTHER)
Admission: RE | Admit: 2023-11-17 | Discharge: 2023-11-17 | Disposition: A | Discharge status: Home / Self Care | Payer: Medicaid Other | Attending: Surgery | Admitting: Surgery

## 2023-11-17 ENCOUNTER — Encounter (HOSPITAL_BASED_OUTPATIENT_CLINIC_OR_DEPARTMENT_OTHER): Payer: Self-pay | Admitting: Surgery

## 2023-11-17 ENCOUNTER — Other Ambulatory Visit: Payer: Self-pay

## 2023-11-17 ENCOUNTER — Encounter (HOSPITAL_BASED_OUTPATIENT_CLINIC_OR_DEPARTMENT_OTHER)
Admission: RE | Disposition: A | Discharge status: Home / Self Care | Payer: Self-pay | Source: Home / Self Care | Attending: Surgery

## 2023-11-17 ENCOUNTER — Ambulatory Visit: Payer: Medicaid Other | Admitting: Rehabilitation

## 2023-11-17 ENCOUNTER — Ambulatory Visit (HOSPITAL_BASED_OUTPATIENT_CLINIC_OR_DEPARTMENT_OTHER): Admitting: Anesthesiology

## 2023-11-17 ENCOUNTER — Ambulatory Visit
Admission: RE | Admit: 2023-11-17 | Discharge: 2023-11-17 | Disposition: A | Discharge status: Home / Self Care | Payer: Medicaid Other | Source: Ambulatory Visit | Attending: Surgery | Admitting: Surgery

## 2023-11-17 DIAGNOSIS — J45909 Unspecified asthma, uncomplicated: Secondary | ICD-10-CM | POA: Diagnosis not present

## 2023-11-17 DIAGNOSIS — R519 Headache, unspecified: Secondary | ICD-10-CM | POA: Diagnosis not present

## 2023-11-17 DIAGNOSIS — Z170 Estrogen receptor positive status [ER+]: Secondary | ICD-10-CM | POA: Diagnosis not present

## 2023-11-17 DIAGNOSIS — Z1732 Human epidermal growth factor receptor 2 negative status: Secondary | ICD-10-CM | POA: Diagnosis not present

## 2023-11-17 DIAGNOSIS — Z791 Long term (current) use of non-steroidal anti-inflammatories (NSAID): Secondary | ICD-10-CM | POA: Insufficient documentation

## 2023-11-17 DIAGNOSIS — Z803 Family history of malignant neoplasm of breast: Secondary | ICD-10-CM | POA: Insufficient documentation

## 2023-11-17 DIAGNOSIS — I10 Essential (primary) hypertension: Secondary | ICD-10-CM | POA: Insufficient documentation

## 2023-11-17 DIAGNOSIS — Z6837 Body mass index (BMI) 37.0-37.9, adult: Secondary | ICD-10-CM | POA: Insufficient documentation

## 2023-11-17 DIAGNOSIS — Z79899 Other long term (current) drug therapy: Secondary | ICD-10-CM | POA: Diagnosis not present

## 2023-11-17 DIAGNOSIS — F32A Depression, unspecified: Secondary | ICD-10-CM | POA: Diagnosis not present

## 2023-11-17 DIAGNOSIS — F419 Anxiety disorder, unspecified: Secondary | ICD-10-CM | POA: Diagnosis not present

## 2023-11-17 DIAGNOSIS — C50912 Malignant neoplasm of unspecified site of left female breast: Secondary | ICD-10-CM

## 2023-11-17 DIAGNOSIS — K219 Gastro-esophageal reflux disease without esophagitis: Secondary | ICD-10-CM | POA: Diagnosis not present

## 2023-11-17 DIAGNOSIS — Z01818 Encounter for other preprocedural examination: Secondary | ICD-10-CM

## 2023-11-17 DIAGNOSIS — Z1721 Progesterone receptor positive status: Secondary | ICD-10-CM | POA: Insufficient documentation

## 2023-11-17 DIAGNOSIS — C50412 Malignant neoplasm of upper-outer quadrant of left female breast: Secondary | ICD-10-CM | POA: Insufficient documentation

## 2023-11-17 DIAGNOSIS — Z853 Personal history of malignant neoplasm of breast: Secondary | ICD-10-CM

## 2023-11-17 HISTORY — PX: BREAST LUMPECTOMY WITH RADIOACTIVE SEED AND SENTINEL LYMPH NODE BIOPSY: SHX6550

## 2023-11-17 LAB — CYTOLOGY - PAP
Comment: NEGATIVE
Diagnosis: NEGATIVE
High risk HPV: NEGATIVE

## 2023-11-17 LAB — POCT PREGNANCY, URINE: Preg Test, Ur: NEGATIVE

## 2023-11-17 SURGERY — BREAST LUMPECTOMY WITH RADIOACTIVE SEED AND SENTINEL LYMPH NODE BIOPSY
Anesthesia: Regional | Site: Breast | Laterality: Left

## 2023-11-17 MED ORDER — TRAMADOL HCL 50 MG PO TABS: 50.0000 mg | ORAL_TABLET | Freq: Four times a day (QID) | ORAL | 0 refills | Status: AC | PRN

## 2023-11-17 MED ORDER — MIDAZOLAM HCL 2 MG/2ML IJ SOLN
INTRAMUSCULAR | Status: AC
  Filled 2023-11-17: qty 2

## 2023-11-17 MED ORDER — ACETAMINOPHEN 500 MG PO TABS
ORAL_TABLET | ORAL | Status: AC
  Filled 2023-11-17: qty 2

## 2023-11-17 MED ORDER — LIDOCAINE 2% (20 MG/ML) 5 ML SYRINGE
INTRAMUSCULAR | Status: AC
  Filled 2023-11-17: qty 5

## 2023-11-17 MED ORDER — CEFAZOLIN SODIUM-DEXTROSE 2-4 GM/100ML-% IV SOLN
2.0000 g | INTRAVENOUS | Status: AC
  Administered 2023-11-17: 2 g via INTRAVENOUS

## 2023-11-17 MED ORDER — BUPIVACAINE-EPINEPHRINE (PF) 0.5% -1:200000 IJ SOLN
INTRAMUSCULAR | Status: AC
  Filled 2023-11-17: qty 30

## 2023-11-17 MED ORDER — FENTANYL CITRATE (PF) 100 MCG/2ML IJ SOLN
INTRAMUSCULAR | Status: AC
  Filled 2023-11-17: qty 2

## 2023-11-17 MED ORDER — ONDANSETRON HCL 4 MG/2ML IJ SOLN
INTRAMUSCULAR | Status: AC
  Filled 2023-11-17: qty 2

## 2023-11-17 MED ORDER — OXYCODONE HCL 5 MG PO TABS
5.0000 mg | ORAL_TABLET | Freq: Once | ORAL | Status: AC | PRN
  Administered 2023-11-17: 5 mg via ORAL

## 2023-11-17 MED ORDER — DEXAMETHASONE SODIUM PHOSPHATE 10 MG/ML IJ SOLN
INTRAMUSCULAR | Status: AC
  Filled 2023-11-17: qty 1

## 2023-11-17 MED ORDER — ACETAMINOPHEN 500 MG PO TABS
1000.0000 mg | ORAL_TABLET | ORAL | Status: AC
  Administered 2023-11-17: 1000 mg via ORAL

## 2023-11-17 MED ORDER — EPHEDRINE SULFATE-NACL 50-0.9 MG/10ML-% IV SOSY
PREFILLED_SYRINGE | INTRAVENOUS | Status: DC | PRN
  Administered 2023-11-17: 10 mg via INTRAVENOUS
  Administered 2023-11-17: 15 mg via INTRAVENOUS

## 2023-11-17 MED ORDER — OXYCODONE HCL 5 MG PO TABS
ORAL_TABLET | ORAL | Status: AC
  Filled 2023-11-17: qty 1

## 2023-11-17 MED ORDER — AMISULPRIDE (ANTIEMETIC) 5 MG/2ML IV SOLN
10.0000 mg | Freq: Once | INTRAVENOUS | Status: AC | PRN
  Administered 2023-11-17: 10 mg via INTRAVENOUS

## 2023-11-17 MED ORDER — BUPIVACAINE-EPINEPHRINE 0.5% -1:200000 IJ SOLN
INTRAMUSCULAR | Status: DC | PRN
  Administered 2023-11-17: 20 mL

## 2023-11-17 MED ORDER — FENTANYL CITRATE (PF) 100 MCG/2ML IJ SOLN: 25.0000 ug | INTRAMUSCULAR | Status: DC | PRN

## 2023-11-17 MED ORDER — MIDAZOLAM HCL 2 MG/2ML IJ SOLN
2.0000 mg | Freq: Once | INTRAMUSCULAR | Status: AC
  Administered 2023-11-17: 2 mg via INTRAVENOUS

## 2023-11-17 MED ORDER — PROPOFOL 10 MG/ML IV BOLUS
INTRAVENOUS | Status: DC | PRN
  Administered 2023-11-17: 100 mg via INTRAVENOUS
  Administered 2023-11-17: 200 mg via INTRAVENOUS

## 2023-11-17 MED ORDER — DEXAMETHASONE SODIUM PHOSPHATE 10 MG/ML IJ SOLN
INTRAMUSCULAR | Status: DC | PRN
  Administered 2023-11-17: 10 mg

## 2023-11-17 MED ORDER — PHENYLEPHRINE 80 MCG/ML (10ML) SYRINGE FOR IV PUSH (FOR BLOOD PRESSURE SUPPORT)
PREFILLED_SYRINGE | INTRAVENOUS | Status: DC | PRN
  Administered 2023-11-17 (×5): 160 ug via INTRAVENOUS
  Administered 2023-11-17: 80 ug via INTRAVENOUS

## 2023-11-17 MED ORDER — ONDANSETRON HCL 4 MG/2ML IJ SOLN
INTRAMUSCULAR | Status: DC | PRN
  Administered 2023-11-17: 4 mg via INTRAVENOUS

## 2023-11-17 MED ORDER — LIDOCAINE 2% (20 MG/ML) 5 ML SYRINGE
INTRAMUSCULAR | Status: DC | PRN
  Administered 2023-11-17: 20 mg via INTRAVENOUS

## 2023-11-17 MED ORDER — 0.9 % SODIUM CHLORIDE (POUR BTL) OPTIME
TOPICAL | Status: DC | PRN
  Administered 2023-11-17: 500 mL

## 2023-11-17 MED ORDER — CEFAZOLIN SODIUM-DEXTROSE 2-4 GM/100ML-% IV SOLN
INTRAVENOUS | Status: AC
  Filled 2023-11-17: qty 100

## 2023-11-17 MED ORDER — FENTANYL CITRATE (PF) 100 MCG/2ML IJ SOLN
50.0000 ug | Freq: Once | INTRAMUSCULAR | Status: AC
  Administered 2023-11-17: 50 ug via INTRAVENOUS

## 2023-11-17 MED ORDER — MAGTRACE LYMPHATIC TRACER
INTRAMUSCULAR | Status: DC | PRN
  Administered 2023-11-17: 2 mL via INTRAMUSCULAR

## 2023-11-17 MED ORDER — DEXMEDETOMIDINE HCL IN NACL 80 MCG/20ML IV SOLN
INTRAVENOUS | Status: DC | PRN
  Administered 2023-11-17: 8 ug via INTRAVENOUS

## 2023-11-17 MED ORDER — OXYCODONE HCL 5 MG/5ML PO SOLN: 5.0000 mg | Freq: Once | ORAL | Status: AC | PRN

## 2023-11-17 MED ORDER — AMISULPRIDE (ANTIEMETIC) 5 MG/2ML IV SOLN
INTRAVENOUS | Status: AC
  Filled 2023-11-17: qty 4

## 2023-11-17 MED ORDER — FENTANYL CITRATE (PF) 100 MCG/2ML IJ SOLN
INTRAMUSCULAR | Status: DC | PRN
  Administered 2023-11-17 (×4): 25 ug via INTRAVENOUS

## 2023-11-17 MED ORDER — LACTATED RINGERS IV SOLN: INTRAVENOUS | Status: DC

## 2023-11-17 MED ORDER — KETOROLAC TROMETHAMINE 30 MG/ML IJ SOLN
INTRAMUSCULAR | Status: AC
  Filled 2023-11-17: qty 1

## 2023-11-17 MED ORDER — ROPIVACAINE HCL 5 MG/ML IJ SOLN
INTRAMUSCULAR | Status: DC | PRN
  Administered 2023-11-17: 30 mL via PERINEURAL

## 2023-11-17 SURGICAL SUPPLY — 37 items
APPLIER CLIP 9.375 MED OPEN (MISCELLANEOUS) ×1 IMPLANT
BLADE SURG 15 STRL LF DISP TIS (BLADE) ×1 IMPLANT
CANISTER SUCT 1200ML W/VALVE (MISCELLANEOUS) IMPLANT
CHLORAPREP W/TINT 26 (MISCELLANEOUS) ×1 IMPLANT
CLIP APPLIE 9.375 MED OPEN (MISCELLANEOUS) ×1 IMPLANT
CLIP TI WIDE RED SMALL 6 (CLIP) IMPLANT
COVER BACK TABLE 60X90IN (DRAPES) ×1 IMPLANT
COVER MAYO STAND STRL (DRAPES) ×1 IMPLANT
COVER PROBE CYLINDRICAL 5X96 (MISCELLANEOUS) ×1 IMPLANT
DERMABOND ADVANCED .7 DNX12 (GAUZE/BANDAGES/DRESSINGS) ×1 IMPLANT
DRAPE LAPAROSCOPIC ABDOMINAL (DRAPES) ×1 IMPLANT
DRAPE UTILITY XL STRL (DRAPES) ×1 IMPLANT
ELECT REM PT RETURN 9FT ADLT (ELECTROSURGICAL) ×1 IMPLANT
ELECTRODE REM PT RTRN 9FT ADLT (ELECTROSURGICAL) ×1 IMPLANT
GAUZE SPONGE 4X4 12PLY STRL LF (GAUZE/BANDAGES/DRESSINGS) IMPLANT
GLOVE SURG SIGNA 7.5 PF LTX (GLOVE) ×1 IMPLANT
GOWN STRL REUS W/ TWL LRG LVL3 (GOWN DISPOSABLE) ×1 IMPLANT
GOWN STRL REUS W/ TWL XL LVL3 (GOWN DISPOSABLE) ×1 IMPLANT
KIT MARKER MARGIN INK (KITS) ×1 IMPLANT
NDL HYPO 25X1 1.5 SAFETY (NEEDLE) ×1 IMPLANT
NDL SAFETY ECLIPSE 18X1.5 (NEEDLE) ×1 IMPLANT
NEEDLE HYPO 25X1 1.5 SAFETY (NEEDLE) ×1 IMPLANT
NS IRRIG 1000ML POUR BTL (IV SOLUTION) ×1 IMPLANT
PACK BASIN DAY SURGERY FS (CUSTOM PROCEDURE TRAY) ×1 IMPLANT
PENCIL SMOKE EVACUATOR (MISCELLANEOUS) ×1 IMPLANT
SLEEVE SCD COMPRESS KNEE MED (STOCKING) ×1 IMPLANT
SPIKE FLUID TRANSFER (MISCELLANEOUS) IMPLANT
SPONGE T-LAP 4X18 ~~LOC~~+RFID (SPONGE) ×1 IMPLANT
SUT MNCRL AB 4-0 PS2 18 (SUTURE) ×1 IMPLANT
SUT SILK 2 0 SH (SUTURE) IMPLANT
SUT VIC AB 3-0 SH 27X BRD (SUTURE) ×1 IMPLANT
SYR CONTROL 10ML LL (SYRINGE) ×1 IMPLANT
TOWEL GREEN STERILE FF (TOWEL DISPOSABLE) ×1 IMPLANT
TRACER MAGTRACE VIAL (MISCELLANEOUS) IMPLANT
TRAY FAXITRON CT DISP (TRAY / TRAY PROCEDURE) ×1 IMPLANT
TUBE CONNECTING 20X1/4 (TUBING) IMPLANT
YANKAUER SUCT BULB TIP NO VENT (SUCTIONS) IMPLANT

## 2023-11-17 NOTE — Transfer of Care (Signed)
 Immediate Anesthesia Transfer of Care Note  Patient: Jody [name]  Procedure(s) Performed: LEFT BREAST SEED GUIDED BRACKETED LUMPECTOMY AND SENTINEL LYMPH NODE BIOPSY (Left: Breast)  Patient Location: PACU  Anesthesia Type:General  Level of Consciousness: awake, alert , and oriented  Airway & Oxygen Therapy: Patient Spontanous Breathing and Patient connected to face mask oxygen  Post-op Assessment: Report given to RN and Post -op Vital signs reviewed and stable  Post vital signs: Reviewed and stable  Last Vitals:  Vitals Value Taken Time  BP 112/87 11/17/23 1307  Temp    Pulse 105 11/17/23 1309  Resp 14 11/17/23 1309  SpO2 91 % 11/17/23 1309  Vitals shown include unfiled device data.  Last Pain:  Vitals:   11/17/23 1028  TempSrc: Temporal  PainSc: 0-No pain      Patients Stated Pain Goal: 3 (11/17/23 1028)  Complications: No notable events documented.

## 2023-11-17 NOTE — Anesthesia Postprocedure Evaluation (Signed)
 Anesthesia Post Note  Patient: Forensic scientist  Procedure(s) Performed: LEFT BREAST SEED GUIDED BRACKETED LUMPECTOMY AND SENTINEL LYMPH NODE BIOPSY (Left: Breast)     Patient location during evaluation: PACU Anesthesia Type: Regional and General Level of consciousness: awake and alert Pain management: pain level controlled Vital Signs Assessment: post-procedure vital signs reviewed and stable Respiratory status: spontaneous breathing, nonlabored ventilation, respiratory function stable and patient connected to nasal cannula oxygen Cardiovascular status: blood pressure returned to baseline and stable Postop Assessment: no apparent nausea or vomiting Anesthetic complications: no  No notable events documented.  Last Vitals:  Vitals:   11/17/23 1345 11/17/23 1410  BP:  118/74  Pulse: 90 83  Resp: 20 18  Temp:  (!) 36.2 C  SpO2: 97% 100%    Last Pain:  Vitals:   11/17/23 1410  TempSrc:   PainSc: 5                  Giavonna Pflum L Kaelan Amble

## 2023-11-17 NOTE — Progress Notes (Signed)
Assisted Dr. Woodrum with left, pectoralis, ultrasound guided block. Side rails up, monitors on throughout procedure. See vital signs in flow sheet. Tolerated Procedure well. 

## 2023-11-17 NOTE — Interval H&P Note (Signed)
 History and Physical Interval Note: no change in H and P  11/17/2023 10:32 AM  Jody [name]  has presented today for surgery, with the diagnosis of LEFT BREAST CANCER.  The various methods of treatment have been discussed with the patient and family. After consideration of risks, benefits and other options for treatment, the patient has consented to  Procedure(s): LEFT BREAST SEED GUIDED BRACKETED LUMPECTOMY AND SENTINEL LYMPH NODE BIOPSY (Left) as a surgical intervention.  The patient's history has been reviewed, patient examined, no change in status, stable for surgery.  I have reviewed the patient's chart and labs.  Questions were answered to the patient's satisfaction.     [name]

## 2023-11-17 NOTE — Progress Notes (Signed)
 Last received oxycodone at 2:15pm written on dc paperwork.

## 2023-11-17 NOTE — Discharge Instructions (Addendum)
 [facility] Office Phone Number [phone]  BREAST BIOPSY/ PARTIAL MASTECTOMY: POST OP INSTRUCTIONS  Always review your discharge instruction sheet given to you by the facility where your surgery was performed.  IF YOU HAVE DISABILITY OR FAMILY LEAVE FORMS, YOU MUST BRING THEM TO THE OFFICE FOR PROCESSING.  DO NOT GIVE THEM TO YOUR DOCTOR.  A prescription for pain medication may be given to you upon discharge.  Take your pain medication as prescribed, if needed.  If narcotic pain medicine is not needed, then you may take acetaminophen (Tylenol) or ibuprofen (Advil) as needed. Take your usually prescribed medications unless otherwise directed If you need a refill on your pain medication, please contact your pharmacy.  They will contact our office to request authorization.  Prescriptions will not be filled after 5pm or on week-ends. You should eat very light the first 24 hours after surgery, such as soup, crackers, pudding, etc.  Resume your normal diet the day after surgery. Most patients will experience some swelling and bruising in the breast.  Ice packs and a good support bra will help.  Swelling and bruising can take several days to resolve.  It is common to experience some constipation if taking pain medication after surgery.  Increasing fluid intake and taking a stool softener will usually help or prevent this problem from occurring.  A mild laxative (Milk of Magnesia or Miralax) should be taken according to package directions if there are no bowel movements after 48 hours. Unless discharge instructions indicate otherwise, you may remove your bandages 24-48 hours after surgery, and you may shower at that time.  You may have steri-strips (small skin tapes) in place directly over the incision.  These strips should be left on the skin for 7-10 days.  If your surgeon used skin glue on the incision, you may shower in 24 hours.  The glue will flake off over the next 2-3 weeks.  Any  sutures or staples will be removed at the office during your follow-up visit. ACTIVITIES:  You may resume regular daily activities (gradually increasing) beginning the next day.  Wearing a good support bra or sports bra minimizes pain and swelling.  You may have sexual intercourse when it is comfortable. You may drive when you no longer are taking prescription pain medication, you can comfortably wear a seatbelt, and you can safely maneuver your car and apply brakes. RETURN TO WORK:  ______________________________________________________________________________________ Jody [name] should see your doctor in the office for a follow-up appointment approximately two weeks after your surgery.  Your doctor's nurse will typically make your follow-up appointment when she calls you with your pathology report.  Expect your pathology report 2-3 business days after your surgery.  You may call to check if you do not hear from [place] after three days. OTHER INSTRUCTIONS: YOU MAY REMOVE THE BINDER AND SHOWER STARTING TOMORROW ICE PACK, TYLENOL, AND IBUPROFEN ALSO FOR PAIN NO VIGOROUS ACTIVITY FOR ONE WEEK _____________________________________________________________________________________________________________________________________ _____________________________________________________________________________________________________________________________________ _____________________________________________________________________________________________________________________________________  WHEN TO CALL YOUR DOCTOR: Fever over 101.0 Nausea and/or vomiting. Extreme swelling or bruising. Continued bleeding from incision. Increased pain, redness, or drainage from the incision.  The clinic staff is available to answer your questions during regular business hours.  Please don't hesitate to call and ask to speak to one of the nurses for clinical concerns.  If you have a medical emergency, go to the nearest emergency room  or call 911.  A surgeon from [facility] is always on call at the hospital.  For further  questions, please visit [email or web address]    Post Anesthesia Home Care Instructions  Activity: Get plenty of rest for the remainder of the day. A responsible individual must stay with you for 24 hours following the procedure.  For the next 24 hours, DO NOT: -Drive a car -Advertising copywriter -Drink alcoholic beverages -Take any medication unless instructed by your physician -Make any legal decisions or sign important papers.  Meals: Start with liquid foods such as gelatin or soup. Progress to regular foods as tolerated. Avoid greasy, spicy, heavy foods. If nausea and/or vomiting occur, drink only clear liquids until the nausea and/or vomiting subsides. Call your physician if vomiting continues.  Special Instructions/Symptoms: Your throat may feel dry or sore from the anesthesia or the breathing tube placed in your throat during surgery. If this causes discomfort, gargle with warm salt water. The discomfort should disappear within 24 hours.  If you had a scopolamine patch placed behind your ear for the management of post- operative nausea and/or vomiting:  1. The medication in the patch is effective for 72 hours, after which it should be removed.  Wrap patch in a tissue and discard in the trash. Wash hands thoroughly with soap and water. 2. You may remove the patch earlier than 72 hours if you experience unpleasant side effects which may include dry mouth, dizziness or visual disturbances. 3. Avoid touching the patch. Wash your hands with soap and water after contact with the patch.  No tylenol until after 4:30pm today if needed.

## 2023-11-17 NOTE — Anesthesia Procedure Notes (Signed)
 Procedure Name: LMA Insertion Date/Time: 11/17/2023 12:19 PM  Performed by: Yolanda Bonine, CRNAPre-anesthesia Checklist: Patient identified, Emergency Drugs available, Suction available, Patient being monitored and Timeout performed Patient Re-evaluated:Patient Re-evaluated prior to induction Oxygen Delivery Method: Circle system utilized Preoxygenation: Pre-oxygenation with 100% oxygen Induction Type: IV induction Ventilation: Mask ventilation without difficulty LMA: LMA inserted LMA Size: 4.0 Placement Confirmation: positive ETCO2, CO2 detector and breath sounds checked- equal and bilateral Tube secured with: Tape Dental Injury: Teeth and Oropharynx as per pre-operative assessment

## 2023-11-17 NOTE — Op Note (Signed)
 Jody [name] 11/17/2023   Pre-op Diagnosis: LEFT BREAST CANCER     Post-op Diagnosis: same  Procedure(s): BRACKETED RADIOACTIVE SEED GUIDED LEFT BREAST LUMPECTOMY DEEP LEFT AXILLARY SENTINEL LYMPH NODE BIOPSY INJECTION OF MAG TRACE FOR LYMPH NODE MAPPING  Surgeon(s): [name], MD Maczis, Hedda Slade, PA-C  Anesthesia: General  Staff:  Circulator: Griffin Basil, RN Scrub Person: Maryan Rued, RN Circulator Assistant: Mora Bellman, RN  Estimated Blood Loss: Minimal               Specimens: SENT TO PATH  Indications: This is a 53 year old female was found to have a large year for calcifications and a mass in the outer quadrant of the left breast to screen mammography.  She underwent 2 separate biopsies with 1 showing invasive ductal carcinoma with DCIS and the other being benign but felt to be discordant.  She underwent MRI showing the total area was greater than 4 cm in size.  After discussion with the patient and her family we have decided proceed with a bracketed radioactive seed guided left breast lumpectomy and sentinel node biopsy which will then be followed by an oncoplastic reduction of her breast by plastic surgery.  Procedure: The patient was brought to the operating room and identified the correct patient.  She was placed upon on the operating table general anesthesia was induced.  I next under sterile technique injected mag trace underneath the left nipple areolar complex and massaged the breast.  Her left breast and axilla were then prepped and draped in the usual sterile fashion.  Using the neoprobe we located the radioactive seeds in the upper outer quadrant of the left breast.  I anesthetized skin with Marcaine and made a longitudinal incision with a scalpel.  I then dissected down to the breast tissue with electrocautery.  With the aid of the neoprobe I then performed a wide lumpectomy attempting to stay widely around both signal from the 2  separate radioactive seeds bracketing the malignancy.  I took this down to near the chest wall and then superior and inferiorly until reaching the lateral aspect of the area.  I then grasped the specimen with Allis clamps and then completed lumpectomy staying underneath the signals.  I marked all margins with paint.  An x-ray was performed on the specimen confirmed the 2 radioactive seeds and one of the biopsy clips within the specimen.  The other biopsy clip was known to have migrated out of the area and was not expected to be in the lumpectomy.  The lumpectomy was sent to pathology for evaluation.  Through this same breast incision I was then able to dissect into the deep axilla.  With the aid of the mag trace probe identified several deep left axillary lymph nodes.  I grasped these with Allis clamps and then using cautery and surgical clips was able to remove the lymph nodes en bloc together with the surrounding fat.  The lymph nodes were then sent to pathology for evaluation as well.  Hemostasis was achieved with the cautery.  I did place surgical clips around the periphery of the lumpectomy cavity.  I injected further Marcaine into the lumpectomy site.  We then closed the subcutaneous tissue with interrupted 3-0 Vicryl sutures and closed the skin with a running 4-0 Monocryl.  Dermabond was then applied.  The patient tolerated the procedure well.  All the counts were correct at the end of the procedure.  The patient was then placed in a  breast binder.  She was then extubated in the operating room and taken in stable condition to the recovery room.          [name]   Date: 11/17/2023  Time: 12:48 PM

## 2023-11-17 NOTE — Anesthesia Procedure Notes (Signed)
 Anesthesia Regional Block: Pectoralis block   Pre-Anesthetic Checklist: , timeout performed,  Correct Patient, Correct Site, Correct Laterality,  Correct Procedure, Correct Position, site marked,  Risks and benefits discussed,  Pre-op evaluation,  At surgeon's request and post-op pain management  Laterality: Left  Prep: Maximum Sterile Barrier Precautions used, chloraprep       Needles:  Injection technique: Single-shot  Needle Type: Echogenic Stimulator Needle     Needle Length: 9cm  Needle Gauge: 21     Additional Needles:   Procedures:,,,, ultrasound used (permanent image in chart),,    Narrative:  Start time: 11/17/2023 10:42 AM End time: 11/17/2023 10:45 AM Injection made incrementally with aspirations every 5 mL. Anesthesiologist: Elmer Picker, MD

## 2023-11-17 NOTE — Anesthesia Preprocedure Evaluation (Addendum)
 Anesthesia Evaluation  Patient identified by MRN, date of birth, ID band Patient awake    Reviewed: Allergy & Precautions, NPO status , Patient's Chart, lab work & pertinent test results, reviewed documented beta blocker date and time   Airway Mallampati: II  TM Distance: >3 FB Neck ROM: Full    Dental  (+) Teeth Intact, Dental Advisory Given   Pulmonary asthma    Pulmonary exam normal breath sounds clear to auscultation       Cardiovascular hypertension, Pt. on home beta blockers and Pt. on medications Normal cardiovascular exam Rhythm:Regular Rate:Normal     Neuro/Psych  Headaches PSYCHIATRIC DISORDERS Anxiety Depression       GI/Hepatic Neg liver ROS,GERD  ,,  Endo/Other  negative endocrine ROS    Renal/GU negative Renal ROS  negative genitourinary   Musculoskeletal negative musculoskeletal ROS (+)    Abdominal   Peds  Hematology negative hematology ROS (+)   Anesthesia Other Findings   Reproductive/Obstetrics                             Anesthesia Physical Anesthesia Plan  ASA: 2  Anesthesia Plan: General and Regional   Post-op Pain Management: Regional block* and Tylenol PO (pre-op)*   Induction: Intravenous  PONV Risk Score and Plan: 3 and Ondansetron, Dexamethasone and Midazolam  Airway Management Planned: LMA  Additional Equipment:   Intra-op Plan:   Post-operative Plan: Extubation in OR  Informed Consent: I have reviewed the patients History and Physical, chart, labs and discussed the procedure including the risks, benefits and alternatives for the proposed anesthesia with the patient or authorized representative who has indicated his/her understanding and acceptance.     Dental advisory given  Plan Discussed with: CRNA  Anesthesia Plan Comments:        Anesthesia Quick Evaluation

## 2023-11-18 ENCOUNTER — Encounter (HOSPITAL_BASED_OUTPATIENT_CLINIC_OR_DEPARTMENT_OTHER): Payer: Self-pay | Admitting: Surgery

## 2023-11-22 LAB — SURGICAL PATHOLOGY

## 2023-11-23 ENCOUNTER — Encounter: Payer: Self-pay | Admitting: *Deleted

## 2023-11-23 ENCOUNTER — Telehealth: Payer: Self-pay | Admitting: *Deleted

## 2023-11-23 ENCOUNTER — Other Ambulatory Visit: Payer: Self-pay | Admitting: Surgery

## 2023-11-23 NOTE — Telephone Encounter (Signed)
 Received order for Oncotype Testing per Dr. Mosetta Putt. Requisition faxed to pathology and [facility]

## 2023-11-29 ENCOUNTER — Inpatient Hospital Stay: Attending: Hematology | Admitting: Licensed Clinical Social Worker

## 2023-11-29 DIAGNOSIS — C50412 Malignant neoplasm of upper-outer quadrant of left female breast: Secondary | ICD-10-CM

## 2023-11-29 NOTE — Progress Notes (Signed)
 [facility] CSW Progress Note  Clinical Child psychotherapist  returned pt's call regarding grants.  Pt reports she attempted to apply for the [employer] but on the website they are not accepting new applications.  CSW sent an email on behalf of pt to [place] inquiring when grants may open again.  CSW to follow up w/ pt once response is received.  CSW encouraged pt to complete and submit the [employer] in the interim.        Rachel Moulds, LCSW Clinical Social Worker [facility]    Patient is participating in a Managed Medicaid Plan:  Yes

## 2023-12-01 ENCOUNTER — Encounter (HOSPITAL_COMMUNITY): Payer: Self-pay

## 2023-12-02 NOTE — H&P (View-Only) (Signed)
 Patient ID: Jody [name], female    DOB: Nov 03, 1970, 53 y.o.   MRN: [id number]  No chief complaint on file.   No diagnosis found.   History of Present Illness: Jody [name] is a 53 y.o.  female  with a history of ***.  She presents for preoperative evaluation for upcoming procedure, ***, scheduled for *** with Dr. {[id number]::"EXBM","[id number]"}.  The patient {HAS HAS UVO:53664} had problems with anesthesia. ***  Summary of Previous Visit: ***  Job: ***  PMH Significant for: ***   Past Medical History: Allergies: No Known Allergies  Current Medications:  Current Outpatient Medications:    albuterol (VENTOLIN HFA) 108 (90 Base) MCG/ACT inhaler, INHALE 2 PUFFS 4 TIMES A DAY AS NEEDED, Disp: 18 each, Rfl: 1   famotidine (PEPCID) 20 MG tablet, Take 1 tablet (20 mg total) by mouth 2 (two) times daily., Disp: 60 tablet, Rfl: 11   fluticasone (FLONASE) 50 MCG/ACT nasal spray, SPRAY 2 SPRAYS INTO EACH NOSTRIL EVERY DAY, Disp: 48 mL, Rfl: 1   hydrocortisone (ANUSOL-HC) 25 MG suppository, Place 1 suppository (25 mg total) rectally 2 (two) times daily as needed for hemorrhoids or anal itching., Disp: 24 suppository, Rfl: 0   ibuprofen (ADVIL) 800 MG tablet, Take 800 mg by mouth every 6 (six) hours as needed., Disp: , Rfl:    LORazepam (ATIVAN) 1 MG tablet, Take 0.5 tablets (0.5 mg total) by mouth daily as needed for anxiety., Disp: 30 tablet, Rfl: 0   metoprolol tartrate (LOPRESSOR) 50 MG tablet, Take 1 tablet (50 mg total) by mouth 2 (two) times daily., Disp: 180 tablet, Rfl: 1   Multiple Vitamin (MULTI VITAMIN PO), Take by mouth., Disp: , Rfl:    traMADol (ULTRAM) 50 MG tablet, Take 1 tablet (50 mg total) by mouth every 6 (six) hours as needed., Disp: 20 tablet, Rfl: 0  Past Medical Problems: Past Medical History:  Diagnosis Date   Anemia    Anxiety    Asthma    Breast cancer (HCC)    Depression    GERD (gastroesophageal reflux disease)    History of  anal fissures    History of gallstones    Hypertension    Migraine headache with aura    Migraines    without aura   Obesity     Past Surgical History: Past Surgical History:  Procedure Laterality Date   BREAST BIOPSY Left 10/08/2023   MM LT BREAST BX W LOC DEV 1ST LESION IMAGE BX SPEC STEREO GUIDE 10/08/2023 [facility]   BREAST BIOPSY Left 10/08/2023   MM LT BREAST BX W LOC DEV EA AD LESION IMG BX SPEC STEREO GUIDE 10/08/2023 [facility]   BREAST BIOPSY  11/16/2023   MM LT RADIOACTIVE SEED LOC MAMMO GUIDE 11/16/2023 [facility]   BREAST BIOPSY  11/16/2023   MM LT RADIOACTIVE SEED EA ADD LESION LOC MAMMO GUIDE 11/16/2023 [facility]   BREAST LUMPECTOMY WITH RADIOACTIVE SEED AND SENTINEL LYMPH NODE BIOPSY Left 11/17/2023   Procedure: LEFT BREAST SEED GUIDED BRACKETED LUMPECTOMY AND SENTINEL LYMPH NODE BIOPSY;  Surgeon: [name], MD;  Location: [facility];  Service: General;  Laterality: Left;   CHOLECYSTECTOMY     ESOPHAGOGASTRODUODENOSCOPY     FLEXIBLE SIGMOIDOSCOPY  03/09/2012   Procedure: FLEXIBLE SIGMOIDOSCOPY;  Surgeon: Louis Meckel, MD;  Location: [facility];  Service: Endoscopy;  Laterality: N/A;   gastric sleeve surgery  03/14/2021   TUBAL LIGATION     [facility]  Social History: Social History   Socioeconomic History   Marital status: Single    Spouse name: Not on file   Number of children: 3   Years of education: Not on file   Highest education level: Not on file  Occupational History   Occupation: disability    Employer: [employer]  Tobacco Use   Smoking status: Never   Smokeless tobacco: Never  Vaping Use   Vaping status: Never Used  Substance and Sexual Activity   Alcohol use: No   Drug use: No   Sexual activity: Not Currently    Partners: Male    Comment: [facility]  Other Topics Concern   Not on file  Social History Narrative   Exercise-- walking 30 min daily   Social Drivers of Health    Financial Resource Strain: High Risk (10/26/2023)   Overall Financial Resource Strain (CARDIA)    Difficulty of Paying Living Expenses: Very hard  Food Insecurity: No Food Insecurity (10/22/2023)   Hunger Vital Sign    Worried About Running Out of Food in the Last Year: Never true    Ran Out of Food in the Last Year: Never true  Transportation Needs: Unmet Transportation Needs (10/22/2023)   PRAPARE - Administrator, Civil Service (Medical): Yes    Lack of Transportation (Non-Medical): Yes  Physical Activity: Not on file  Stress: Not on file  Social Connections: Not on file  Intimate Partner Violence: Not At Risk (10/22/2023)   Humiliation, Afraid, Rape, and Kick questionnaire    Fear of Current or Ex-Partner: No    Emotionally Abused: No    Physically Abused: No    Sexually Abused: No    Family History: Family History  Problem Relation Age of Onset   Breast cancer Mother 31       no genetic testing   Hypertension Mother    Hepatitis Father 55       Hep C   Diabetes Father    Arthritis Father    Hypertension Father    Kidney disease Father    Heart attack Father 69   Breast cancer Maternal Aunt        dx late 15s; mother's mat half sister   Cancer Maternal Uncle        x2 mat uncles; unk type; dx >50   Coronary artery disease Maternal Grandmother    Cataracts Maternal Grandmother    Glaucoma Maternal Grandmother    Heart disease Maternal Grandmother    Colon cancer Paternal Grandmother        dx >50   Obesity Paternal Grandmother    Cancer Cousin        unk type; dx 75s; mat female cousin   Obesity Other    Stomach cancer Neg Hx    Rectal cancer Neg Hx     Review of Systems: ROS  Physical Exam: Vital Signs LMP 10/30/2023   Physical Exam *** Constitutional:      General: Not in acute distress.    Appearance: Normal appearance. Not ill-appearing.  HENT:     Head: Normocephalic and atraumatic.  Eyes:     Pupils: Pupils are equal,  round. Cardiovascular:     Rate and Rhythm: Normal rate.    Pulses: Normal pulses.  Pulmonary:     Effort: No respiratory distress or increased work of breathing.  Speaks in full sentences. Abdominal:     General: Abdomen is flat. No distension.   Musculoskeletal: Normal range of  motion. No lower extremity swelling or edema. No varicosities. *** Skin:    General: Skin is warm and dry.     Findings: No erythema or rash.  Neurological:     Mental Status: Alert and oriented to person, place, and time.  Psychiatric:        Mood and Affect: Mood normal.        Behavior: Behavior normal.    Assessment/Plan: The patient is scheduled for *** with Dr. Jenean Lindau.  Risks, benefits, and alternatives of procedure discussed, questions answered and consent obtained.    Smoking Status: ***; Counseling Given? *** Last Mammogram: ***; Results: ***  Caprini Score: ***; Risk Factors include: ***, BMI *** 25, and length of planned surgery. Recommendation for mechanical *** pharmacological prophylaxis. Encourage early ambulation.   Pictures obtained: ***  Post-op Rx sent to pharmacy: ***  Patient was provided with the *** General Surgical Risk consent document and Pain Medication Agreement prior to their appointment.  They had adequate time to read through the risk consent documents and Pain Medication Agreement. We also discussed them in person together during this preop appointment. All of their questions were answered to their satisfaction.  Recommended calling if they have any further questions.  Risk consent form and Pain Medication Agreement to be scanned into patient's chart.  ***   Electronically signed by: Evelena Leyden, PA-C 12/02/2023 3:46 PM

## 2023-12-02 NOTE — Progress Notes (Signed)
 Patient ID: Jody [name], female    DOB: Nov 03, 1970, 53 y.o.   MRN: [id number]  No chief complaint on file.   No diagnosis found.   History of Present Illness: Jody [name] is a 53 y.o.  female  with a history of ***.  She presents for preoperative evaluation for upcoming procedure, ***, scheduled for *** with Dr. {[id number]::"EXBM","[id number]"}.  The patient {HAS HAS UVO:53664} had problems with anesthesia. ***  Summary of Previous Visit: ***  Job: ***  PMH Significant for: ***   Past Medical History: Allergies: No Known Allergies  Current Medications:  Current Outpatient Medications:    albuterol (VENTOLIN HFA) 108 (90 Base) MCG/ACT inhaler, INHALE 2 PUFFS 4 TIMES A DAY AS NEEDED, Disp: 18 each, Rfl: 1   famotidine (PEPCID) 20 MG tablet, Take 1 tablet (20 mg total) by mouth 2 (two) times daily., Disp: 60 tablet, Rfl: 11   fluticasone (FLONASE) 50 MCG/ACT nasal spray, SPRAY 2 SPRAYS INTO EACH NOSTRIL EVERY DAY, Disp: 48 mL, Rfl: 1   hydrocortisone (ANUSOL-HC) 25 MG suppository, Place 1 suppository (25 mg total) rectally 2 (two) times daily as needed for hemorrhoids or anal itching., Disp: 24 suppository, Rfl: 0   ibuprofen (ADVIL) 800 MG tablet, Take 800 mg by mouth every 6 (six) hours as needed., Disp: , Rfl:    LORazepam (ATIVAN) 1 MG tablet, Take 0.5 tablets (0.5 mg total) by mouth daily as needed for anxiety., Disp: 30 tablet, Rfl: 0   metoprolol tartrate (LOPRESSOR) 50 MG tablet, Take 1 tablet (50 mg total) by mouth 2 (two) times daily., Disp: 180 tablet, Rfl: 1   Multiple Vitamin (MULTI VITAMIN PO), Take by mouth., Disp: , Rfl:    traMADol (ULTRAM) 50 MG tablet, Take 1 tablet (50 mg total) by mouth every 6 (six) hours as needed., Disp: 20 tablet, Rfl: 0  Past Medical Problems: Past Medical History:  Diagnosis Date   Anemia    Anxiety    Asthma    Breast cancer (HCC)    Depression    GERD (gastroesophageal reflux disease)    History of  anal fissures    History of gallstones    Hypertension    Migraine headache with aura    Migraines    without aura   Obesity     Past Surgical History: Past Surgical History:  Procedure Laterality Date   BREAST BIOPSY Left 10/08/2023   MM LT BREAST BX W LOC DEV 1ST LESION IMAGE BX SPEC STEREO GUIDE 10/08/2023 [facility]   BREAST BIOPSY Left 10/08/2023   MM LT BREAST BX W LOC DEV EA AD LESION IMG BX SPEC STEREO GUIDE 10/08/2023 [facility]   BREAST BIOPSY  11/16/2023   MM LT RADIOACTIVE SEED LOC MAMMO GUIDE 11/16/2023 [facility]   BREAST BIOPSY  11/16/2023   MM LT RADIOACTIVE SEED EA ADD LESION LOC MAMMO GUIDE 11/16/2023 [facility]   BREAST LUMPECTOMY WITH RADIOACTIVE SEED AND SENTINEL LYMPH NODE BIOPSY Left 11/17/2023   Procedure: LEFT BREAST SEED GUIDED BRACKETED LUMPECTOMY AND SENTINEL LYMPH NODE BIOPSY;  Surgeon: [name], MD;  Location: [facility];  Service: General;  Laterality: Left;   CHOLECYSTECTOMY     ESOPHAGOGASTRODUODENOSCOPY     FLEXIBLE SIGMOIDOSCOPY  03/09/2012   Procedure: FLEXIBLE SIGMOIDOSCOPY;  Surgeon: Louis Meckel, MD;  Location: [facility];  Service: Endoscopy;  Laterality: N/A;   gastric sleeve surgery  03/14/2021   TUBAL LIGATION     [facility]  Social History: Social History   Socioeconomic History   Marital status: Single    Spouse name: Not on file   Number of children: 3   Years of education: Not on file   Highest education level: Not on file  Occupational History   Occupation: disability    Employer: [employer]  Tobacco Use   Smoking status: Never   Smokeless tobacco: Never  Vaping Use   Vaping status: Never Used  Substance and Sexual Activity   Alcohol use: No   Drug use: No   Sexual activity: Not Currently    Partners: Male    Comment: [facility]  Other Topics Concern   Not on file  Social History Narrative   Exercise-- walking 30 min daily   Social Drivers of Health    Financial Resource Strain: High Risk (10/26/2023)   Overall Financial Resource Strain (CARDIA)    Difficulty of Paying Living Expenses: Very hard  Food Insecurity: No Food Insecurity (10/22/2023)   Hunger Vital Sign    Worried About Running Out of Food in the Last Year: Never true    Ran Out of Food in the Last Year: Never true  Transportation Needs: Unmet Transportation Needs (10/22/2023)   PRAPARE - Administrator, Civil Service (Medical): Yes    Lack of Transportation (Non-Medical): Yes  Physical Activity: Not on file  Stress: Not on file  Social Connections: Not on file  Intimate Partner Violence: Not At Risk (10/22/2023)   Humiliation, Afraid, Rape, and Kick questionnaire    Fear of Current or Ex-Partner: No    Emotionally Abused: No    Physically Abused: No    Sexually Abused: No    Family History: Family History  Problem Relation Age of Onset   Breast cancer Mother 31       no genetic testing   Hypertension Mother    Hepatitis Father 55       Hep C   Diabetes Father    Arthritis Father    Hypertension Father    Kidney disease Father    Heart attack Father 69   Breast cancer Maternal Aunt        dx late 15s; mother's mat half sister   Cancer Maternal Uncle        x2 mat uncles; unk type; dx >50   Coronary artery disease Maternal Grandmother    Cataracts Maternal Grandmother    Glaucoma Maternal Grandmother    Heart disease Maternal Grandmother    Colon cancer Paternal Grandmother        dx >50   Obesity Paternal Grandmother    Cancer Cousin        unk type; dx 75s; mat female cousin   Obesity Other    Stomach cancer Neg Hx    Rectal cancer Neg Hx     Review of Systems: ROS  Physical Exam: Vital Signs LMP 10/30/2023   Physical Exam *** Constitutional:      General: Not in acute distress.    Appearance: Normal appearance. Not ill-appearing.  HENT:     Head: Normocephalic and atraumatic.  Eyes:     Pupils: Pupils are equal,  round. Cardiovascular:     Rate and Rhythm: Normal rate.    Pulses: Normal pulses.  Pulmonary:     Effort: No respiratory distress or increased work of breathing.  Speaks in full sentences. Abdominal:     General: Abdomen is flat. No distension.   Musculoskeletal: Normal range of  motion. No lower extremity swelling or edema. No varicosities. *** Skin:    General: Skin is warm and dry.     Findings: No erythema or rash.  Neurological:     Mental Status: Alert and oriented to person, place, and time.  Psychiatric:        Mood and Affect: Mood normal.        Behavior: Behavior normal.    Assessment/Plan: The patient is scheduled for *** with Dr. Jenean Lindau.  Risks, benefits, and alternatives of procedure discussed, questions answered and consent obtained.    Smoking Status: ***; Counseling Given? *** Last Mammogram: ***; Results: ***  Caprini Score: ***; Risk Factors include: ***, BMI *** 25, and length of planned surgery. Recommendation for mechanical *** pharmacological prophylaxis. Encourage early ambulation.   Pictures obtained: ***  Post-op Rx sent to pharmacy: ***  Patient was provided with the *** General Surgical Risk consent document and Pain Medication Agreement prior to their appointment.  They had adequate time to read through the risk consent documents and Pain Medication Agreement. We also discussed them in person together during this preop appointment. All of their questions were answered to their satisfaction.  Recommended calling if they have any further questions.  Risk consent form and Pain Medication Agreement to be scanned into patient's chart.  ***   Electronically signed by: Evelena Leyden, PA-C 12/02/2023 3:46 PM

## 2023-12-03 ENCOUNTER — Encounter: Payer: Self-pay | Admitting: *Deleted

## 2023-12-03 ENCOUNTER — Telehealth: Payer: Self-pay | Admitting: *Deleted

## 2023-12-03 ENCOUNTER — Ambulatory Visit (INDEPENDENT_AMBULATORY_CARE_PROVIDER_SITE_OTHER): Admitting: Physician Assistant

## 2023-12-03 VITALS — BP 137/87 | HR 64 | Ht 64.0 in | Wt 230.2 lb

## 2023-12-03 DIAGNOSIS — Z170 Estrogen receptor positive status [ER+]: Secondary | ICD-10-CM

## 2023-12-03 DIAGNOSIS — C50412 Malignant neoplasm of upper-outer quadrant of left female breast: Secondary | ICD-10-CM

## 2023-12-03 MED ORDER — OXYCODONE HCL 5 MG PO TABS: 5.0000 mg | ORAL_TABLET | Freq: Four times a day (QID) | ORAL | 0 refills | Status: AC | PRN

## 2023-12-03 MED ORDER — ONDANSETRON 4 MG PO TBDP: 4.0000 mg | ORAL_TABLET | Freq: Three times a day (TID) | ORAL | 0 refills | Status: DC | PRN

## 2023-12-03 MED ORDER — CEPHALEXIN 500 MG PO CAPS: 500.0000 mg | ORAL_CAPSULE | Freq: Four times a day (QID) | ORAL | 0 refills | Status: AC

## 2023-12-03 NOTE — Telephone Encounter (Signed)
Received oncotype results of 17/5%. Patient is aware. Referral placed for Dr. Moody. ?

## 2023-12-06 ENCOUNTER — Ambulatory Visit: Payer: Self-pay | Attending: Hematology | Admitting: Rehabilitation

## 2023-12-06 ENCOUNTER — Encounter (HOSPITAL_BASED_OUTPATIENT_CLINIC_OR_DEPARTMENT_OTHER): Payer: Self-pay | Admitting: Surgery

## 2023-12-06 ENCOUNTER — Encounter: Payer: Self-pay | Admitting: Rehabilitation

## 2023-12-06 DIAGNOSIS — C50412 Malignant neoplasm of upper-outer quadrant of left female breast: Secondary | ICD-10-CM | POA: Diagnosis present

## 2023-12-06 DIAGNOSIS — Z483 Aftercare following surgery for neoplasm: Secondary | ICD-10-CM | POA: Diagnosis present

## 2023-12-06 DIAGNOSIS — Z170 Estrogen receptor positive status [ER+]: Secondary | ICD-10-CM | POA: Diagnosis present

## 2023-12-06 DIAGNOSIS — Z9189 Other specified personal risk factors, not elsewhere classified: Secondary | ICD-10-CM | POA: Insufficient documentation

## 2023-12-06 DIAGNOSIS — R293 Abnormal posture: Secondary | ICD-10-CM | POA: Diagnosis present

## 2023-12-06 NOTE — Therapy (Signed)
 OUTPATIENT PHYSICAL THERAPY BREAST CANCER POST OP FOLLOW UP   Patient Name: Jody [name] MRN: [id number] DOB:Nov 23, 1970, 53 y.o., female Today's Date: 12/06/2023  END OF SESSION:  PT End of Session - 12/06/23 2047     Visit Number 2    Number of Visits 2    Date for PT Re-Evaluation 12/28/23    PT Start Time 1300    PT Stop Time 1331    PT Time Calculation (min) 31 min    Activity Tolerance Patient tolerated treatment well    Behavior During Therapy WFL for tasks assessed/performed             Past Medical History:  Diagnosis Date   Anemia    Anxiety    Asthma    Breast cancer (HCC)    Depression    GERD (gastroesophageal reflux disease)    History of anal fissures    History of gallstones    Hypertension    Migraine headache with aura    Migraines    without aura   Obesity    Past Surgical History:  Procedure Laterality Date   BREAST BIOPSY Left 10/08/2023   MM LT BREAST BX W LOC DEV 1ST LESION IMAGE BX SPEC STEREO GUIDE 10/08/2023 [facility]   BREAST BIOPSY Left 10/08/2023   MM LT BREAST BX W LOC DEV EA AD LESION IMG BX SPEC STEREO GUIDE 10/08/2023 [facility]   BREAST BIOPSY  11/16/2023   MM LT RADIOACTIVE SEED LOC MAMMO GUIDE 11/16/2023 [facility]   BREAST BIOPSY  11/16/2023   MM LT RADIOACTIVE SEED EA ADD LESION LOC MAMMO GUIDE 11/16/2023 [facility]   BREAST LUMPECTOMY WITH RADIOACTIVE SEED AND SENTINEL LYMPH NODE BIOPSY Left 11/17/2023   Procedure: LEFT BREAST SEED GUIDED BRACKETED LUMPECTOMY AND SENTINEL LYMPH NODE BIOPSY;  Surgeon: [name], MD;  Location: [facility];  Service: General;  Laterality: Left;   CHOLECYSTECTOMY     ESOPHAGOGASTRODUODENOSCOPY     FLEXIBLE SIGMOIDOSCOPY  03/09/2012   Procedure: FLEXIBLE SIGMOIDOSCOPY;  Surgeon: Louis Meckel, MD;  Location: [facility];  Service: Endoscopy;  Laterality: N/A;   gastric sleeve surgery  03/14/2021   TUBAL LIGATION     Essure   Patient  Active Problem List   Diagnosis Date Noted   Well woman exam with routine gynecological exam 11/12/2023   Perimenopause 11/12/2023   Anxiety 11/12/2023   Genetic testing 11/02/2023   Malignant neoplasm of upper-outer quadrant of left breast in female, estrogen receptor positive (HCC) 10/18/2023   Bronchitis 12/21/2022   Colon cancer screening 12/21/2022   Allergies 12/21/2022   Need for hepatitis C screening test 12/21/2022   Abnormal vaginal bleeding 12/30/2020   Endometriosis 08/21/2019   Low back pain with radiation 04/28/2019   Stress reaction 05/16/2017   Essential hypertension 06/18/2015   Hyperglycemia 06/18/2015   Hemorrhoids 01/31/2012   Fatigue 12/31/2011   Dyspepsia 12/31/2011   B12 deficiency 01/13/2011   Anxiety and depression 12/26/2010   Migraines 12/15/2010   Stress 12/15/2010   OBESITY, MORBID 04/04/2007    PCP: Seabron Spates, DO  REFERRING PROVIDER: Malachy Mood, MD  REFERRING DIAG: Left breast cancer   THERAPY DIAG:  Malignant neoplasm of upper-outer quadrant of left breast in female, estrogen receptor positive (HCC)  Abnormal posture  Aftercare following surgery for neoplasm  At risk for lymphedema  Rationale for Evaluation and Treatment: Rehabilitation  ONSET DATE: 10/20/23  SUBJECTIVE:  SUBJECTIVE STATEMENT: My worst thing is the burning under the arm.    PERTINENT HISTORY:  Patient was diagnosed on 10/20/23 with left grade IDC with DCIS measures 5.1 cm and is located in the upper outer quadrant. It is ER/PR positive, HER2 neg with a Ki67 of 5%. Lt breast lumpectomy with SLNB on 11/17/23 with 5 negative nodes removed.  Will have a re-excision and  a reduction with Dr. Ulice Bold  12/12/23.  Then will have radiation.    PATIENT GOALS:  Reassess how my recovery is  going related to arm function, pain, and swelling.  PAIN:  Are you having pain? Yes: NPRS scale: 5 Pain location: left armpit and upper arm Pain description: burning  Aggravating factors: Nothing really Relieving factors: medication   PRECAUTIONS: Recent Surgery, left UE Lymphedema risk  RED FLAGS: None   ACTIVITY LEVEL / LEISURE: I'm not walking my dog yet.  I am not doing chores really.     OBJECTIVE:  PATIENT SURVEYS:  QUICK DASH: 55% from 59% baseline   OBSERVATIONS: Still wearing compression, healed incision with glue   POSTURE:  Rounded shoulders   LYMPHEDEMA ASSESSMENT:   UPPER EXTREMITY AROM/PROM:   A/PROM RIGHT   eval    Shoulder extension 45  Shoulder flexion 140  Shoulder abduction 155  Shoulder internal rotation 50  Shoulder external rotation 80                          (Blank rows = not tested)   A/PROM LEFT   Eval - the left arm had pain with all the movements in the shoulder 4/10 12/06/23 left arm had pain with all the movements in the shoulder 4/10  Shoulder extension 45 45  Shoulder flexion 145 150  Shoulder abduction 150 150  Shoulder internal rotation 60 45- "feels heavy"  Shoulder external rotation 70 50 - "feels heavy"                          (Blank rows = not tested)   CERVICAL AROM: All within normal limits:    LYMPHEDEMA ASSESSMENTS (in cm):    LANDMARK RIGHT   eval  10 cm proximal to olecranon process 39.8  Olecranon process 28.2  10 cm proximal to ulnar styloid process 22.5  Just proximal to ulnar styloid process 17.2  Across hand at thumb web space 19.8  At base of 2nd digit 6.5  (Blank rows = not tested)   LANDMARK LEFT   eval 12/06/23  10 cm proximal to olecranon process 40 40  Olecranon process 29 29  10  cm proximal to ulnar styloid process 23 23.4  Just proximal to ulnar styloid process 17 17  Across hand at thumb web space 20.2 20.2  At base of 2nd digit 6.5 6.5  (Blank rows = not tested)  PATIENT EDUCATION:   Education details: per post op note below Person educated: Patient Education method: Programmer, multimedia, Facilities manager, Verbal cues, and Handouts Education comprehension: verbalized understanding  HOME EXERCISE PROGRAM: Reviewed previously given post op HEP.   ASSESSMENT:  CLINICAL IMPRESSION: Pt is doing well post lumpectomy and SLNB with most trouble coming from nn related burning in the axilla and upper arm.  Pt will be having a re-excision and breast reduction next week so we discussed doing exercises up until surgery and then returning to the same exercises after the drains are removed.  Pt would like to schedule a  post op reduction visit to make sure things continue to go well.   Pt will benefit from skilled therapeutic intervention to improve on t/he following deficits: Decreased knowledge of precautions, impaired UE functional use, pain, decreased ROM, postural dysfunction.  PT treatment/interventions: ADL/Self care home management, 97110-Therapeutic exercises, 97535- Self Care, and 40981- Manual therapy   GOALS: Goals reviewed with patient? Yes  LONG TERM GOALS:  (STG=LTG)  GOALS Name Target Date  Goal status  1 Pt will demonstrate she has regained full shoulder ROM and function post operatively compared to baselines.  Baseline: 01/07/24 ONGOING                    PLAN:  PT FREQUENCY/DURATION: Another visit after reduction to check in and have visits as needed.   PLAN FOR NEXT SESSION: post op check in   [facility]  [address]  [place] [address]  [phone]  After Breast Cancer Class Video It is recommended you view the ABC class video to be educated on lymphedema risk reduction. This video lasts for about 30 minutes. It can be viewed on our website here: [email or web address]  Scar massage You can begin gentle scar massage to you incision sites. Gently place  one hand on the incision and move the skin (without sliding on the skin) in various directions. Do this for a few minutes and then you can gently massage either coconut oil or vitamin E cream into the scars.  Compression garment You should continue wearing your compression bra until you feel like you no longer have swelling.  Home exercise Program Continue doing the exercises you were given until you feel like you can do them without feeling any tightness at the end.   Walking Program Studies show that 30 minutes of walking per day (fast enough to elevate your heart rate) can significantly reduce the risk of a cancer recurrence. If you can't walk due to other medical reasons, we encourage you to find another activity you could do (like a stationary bike or water exercise).  Posture After breast cancer surgery, people frequently sit with rounded shoulders posture because it puts their incisions on slack and feels better. If you sit like this and scar tissue forms in that position, you can become very tight and have pain sitting or standing with good posture. Try to be aware of your posture and sit and stand up tall to heal properly.  Follow up PT: It is recommended you return every 3 months for the first 3 years following surgery to be assessed on the SOZO machine for an L-Dex score. This helps prevent clinically significant lymphedema in 95% of patients. These follow up screens are 10 minute appointments that you are not billed for.  Idamae Lusher, PT 12/06/2023, 8:48 PM

## 2023-12-10 MED ORDER — ENSURE PRE-SURGERY PO LIQD: 296.0000 mL | Freq: Once | ORAL | Status: DC

## 2023-12-10 MED ORDER — CHLORHEXIDINE GLUCONATE CLOTH 2 % EX PADS: 6.0000 | MEDICATED_PAD | Freq: Once | CUTANEOUS | Status: DC

## 2023-12-10 NOTE — Progress Notes (Signed)

## 2023-12-12 NOTE — H&P (Signed)
 Jody [name] is an 53 y.o. female.   Chief Complaint: left breast positive margin HPI: This is a pleasant 53 yr old female who is s/p a radioactive seed guided left breast bracketed lumpectomy for invasive ductal CA and DCIS.  There was a positive medial margin involving DCIS.  The margins for the invasive cancer where widely negative.  5 sentinel nodes removed where also negative.  She now presents for re-excision of the medial margin.  She will also be undergoing oncoplastic reduction by Dr. Ulice Bold.  She is doing well post op  Past Medical History:  Diagnosis Date   Anemia    Anxiety    Asthma    Breast cancer (HCC)    Depression    GERD (gastroesophageal reflux disease)    History of anal fissures    History of gallstones    Hypertension    Migraine headache with aura    Migraines    without aura   Obesity     Past Surgical History:  Procedure Laterality Date   BREAST BIOPSY Left 10/08/2023   MM LT BREAST BX W LOC DEV 1ST LESION IMAGE BX SPEC STEREO GUIDE 10/08/2023 [facility]   BREAST BIOPSY Left 10/08/2023   MM LT BREAST BX W LOC DEV EA AD LESION IMG BX SPEC STEREO GUIDE 10/08/2023 [facility]   BREAST BIOPSY  11/16/2023   MM LT RADIOACTIVE SEED LOC MAMMO GUIDE 11/16/2023 [facility]   BREAST BIOPSY  11/16/2023   MM LT RADIOACTIVE SEED EA ADD LESION LOC MAMMO GUIDE 11/16/2023 [facility]   BREAST LUMPECTOMY WITH RADIOACTIVE SEED AND SENTINEL LYMPH NODE BIOPSY Left 11/17/2023   Procedure: LEFT BREAST SEED GUIDED BRACKETED LUMPECTOMY AND SENTINEL LYMPH NODE BIOPSY;  Surgeon: [name], MD;  Location: [facility];  Service: General;  Laterality: Left;   CHOLECYSTECTOMY     ESOPHAGOGASTRODUODENOSCOPY     FLEXIBLE SIGMOIDOSCOPY  03/09/2012   Procedure: FLEXIBLE SIGMOIDOSCOPY;  Surgeon: Louis Meckel, MD;  Location: [facility];  Service: Endoscopy;  Laterality: N/A;   gastric sleeve surgery  03/14/2021   TUBAL LIGATION      Essure    Family History  Problem Relation Age of Onset   Breast cancer Mother 43       no genetic testing   Hypertension Mother    Hepatitis Father 27       Hep C   Diabetes Father    Arthritis Father    Hypertension Father    Kidney disease Father    Heart attack Father 46   Breast cancer Maternal Aunt        dx late 59s; mother's mat half sister   Cancer Maternal Uncle        x2 mat uncles; unk type; dx >50   Coronary artery disease Maternal Grandmother    Cataracts Maternal Grandmother    Glaucoma Maternal Grandmother    Heart disease Maternal Grandmother    Colon cancer Paternal Grandmother        dx >50   Obesity Paternal Grandmother    Cancer Cousin        unk type; dx 52s; mat female cousin   Obesity Other    Stomach cancer Neg Hx    Rectal cancer Neg Hx    Social History:  reports that she has never smoked. She has never used smokeless tobacco. She reports that she does not drink alcohol and does not use drugs.  Allergies: No Known Allergies  No  medications prior to admission.    No results found for this or any previous visit (from the past 48 hours). No results found.  Review of Systems  All other systems reviewed and are negative.   Height 5' 4.5" (1.638 m), weight 104.3 kg, last menstrual period 11/21/2023. Physical Exam Constitutional:      Appearance: Normal appearance.  HENT:     Head: Normocephalic and atraumatic.  Cardiovascular:     Rate and Rhythm: Normal rate and regular rhythm.  Pulmonary:     Effort: Pulmonary effort is normal. No respiratory distress.  Abdominal:     Palpations: Abdomen is soft.     Tenderness: There is no abdominal tenderness.  Skin:    General: Skin is warm and dry.  Neurological:     General: No focal deficit present.     Mental Status: She is alert.  Psychiatric:        Mood and Affect: Mood normal.        Behavior: Behavior normal.    Breast incision healing well  Assessment/Plan Left breast cancer  s/p lumpectomy with positive margin for DCIS  Plan will be to proceed to the OR for re-excision of the medial left breast margin follow by oncoplastic reduction by Plastic Surgery.  The risks and benefits where again discussed in detail  [name], MD 12/12/2023, 10:07 AM

## 2023-12-13 ENCOUNTER — Ambulatory Visit (HOSPITAL_BASED_OUTPATIENT_CLINIC_OR_DEPARTMENT_OTHER): Admitting: Anesthesiology

## 2023-12-13 ENCOUNTER — Encounter (HOSPITAL_BASED_OUTPATIENT_CLINIC_OR_DEPARTMENT_OTHER): Payer: Self-pay | Admitting: Surgery

## 2023-12-13 ENCOUNTER — Encounter (HOSPITAL_BASED_OUTPATIENT_CLINIC_OR_DEPARTMENT_OTHER)
Admission: RE | Disposition: A | Discharge status: Home / Self Care | Payer: Self-pay | Source: Home / Self Care | Attending: Surgery

## 2023-12-13 ENCOUNTER — Ambulatory Visit (HOSPITAL_BASED_OUTPATIENT_CLINIC_OR_DEPARTMENT_OTHER)
Admission: RE | Admit: 2023-12-13 | Discharge: 2023-12-13 | Disposition: A | Discharge status: Home / Self Care | Attending: Surgery | Admitting: Surgery

## 2023-12-13 DIAGNOSIS — F418 Other specified anxiety disorders: Secondary | ICD-10-CM | POA: Insufficient documentation

## 2023-12-13 DIAGNOSIS — Z6838 Body mass index (BMI) 38.0-38.9, adult: Secondary | ICD-10-CM | POA: Diagnosis not present

## 2023-12-13 DIAGNOSIS — Z170 Estrogen receptor positive status [ER+]: Secondary | ICD-10-CM | POA: Insufficient documentation

## 2023-12-13 DIAGNOSIS — Z5986 Financial insecurity: Secondary | ICD-10-CM | POA: Diagnosis not present

## 2023-12-13 DIAGNOSIS — C50412 Malignant neoplasm of upper-outer quadrant of left female breast: Secondary | ICD-10-CM | POA: Diagnosis not present

## 2023-12-13 DIAGNOSIS — N62 Hypertrophy of breast: Secondary | ICD-10-CM | POA: Insufficient documentation

## 2023-12-13 DIAGNOSIS — F419 Anxiety disorder, unspecified: Secondary | ICD-10-CM | POA: Diagnosis not present

## 2023-12-13 DIAGNOSIS — Z8249 Family history of ischemic heart disease and other diseases of the circulatory system: Secondary | ICD-10-CM | POA: Diagnosis not present

## 2023-12-13 DIAGNOSIS — J45909 Unspecified asthma, uncomplicated: Secondary | ICD-10-CM | POA: Diagnosis not present

## 2023-12-13 DIAGNOSIS — Z79899 Other long term (current) drug therapy: Secondary | ICD-10-CM | POA: Insufficient documentation

## 2023-12-13 DIAGNOSIS — I10 Essential (primary) hypertension: Secondary | ICD-10-CM | POA: Insufficient documentation

## 2023-12-13 DIAGNOSIS — C50912 Malignant neoplasm of unspecified site of left female breast: Secondary | ICD-10-CM | POA: Diagnosis present

## 2023-12-13 DIAGNOSIS — Z803 Family history of malignant neoplasm of breast: Secondary | ICD-10-CM | POA: Diagnosis not present

## 2023-12-13 DIAGNOSIS — Z01818 Encounter for other preprocedural examination: Secondary | ICD-10-CM

## 2023-12-13 DIAGNOSIS — K219 Gastro-esophageal reflux disease without esophagitis: Secondary | ICD-10-CM | POA: Insufficient documentation

## 2023-12-13 DIAGNOSIS — M96843 Postprocedural seroma of a musculoskeletal structure following other procedure: Secondary | ICD-10-CM | POA: Insufficient documentation

## 2023-12-13 DIAGNOSIS — E669 Obesity, unspecified: Secondary | ICD-10-CM | POA: Diagnosis not present

## 2023-12-13 HISTORY — PX: BREAST LUMPECTOMY: SHX2

## 2023-12-13 HISTORY — PX: BREAST REDUCTION SURGERY: SHX8

## 2023-12-13 LAB — POCT PREGNANCY, URINE: Preg Test, Ur: NEGATIVE

## 2023-12-13 SURGERY — BREAST LUMPECTOMY
Anesthesia: General | Site: Breast | Laterality: Left

## 2023-12-13 MED ORDER — FENTANYL CITRATE (PF) 100 MCG/2ML IJ SOLN
INTRAMUSCULAR | Status: AC
  Filled 2023-12-13: qty 2

## 2023-12-13 MED ORDER — SUGAMMADEX SODIUM 200 MG/2ML IV SOLN
INTRAVENOUS | Status: DC | PRN
Start: 2023-12-13 — End: 2023-12-13
  Administered 2023-12-13: 200 mg via INTRAVENOUS

## 2023-12-13 MED ORDER — SCOPOLAMINE 1 MG/3DAYS TD PT72
MEDICATED_PATCH | TRANSDERMAL | Status: AC
  Filled 2023-12-13: qty 1

## 2023-12-13 MED ORDER — ACETAMINOPHEN 325 MG PO TABS: 650.0000 mg | ORAL_TABLET | ORAL | Status: DC | PRN

## 2023-12-13 MED ORDER — EPHEDRINE 5 MG/ML INJ
INTRAVENOUS | Status: AC
  Filled 2023-12-13: qty 5

## 2023-12-13 MED ORDER — SODIUM CHLORIDE 0.9% FLUSH: 3.0000 mL | INTRAVENOUS | Status: DC | PRN

## 2023-12-13 MED ORDER — CHLORHEXIDINE GLUCONATE CLOTH 2 % EX PADS: 6.0000 | MEDICATED_PAD | Freq: Once | CUTANEOUS | Status: DC

## 2023-12-13 MED ORDER — ACETAMINOPHEN 500 MG PO TABS
ORAL_TABLET | ORAL | Status: AC
  Filled 2023-12-13: qty 2

## 2023-12-13 MED ORDER — ACETAMINOPHEN 500 MG PO TABS
1000.0000 mg | ORAL_TABLET | ORAL | Status: AC
  Administered 2023-12-13: 1000 mg via ORAL

## 2023-12-13 MED ORDER — ONDANSETRON HCL 4 MG/2ML IJ SOLN
INTRAMUSCULAR | Status: AC
  Filled 2023-12-13: qty 2

## 2023-12-13 MED ORDER — MIDAZOLAM HCL 5 MG/5ML IJ SOLN
INTRAMUSCULAR | Status: DC | PRN
  Administered 2023-12-13: 2 mg via INTRAVENOUS

## 2023-12-13 MED ORDER — CEFAZOLIN SODIUM-DEXTROSE 2-4 GM/100ML-% IV SOLN
2.0000 g | INTRAVENOUS | Status: AC
  Administered 2023-12-13: 2 g via INTRAVENOUS

## 2023-12-13 MED ORDER — PROPOFOL 10 MG/ML IV BOLUS
INTRAVENOUS | Status: DC | PRN
  Administered 2023-12-13: 170 mg via INTRAVENOUS

## 2023-12-13 MED ORDER — CEFAZOLIN SODIUM-DEXTROSE 2-4 GM/100ML-% IV SOLN: 2.0000 g | INTRAVENOUS | Status: AC

## 2023-12-13 MED ORDER — LACTATED RINGERS IV SOLN: INTRAVENOUS | Status: DC

## 2023-12-13 MED ORDER — FENTANYL CITRATE (PF) 100 MCG/2ML IJ SOLN: 25.0000 ug | INTRAMUSCULAR | Status: DC | PRN

## 2023-12-13 MED ORDER — OXYCODONE HCL 5 MG PO TABS
5.0000 mg | ORAL_TABLET | ORAL | Status: DC | PRN
  Administered 2023-12-13: 5 mg via ORAL

## 2023-12-13 MED ORDER — LIDOCAINE HCL 1 % IJ SOLN
INTRAVENOUS | Status: DC | PRN
  Administered 2023-12-13: 1000 mL

## 2023-12-13 MED ORDER — LIDOCAINE 2% (20 MG/ML) 5 ML SYRINGE
INTRAMUSCULAR | Status: AC
  Filled 2023-12-13: qty 5

## 2023-12-13 MED ORDER — BUPIVACAINE HCL 0.25 % IJ SOLN
INTRAMUSCULAR | Status: DC | PRN
  Administered 2023-12-13: 50 mL

## 2023-12-13 MED ORDER — MIDAZOLAM HCL 2 MG/2ML IJ SOLN
INTRAMUSCULAR | Status: AC
  Filled 2023-12-13: qty 2

## 2023-12-13 MED ORDER — SODIUM CHLORIDE 0.9 % IV SOLN: 250.0000 mL | INTRAVENOUS | Status: DC | PRN

## 2023-12-13 MED ORDER — DEXAMETHASONE SODIUM PHOSPHATE 10 MG/ML IJ SOLN
INTRAMUSCULAR | Status: DC | PRN
  Administered 2023-12-13: 10 mg via INTRAVENOUS

## 2023-12-13 MED ORDER — SODIUM CHLORIDE 0.9 % IV SOLN
INTRAVENOUS | Status: DC | PRN
  Administered 2023-12-13: 40 mL

## 2023-12-13 MED ORDER — AMISULPRIDE (ANTIEMETIC) 5 MG/2ML IV SOLN
10.0000 mg | Freq: Once | INTRAVENOUS | Status: AC | PRN
  Administered 2023-12-13: 10 mg via INTRAVENOUS

## 2023-12-13 MED ORDER — FENTANYL CITRATE (PF) 250 MCG/5ML IJ SOLN
INTRAMUSCULAR | Status: DC | PRN
  Administered 2023-12-13: 100 ug via INTRAVENOUS

## 2023-12-13 MED ORDER — OXYCODONE HCL 5 MG PO TABS
ORAL_TABLET | ORAL | Status: AC
  Filled 2023-12-13: qty 1

## 2023-12-13 MED ORDER — ACETAMINOPHEN 325 MG RE SUPP: 650.0000 mg | RECTAL | Status: DC | PRN

## 2023-12-13 MED ORDER — BUPIVACAINE LIPOSOME 1.3 % IJ SUSP
INTRAMUSCULAR | Status: AC
Start: 2023-12-13 — End: ?
  Filled 2023-12-13: qty 20

## 2023-12-13 MED ORDER — DEXAMETHASONE SODIUM PHOSPHATE 10 MG/ML IJ SOLN
INTRAMUSCULAR | Status: AC
  Filled 2023-12-13: qty 1

## 2023-12-13 MED ORDER — ROCURONIUM BROMIDE 10 MG/ML (PF) SYRINGE
PREFILLED_SYRINGE | INTRAVENOUS | Status: DC | PRN
  Administered 2023-12-13: 70 mg via INTRAVENOUS
  Administered 2023-12-13: 10 mg via INTRAVENOUS

## 2023-12-13 MED ORDER — ROCURONIUM BROMIDE 10 MG/ML (PF) SYRINGE
PREFILLED_SYRINGE | INTRAVENOUS | Status: AC
  Filled 2023-12-13: qty 10

## 2023-12-13 MED ORDER — FENTANYL CITRATE (PF) 100 MCG/2ML IJ SOLN
25.0000 ug | INTRAMUSCULAR | Status: DC | PRN
  Administered 2023-12-13 (×2): 25 ug via INTRAVENOUS

## 2023-12-13 MED ORDER — SODIUM CHLORIDE 0.9% FLUSH: 3.0000 mL | Freq: Two times a day (BID) | INTRAVENOUS | Status: DC

## 2023-12-13 MED ORDER — CEFAZOLIN SODIUM-DEXTROSE 2-4 GM/100ML-% IV SOLN
INTRAVENOUS | Status: AC
  Filled 2023-12-13: qty 100

## 2023-12-13 MED ORDER — AMISULPRIDE (ANTIEMETIC) 5 MG/2ML IV SOLN
INTRAVENOUS | Status: AC
  Filled 2023-12-13: qty 4

## 2023-12-13 MED ORDER — LIDOCAINE 2% (20 MG/ML) 5 ML SYRINGE
INTRAMUSCULAR | Status: DC | PRN
  Administered 2023-12-13: 100 mg via INTRAVENOUS

## 2023-12-13 MED ORDER — VASHE WOUND IRRIGATION OPTIME
TOPICAL | Status: DC | PRN
  Administered 2023-12-13: 34 [oz_av]

## 2023-12-13 MED ORDER — ONDANSETRON HCL 4 MG/2ML IJ SOLN
INTRAMUSCULAR | Status: DC | PRN
Start: 2023-12-13 — End: 2023-12-13
  Administered 2023-12-13: 4 mg via INTRAVENOUS

## 2023-12-13 MED ORDER — PHENYLEPHRINE HCL-NACL 20-0.9 MG/250ML-% IV SOLN
INTRAVENOUS | Status: DC | PRN
  Administered 2023-12-13 (×2): 80 ug via INTRAVENOUS
  Administered 2023-12-13: 120 ug via INTRAVENOUS
  Administered 2023-12-13 (×2): 80 ug via INTRAVENOUS
  Administered 2023-12-13 (×2): 120 ug via INTRAVENOUS

## 2023-12-13 MED ORDER — ONDANSETRON HCL 4 MG/2ML IJ SOLN: 4.0000 mg | Freq: Once | INTRAMUSCULAR | Status: DC | PRN

## 2023-12-13 MED ORDER — EPHEDRINE SULFATE (PRESSORS) 50 MG/ML IJ SOLN
INTRAMUSCULAR | Status: DC | PRN
Start: 2023-12-13 — End: 2023-12-13
  Administered 2023-12-13: 5 mg via INTRAVENOUS
  Administered 2023-12-13: 10 mg via INTRAVENOUS

## 2023-12-13 SURGICAL SUPPLY — 77 items
BAG DECANTER FOR FLEXI CONT (MISCELLANEOUS) IMPLANT
BINDER BREAST LRG (GAUZE/BANDAGES/DRESSINGS) IMPLANT
BINDER BREAST MEDIUM (GAUZE/BANDAGES/DRESSINGS) IMPLANT
BINDER BREAST XLRG (GAUZE/BANDAGES/DRESSINGS) IMPLANT
BINDER BREAST XXLRG (GAUZE/BANDAGES/DRESSINGS) IMPLANT
BIOPATCH RED 1 DISK 7.0 (GAUZE/BANDAGES/DRESSINGS) IMPLANT
BLADE HEX COATED 2.75 (ELECTRODE) ×2 IMPLANT
BLADE KNIFE PERSONA 10 (BLADE) ×4 IMPLANT
BLADE SURG 15 STRL LF DISP TIS (BLADE) ×2 IMPLANT
CANISTER SUCT 1200ML W/VALVE (MISCELLANEOUS) ×2 IMPLANT
CHLORAPREP W/TINT 26 (MISCELLANEOUS) ×2 IMPLANT
CLEANSER WND VASHE 34 (WOUND CARE) IMPLANT
CLIP TI WIDE RED SMALL 6 (CLIP) IMPLANT
COLLAGEN CELLERATERX 5 GRAM (Miscellaneous) IMPLANT
COVER BACK TABLE 60X90IN (DRAPES) ×2 IMPLANT
COVER MAYO STAND STRL (DRAPES) ×2 IMPLANT
DERMABOND ADVANCED .7 DNX12 (GAUZE/BANDAGES/DRESSINGS) ×4 IMPLANT
DRAIN CHANNEL 19F RND (DRAIN) IMPLANT
DRAPE LAPAROSCOPIC ABDOMINAL (DRAPES) ×2 IMPLANT
DRAPE LAPAROTOMY 100X72 PEDS (DRAPES) ×2 IMPLANT
DRAPE UTILITY XL STRL (DRAPES) ×2 IMPLANT
DRESSING MEPILEX FLEX 4X4 (GAUZE/BANDAGES/DRESSINGS) IMPLANT
DRSG MEPILEX FLEX 4X4 (GAUZE/BANDAGES/DRESSINGS) ×2 IMPLANT
DRSG MEPILEX POST OP 4X8 (GAUZE/BANDAGES/DRESSINGS) ×4 IMPLANT
DRSG OPSITE POSTOP 4X12 (GAUZE/BANDAGES/DRESSINGS) IMPLANT
ELECT BLADE 4.0 EZ CLEAN MEGAD (MISCELLANEOUS) ×2 IMPLANT
ELECT REM PT RETURN 9FT ADLT (ELECTROSURGICAL) ×4 IMPLANT
ELECTRODE BLDE 4.0 EZ CLN MEGD (MISCELLANEOUS) ×2 IMPLANT
ELECTRODE REM PT RTRN 9FT ADLT (ELECTROSURGICAL) ×2 IMPLANT
EVACUATOR SILICONE 100CC (DRAIN) IMPLANT
GAUZE PAD ABD 8X10 STRL (GAUZE/BANDAGES/DRESSINGS) ×4 IMPLANT
GAUZE SPONGE 4X4 12PLY STRL LF (GAUZE/BANDAGES/DRESSINGS) ×2 IMPLANT
GLOVE BIO SURGEON STRL SZ 6.5 (GLOVE) ×6 IMPLANT
GLOVE BIOGEL PI IND STRL 7.0 (GLOVE) ×2 IMPLANT
GLOVE SURG SIGNA 7.5 PF LTX (GLOVE) ×2 IMPLANT
GOWN STRL REUS W/ TWL LRG LVL3 (GOWN DISPOSABLE) ×4 IMPLANT
GOWN STRL REUS W/ TWL XL LVL3 (GOWN DISPOSABLE) ×2 IMPLANT
KIT MARKER MARGIN INK (KITS) ×2 IMPLANT
NDL FILTER BLUNT 18X1 1/2 (NEEDLE) ×2 IMPLANT
NDL HYPO 25GX1X1/2 BEV (NEEDLE) ×4 IMPLANT
NDL HYPO 25X1 1.5 SAFETY (NEEDLE) ×2 IMPLANT
NDL SAFETY ECLIPSE 18X1.5 (NEEDLE) IMPLANT
NEEDLE FILTER BLUNT 18X1 1/2 (NEEDLE) ×2 IMPLANT
NEEDLE HYPO 25GX1X1/2 BEV (NEEDLE) ×4 IMPLANT
NEEDLE HYPO 25X1 1.5 SAFETY (NEEDLE) ×2 IMPLANT
NS IRRIG 1000ML POUR BTL (IV SOLUTION) ×2 IMPLANT
PACK BASIN DAY SURGERY FS (CUSTOM PROCEDURE TRAY) ×2 IMPLANT
PAD ALCOHOL SWAB (MISCELLANEOUS) IMPLANT
PAD FOAM SILICONE BACKED (GAUZE/BANDAGES/DRESSINGS) IMPLANT
PENCIL SMOKE EVACUATOR (MISCELLANEOUS) ×2 IMPLANT
PIN SAFETY STERILE (MISCELLANEOUS) IMPLANT
SLEEVE SCD COMPRESS KNEE MED (STOCKING) ×2 IMPLANT
SPIKE FLUID TRANSFER (MISCELLANEOUS) IMPLANT
SPONGE T-LAP 18X18 ~~LOC~~+RFID (SPONGE) ×4 IMPLANT
SPONGE T-LAP 4X18 ~~LOC~~+RFID (SPONGE) ×2 IMPLANT
STRIP SUTURE WOUND CLOSURE 1/2 (MISCELLANEOUS) ×4 IMPLANT
SUT MNCRL AB 4-0 PS2 18 (SUTURE) ×8 IMPLANT
SUT MON AB 3-0 SH27 (SUTURE) ×8 IMPLANT
SUT MON AB 5-0 PS2 18 (SUTURE) IMPLANT
SUT PDS 3-0 CT2 (SUTURE) ×8 IMPLANT
SUT PDS II 3-0 CT2 27 ABS (SUTURE) ×8 IMPLANT
SUT SILK 2 0 SH (SUTURE) ×2 IMPLANT
SUT SILK 3 0 PS 1 (SUTURE) IMPLANT
SUT VIC AB 3-0 SH 27X BRD (SUTURE) ×2 IMPLANT
SYR 3ML 23GX1 SAFETY (SYRINGE) IMPLANT
SYR 50ML LL SCALE MARK (SYRINGE) IMPLANT
SYR BULB IRRIG 60ML STRL (SYRINGE) ×2 IMPLANT
SYR CONTROL 10ML LL (SYRINGE) ×4 IMPLANT
TAPE MEASURE VINYL STERILE (MISCELLANEOUS) IMPLANT
TOWEL GREEN STERILE FF (TOWEL DISPOSABLE) ×4 IMPLANT
TRAY DSU PREP LF (CUSTOM PROCEDURE TRAY) ×2 IMPLANT
TRAY FAXITRON CT DISP (TRAY / TRAY PROCEDURE) IMPLANT
TUBE CONNECTING 20X1/4 (TUBING) ×2 IMPLANT
TUBING INFILTRATION IT-10001 (TUBING) IMPLANT
TUBING SET GRADUATE ASPIR 12FT (MISCELLANEOUS) IMPLANT
UNDERPAD 30X36 HEAVY ABSORB (UNDERPADS AND DIAPERS) ×4 IMPLANT
YANKAUER SUCT BULB TIP NO VENT (SUCTIONS) ×2 IMPLANT

## 2023-12-13 NOTE — Transfer of Care (Signed)
 Immediate Anesthesia Transfer of Care Note  Patient: Jody [name]  Procedure(s) Performed: BREAST LUMPECTOMY (Left: Breast) MAMMOPLASTY, REDUCTION (Bilateral: Breast)  Patient Location: PACU  Anesthesia Type:General  Level of Consciousness: drowsy  Airway & Oxygen Therapy: Patient Spontanous Breathing and Patient connected to face mask oxygen  Post-op Assessment: Report given to RN and Post -op Vital signs reviewed and stable  Post vital signs: Reviewed and stable  Last Vitals:  Vitals Value Taken Time  BP 123/75 12/13/23 1430  Temp    Pulse 78 12/13/23 1432  Resp 0 12/13/23 1432  SpO2 100 % 12/13/23 1432  Vitals shown include unfiled device data.  Last Pain:  Vitals:   12/13/23 1059  TempSrc: Temporal  PainSc: 0-No pain      Patients Stated Pain Goal: 3 (12/13/23 1059)  Complications: No notable events documented.

## 2023-12-13 NOTE — Anesthesia Procedure Notes (Signed)
 Procedure Name: Intubation Date/Time: 12/13/2023 12:27 PM  Performed by: Roosvelt Harps, CRNAPre-anesthesia Checklist: Patient identified, Emergency Drugs available, Suction available and Patient being monitored Patient Re-evaluated:Patient Re-evaluated prior to induction Oxygen Delivery Method: Circle System Utilized Preoxygenation: Pre-oxygenation with 100% oxygen Induction Type: IV induction Ventilation: Mask ventilation without difficulty Laryngoscope Size: Mac and 3 Grade View: Grade I Tube type: Oral Tube size: 7.0 mm Number of attempts: 1 Airway Equipment and Method: Stylet and Oral airway Placement Confirmation: ETT inserted through vocal cords under direct vision, positive ETCO2 and breath sounds checked- equal and bilateral Secured at: 21 cm Tube secured with: Tape Dental Injury: Teeth and Oropharynx as per pre-operative assessment

## 2023-12-13 NOTE — Interval H&P Note (Signed)
 History and Physical Interval Note:  12/13/2023 12:17 PM  Jody [name]  has presented today for surgery, with the diagnosis of POSITIVE LEFT BREAST CANCER MARGIN STATUS POST LUMPECTOMY.  The various methods of treatment have been discussed with the patient and family. After consideration of risks, benefits and other options for treatment, the patient has consented to  Procedure(s) with comments: BREAST LUMPECTOMY (Left) - RE-EXCISION LEFT BREAST CANCER MAMMOPLASTY, REDUCTION (Bilateral) - ONCOPLASTIC BILATERAL BREAST REDUCTION WITH LIPSUCTION as a surgical intervention.  The patient's history has been reviewed, patient examined, no change in status, stable for surgery.  I have reviewed the patient's chart and labs.  Questions were answered to the patient's satisfaction.     Alena Bills Cally Nygard

## 2023-12-13 NOTE — Op Note (Signed)
  Ladaisha Javeria Briski 12/13/2023   Pre-op Diagnosis: POSITIVE LEFT BREAST CANCER MARGIN STATUS POST LUMPECTOMY     Post-op Diagnosis: same  Procedure(s): RE-EXCISION LEFT BREAST MEDIAL MARGIN  Surgeon:  [name]   Anesthesia: General  Staff:  Circulator: McDonough-Hughes, Maceo Pro, RN Physician Assistant: Leslee Home, PA-C Scrub Person: Paulita Fujita A  Estimated Blood Loss: Minimal               Specimens: sent to path  Indications: This is a 53 year old female who had undergone a radioactive seed guided bracketed left breast lumpectomy and sentinel node biopsy for invasive ductal carcinoma and DCIS.  The margins were widely negative for the invasive cancer but the medial margin was positive for DCIS.  Her lymph nodes were negative as well.  She will now be undergoing oncoplastic bilateral breast reduction so we will be excising the medial margin of the lumpectomy cavity at same time.  Procedure: The patient was brought to the operating room and identified as correct patient.  She is placed upon the operating table and general anesthesia was induced.  Her bilateral breasts and axilla were then prepped and draped in usual sterile fashion.  I anesthetized the skin and the outer quadrant near the axilla of the left breast at the previous scar.  I then opened up the old incision with a scalpel and we evacuated the seroma.  I then was able to easily grasped the medial margin with Allis clamps and excised the entire medial margin going down to the chest wall.  This was sent to pathology for evaluation.  Hemostasis was achieved with the cautery.  At this point Dr. Ulice Bold continued her portion of the procedure continue with the bilateral oncoplastic breast reduction.  She remained stable and doing well under general anesthesia          [name]   Date: 12/13/2023  Time: 12:43 PM

## 2023-12-13 NOTE — Interval H&P Note (Signed)
 History and Physical Interval Note:no change in H and P  12/13/2023 11:44 AM  Jody [name]  has presented today for surgery, with the diagnosis of POSITIVE LEFT BREAST CANCER MARGIN STATUS POST LUMPECTOMY.  The various methods of treatment have been discussed with the patient and family. After consideration of risks, benefits and other options for treatment, the patient has consented to  Procedure(s) with comments: BREAST LUMPECTOMY (Left) - RE-EXCISION LEFT BREAST CANCER MAMMOPLASTY, REDUCTION (Bilateral) - ONCOPLASTIC BILATERAL BREAST REDUCTION WITH LIPSUCTION as a surgical intervention.  The patient's history has been reviewed, patient examined, no change in status, stable for surgery.  I have reviewed the patient's chart and labs.  Questions were answered to the patient's satisfaction.     [name]

## 2023-12-13 NOTE — Discharge Instructions (Addendum)
 INSTRUCTIONS FOR AFTER BREAST SURGERY   You will likely have some questions about what to expect following your operation.  The following information will help you and your family understand what to expect when you are discharged from the hospital.  It is important to follow these guidelines to help ensure a smooth recovery and reduce complication.  Postoperative instructions include information on: diet, wound care, medications and physical activity.  AFTER SURGERY Expect to go home after the procedure.  In some cases, you may need to spend one night in the hospital for observation.  DIET Breast surgery does not require a specific diet.  However, the healthier you eat the better your body will heal. It is important to increasing your protein intake.  This means limiting the foods with sugar and carbohydrates.  Focus on vegetables and some meat.  If you have liposuction during your procedure be sure to drink water.  If your urine is bright yellow, then it is concentrated, and you need to drink more water.  As a general rule after surgery, you should have 8 ounces of water every hour while awake.  If you find you are persistently nauseated or unable to take in liquids let us know.  NO TOBACCO USE or EXPOSURE.  This will slow your healing process and lead to a wound.  WOUND CARE Leave the binder on for 3 days . Use fragrance free soap like Dial, Dove or [place].   After 3 days you can remove the binder to shower. Once dry apply binder or sports bra. If you have liposuction you will have a soft and spongy dressing (Lipofoam) that helps prevent creases in your skin.  Remove before you shower and then replace it.  It is also available on [employer]. If you have steri-strips / tape directly attached to your skin leave them in place. It is OK to get these wet.   No baths, pools or hot tubs for four weeks. We close your incision to leave the smallest and best-looking scar. No ointment or creams on your incisions  for four weeks.  No Neosporin (Too many skin reactions).  A few weeks after surgery you can use Mederma and start massaging the scar. We ask you to wear your binder or sports bra for the first 6 weeks around the clock, including while sleeping. This provides added comfort and helps reduce the fluid accumulation at the surgery site. NO Ice or heating pads to the operative site.  You have a very high risk of a BURN before you feel the temperature change.  ACTIVITY No heavy lifting until cleared by the doctor.  This usually means no more than a half-gallon of milk.  It is OK to walk and climb stairs. Moving your legs is very important to decrease your risk of a blood clot.  It will also help keep you from getting deconditioned.  Every 1 to 2 hours get up and walk for 5 minutes. This will help with a quicker recovery back to normal.  Let pain be your guide so you don't do too much.  This time is for you to recover.  You will be more comfortable if you sleep and rest with your head elevated either with a few pillows under you or in a recliner.  No stomach sleeping for a three months.  WORK Everyone returns to work at different times. As a rough guide, most people take at least 1 - 2 weeks off prior to returning to work. If  you need documentation for your job, give the forms to the front staff at the clinic.  DRIVING Arrange for someone to bring you home from the hospital after your surgery.  You may be able to drive a few days after surgery but not while taking any narcotics or valium.  BOWEL MOVEMENTS Constipation can occur after anesthesia and while taking pain medication.  It is important to stay ahead for your comfort.  We recommend taking Milk of Magnesia (2 tablespoons; twice a day) while taking the pain pills.  MEDICATIONS You may be prescribed should start after surgery At your preoperative visit for you history and physical you may have been given the following medications: An antibiotic: Start  this medication when you get home and take according to the instructions on the bottle. Zofran 4 mg:  This is to treat nausea and vomiting.  You can take this every 6 hours as needed and only if needed. Valium 2 mg for breast cancer patients: This is for muscle tightness if you have an implant or expander. This will help relax your muscle which also helps with pain control.  This can be taken every 12 hours as needed. Don't drive after taking this medication. Norco (hydrocodone/acetaminophen) 5/325 mg:  This is only to be used after you have taken the Motrin or the Tylenol. Every 8 hours as needed.   Over the counter Medication to take: Ibuprofen (Motrin) 600 mg:  Take this every 6 hours.  If you have additional pain then take 500 mg of the Tylenol every 8 hours.  Only take the Norco after you have tried these two. MiraLAX or Milk of Magnesia: Take this according to the bottle if you take the Norco.  WHEN TO CALL Call your surgeon's office if any of the following occur: Fever 101 degrees F or greater Excessive bleeding or fluid from the incision site. Pain that increases over time without aid from the medications Redness, warmth, or pus draining from incision sites Persistent nausea or inability to take in liquids Severe misshapen area that underwent the operation.  Here are some resources for breast cancer patients:  Plastic surgery website: https://www.plasticsurgery.org/for-medical-professionals/education-and-resources/publications/breast-reconstruction-magazine Breast Reconstruction Awareness Campaign:  [employer]fr Plastic surgery Implant information:  https://www.plasticsurgery.org/patient-safety/breast-implant-safety    Post Anesthesia Home Care Instructions  Activity: Get plenty of rest for the remainder of the day. A responsible individual must stay with you for 24 hours following the procedure.  For the next 24 hours, DO NOT: -Drive a car -Social worker -Drink alcoholic beverages -Take any medication unless instructed by your physician -Make any legal decisions or sign important papers.  Meals: Start with liquid foods such as gelatin or soup. Progress to regular foods as tolerated. Avoid greasy, spicy, heavy foods. If nausea and/or vomiting occur, drink only clear liquids until the nausea and/or vomiting subsides. Call your physician if vomiting continues.  Special Instructions/Symptoms: Your throat may feel dry or sore from the anesthesia or the breathing tube placed in your throat during surgery. If this causes discomfort, gargle with warm salt water. The discomfort should disappear within 24 hours.  If you had a scopolamine patch placed behind your ear for the management of post- operative nausea and/or vomiting:  1. The medication in the patch is effective for 72 hours, after which it should be removed.  Wrap patch in a tissue and discard in the trash. Wash hands thoroughly with soap and water. 2. You may remove the patch earlier than 72 hours if you experience unpleasant  side effects which may include dry mouth, dizziness or visual disturbances. 3. Avoid touching the patch. Wash your hands with soap and water after contact with the patch.    Information for Discharge Teaching: EXPAREL (bupivacaine liposome injectable suspension)   Pain relief is important to your recovery. The goal is to control your pain so you can move easier and return to your normal activities as soon as possible after your procedure. Your physician may use several types of medicines to manage pain, swelling, and more.  Your surgeon or anesthesiologist gave you EXPAREL(bupivacaine) to help control your pain after surgery.  EXPAREL is a local anesthetic designed to release slowly over an extended period of time to provide pain relief by numbing the tissue around the surgical site. EXPAREL is designed to release pain medication over time and can control  pain for up to 72 hours. Depending on how you respond to EXPAREL, you may require less pain medication during your recovery. EXPAREL can help reduce or eliminate the need for opioids during the first few days after surgery when pain relief is needed the most. EXPAREL is not an opioid and is not addictive. It does not cause sleepiness or sedation.   Important! A teal colored band has been placed on your arm with the date, time and amount of EXPAREL you have received. Please leave this armband in place for the full 96 hours following administration, and then you may remove the band. If you return to the hospital for any reason within 96 hours following the administration of EXPAREL, the armband provides important information that your health care providers to know, and alerts them that you have received this anesthetic.    Possible side effects of EXPAREL: Temporary loss of sensation or ability to move in the area where medication was injected. Nausea, vomiting, constipation Rarely, numbness and tingling in your mouth or lips, lightheadedness, or anxiety may occur. Call your doctor right away if you think you may be experiencing any of these sensations, or if you have other questions regarding possible side effects.  Follow all other discharge instructions given to you by your surgeon or nurse. Eat a healthy diet and drink plenty of water or other fluids.  Next dose of tylenol if needed is at 5pm

## 2023-12-13 NOTE — Op Note (Signed)
 Breast Reduction Op note:    DATE OF PROCEDURE: 12/13/2023  LOCATION: [facility]  SURGEON: Foster Simpson, DO  ASSISTANT: Keenan Bachelor, PA  PREOPERATIVE DIAGNOSIS 1. Left breast cancer 2. Macromastia, Neck Pain, and Back Pain  POSTOPERATIVE DIAGNOSIS 1. Same as preoperative diagnosis  PROCEDURES 1. Oncoplastic Bilateral breast reduction.  Right reduction 850 g, Left reduction 900 g  COMPLICATIONS: None.  DRAINS: none  INDICATIONS FOR PROCEDURE Jody [name] is a 53 y.o. year-old female born on [birth year],with a history of symptomatic macromastia with concomitant back pain, neck pain, shoulder grooving from her bra. She was also diagnosed with left breast cancer.  She underwent a partial mastectomy and ended up having positive margins.  The decision was made to coordinate with general surgery to perform a reduction at the same time for symmetry. MRN: [id number]  CONSENT Informed consent was obtained directly from the patient. The risks, benefits and alternatives were fully discussed. Specific risks including but not limited to bleeding, infection, hematoma, seroma, scarring, pain, nipple necrosis, asymmetry, poor cosmetic results, and need for further surgery were discussed. The patient's questions were answered.  DESCRIPTION OF PROCEDURE  Patient was brought into the operating room and rested on the operating room table in the supine position.  SCDs were placed and appropriate padding was performed.  Antibiotics were given. The patient underwent general anesthesia and the chest was prepped and draped in a sterile fashion.  A timeout was performed and all information was confirmed to be correct by those in the room. Tumescent was placed in the lateral breast and liposuction done laterally for improved contour laterally.   Right side: Preoperative markings were confirmed.  Incision lines were injected with local containing epinephrine.  After waiting  for vasoconstriction, the marked lines were incised with a #15 blade.  A Wise-pattern superomedial breast reduction was performed by de-epithelializing the pedicle, using bovie to create the superomedial pedicle, and removing breast tissue from the superior, lateral, and inferior portions of the breast.  Care was taken to not undermine the breast pedicle. Hemostasis was achieved. Experel and Cellerate were placed in the pocket.   The nipple was gently rotated into position and the soft tissue closed with 4-0 Monocryl.   The pocket was irrigated and hemostasis confirmed.  The deep tissues were approximated with 3-0 PDS sutures.  The skin was closed with deep dermal 3-0 Monocryl and subcuticular 4-0 Monocryl sutures.  The nipple and skin flaps had good capillary refill at the end of the procedure.    Left side: Preoperative markings were confirmed.  Incision lines were injected with local containing epinephrine.  After waiting for vasoconstriction, the marked lines were incised with a #15 blade.  A Wise-pattern superomedial breast reduction was performed by de-epithelializing the pedicle, using bovie to create the superomedial pedicle, and removing breast tissue from the superior, lateral, and inferior portions of the breast.  Care was taken to not undermine the breast pedicle. Hemostasis was achieved. Experel and cellerate were placed in the pocket.  The nipple was gently rotated into position and the soft tissue was closed with 4-0 Monocryl.  The patient was sat upright and size and shape symmetry was confirmed.  The pocket was irrigated and hemostasis confirmed.  I saw the pocket but did not connect with the partial mastectomy pocket. The deep tissues were approximated with 3-0 PDS sutures. The skin was closed with deep dermal 3-0 Monocryl and subcuticular 4-0 Monocryl sutures.  Dermabond was applied.  A breast  binder and ABDs were placed.  The nipple and skin flaps had good capillary refill at the end of the  procedure.  The patient tolerated the procedure well. The patient was allowed to wake from anesthesia and taken to the recovery room in satisfactory condition.  The advanced practice practitioner (APP) assisted throughout the case.  The APP was essential in retraction and counter traction when needed to make the case progress smoothly.  This retraction and assistance made it possible to see the tissue plans for the procedure.  The assistance was needed for blood control, tissue re-approximation and assisted with closure of the incision site.

## 2023-12-13 NOTE — Anesthesia Postprocedure Evaluation (Signed)
 Anesthesia Post Note  Patient: Forensic scientist  Procedure(s) Performed: BREAST LUMPECTOMY (Left: Breast) MAMMOPLASTY, REDUCTION (Bilateral: Breast)     Patient location during evaluation: PACU Anesthesia Type: General Level of consciousness: awake and alert Pain management: pain level controlled Vital Signs Assessment: post-procedure vital signs reviewed and stable Respiratory status: spontaneous breathing, nonlabored ventilation and respiratory function stable Cardiovascular status: blood pressure returned to baseline and stable Postop Assessment: no apparent nausea or vomiting Anesthetic complications: no   No notable events documented.  Last Vitals:  Vitals:   12/13/23 1453 12/13/23 1500  BP:  125/76  Pulse:  75  Resp:  (!) 8  Temp:    SpO2: 97% 97%    Last Pain:  Vitals:   12/13/23 1456  TempSrc:   PainSc: 6                  Collene Schlichter

## 2023-12-13 NOTE — Anesthesia Preprocedure Evaluation (Addendum)
 Anesthesia Evaluation  Patient identified by MRN, date of birth, ID band Patient awake    Reviewed: Allergy & Precautions, NPO status , Patient's Chart, lab work & pertinent test results, reviewed documented beta blocker date and time   Airway Mallampati: II  TM Distance: >3 FB Neck ROM: Full    Dental  (+) Dental Advisory Given, Missing, Chipped,    Pulmonary asthma    Pulmonary exam normal breath sounds clear to auscultation       Cardiovascular hypertension, Pt. on home beta blockers (-) angina (-) Past MI Normal cardiovascular exam Rhythm:Regular Rate:Normal     Neuro/Psych  Headaches PSYCHIATRIC DISORDERS Anxiety Depression       GI/Hepatic Neg liver ROS,GERD  Medicated and Controlled,,  Endo/Other  Obesity   Renal/GU negative Renal ROS     Musculoskeletal negative musculoskeletal ROS (+)    Abdominal   Peds  Hematology negative hematology ROS (+)   Anesthesia Other Findings Day of surgery medications reviewed with the patient.  Invasive ductal carcinoma of left breast  Reproductive/Obstetrics                             Anesthesia Physical Anesthesia Plan  ASA: 2  Anesthesia Plan: General   Post-op Pain Management: Tylenol PO (pre-op)* and Gabapentin PO (pre-op)*   Induction: Intravenous  PONV Risk Score and Plan: 3 and Scopolamine patch - Pre-op, Midazolam, Dexamethasone and Ondansetron  Airway Management Planned: Oral ETT  Additional Equipment:   Intra-op Plan:   Post-operative Plan: Extubation in OR  Informed Consent: I have reviewed the patients History and Physical, chart, labs and discussed the procedure including the risks, benefits and alternatives for the proposed anesthesia with the patient or authorized representative who has indicated his/her understanding and acceptance.     Dental advisory given  Plan Discussed with: CRNA  Anesthesia Plan Comments:          Anesthesia Quick Evaluation

## 2023-12-14 ENCOUNTER — Encounter (HOSPITAL_BASED_OUTPATIENT_CLINIC_OR_DEPARTMENT_OTHER): Payer: Self-pay | Admitting: Surgery

## 2023-12-15 LAB — SURGICAL PATHOLOGY

## 2023-12-17 ENCOUNTER — Encounter: Payer: Self-pay | Admitting: *Deleted

## 2023-12-21 ENCOUNTER — Ambulatory Visit: Admitting: Plastic Surgery

## 2023-12-21 ENCOUNTER — Encounter: Payer: Self-pay | Admitting: Plastic Surgery

## 2023-12-21 VITALS — BP 138/85 | HR 66

## 2023-12-21 DIAGNOSIS — Z170 Estrogen receptor positive status [ER+]: Secondary | ICD-10-CM

## 2023-12-21 DIAGNOSIS — C50412 Malignant neoplasm of upper-outer quadrant of left female breast: Secondary | ICD-10-CM

## 2023-12-21 NOTE — Progress Notes (Signed)
 The patient is a 53 year old female here for follow-up after undergoing oncoplastic breast reduction.  She had about 40 cc of fluid in her partial mastectomy site on the left breast.  It was serosanguineous.  I went ahead and drained it.  It did not look worrisome.  No sign of infection.  Overall she is doing really well.  Continue with the sports bra and leave the Steri-Strips in place.  Pictures at next visit.

## 2023-12-23 ENCOUNTER — Inpatient Hospital Stay: Attending: Hematology | Admitting: Licensed Clinical Social Worker

## 2023-12-23 ENCOUNTER — Inpatient Hospital Stay: Admitting: Licensed Clinical Social Worker

## 2023-12-23 DIAGNOSIS — Z1732 Human epidermal growth factor receptor 2 negative status: Secondary | ICD-10-CM | POA: Insufficient documentation

## 2023-12-23 DIAGNOSIS — Z170 Estrogen receptor positive status [ER+]: Secondary | ICD-10-CM

## 2023-12-23 DIAGNOSIS — Z1721 Progesterone receptor positive status: Secondary | ICD-10-CM | POA: Insufficient documentation

## 2023-12-23 DIAGNOSIS — C50412 Malignant neoplasm of upper-outer quadrant of left female breast: Secondary | ICD-10-CM | POA: Insufficient documentation

## 2023-12-23 NOTE — Progress Notes (Signed)
 [facility] CSW Progress Note  Clinical Child psychotherapist  corrected the date of the lumpectomy on the letter sent to pt for the [employer] and resent to pt.        Rachel Moulds, LCSW Clinical Social Worker [facility]    Patient is participating in a Managed Medicaid Plan:  Yes

## 2023-12-23 NOTE — Progress Notes (Signed)
 [facility] CSW Progress Note  Visual merchandiser  received a call from pt requesting the medical form for the [employer] be completed.  Form completed and letter on [facility] letterhead composed.  Both emailed to pt by CSW.  CSW to remain available as appropriate throughout duration of treatment.       Rachel Moulds, LCSW Clinical Social Worker [facility]    Patient is participating in a Managed Medicaid Plan:  Yes

## 2023-12-31 ENCOUNTER — Other Ambulatory Visit: Payer: Self-pay | Admitting: Family Medicine

## 2023-12-31 DIAGNOSIS — R1013 Epigastric pain: Secondary | ICD-10-CM

## 2023-12-31 DIAGNOSIS — T7840XA Allergy, unspecified, initial encounter: Secondary | ICD-10-CM

## 2024-01-04 NOTE — Progress Notes (Signed)
 Nursing interview for a diagnosis of Malignant neoplasm of upper-outer quadrant of left breast in female, estrogen receptor positive (HCC).   Patient identity verified x2.  Patient was diagnosed through a screening mammogram. Her mammogram on 08/20/2023 showed left breast calcifications.    Intent: Curative Location: LT Breast   Patient states "occasional tenderness LT breast 5/10, located at incision line, w/ swelling."    Pain: 5/10 as stated above ROM: Full bilaterally Lymphedema: Mild Cardiac issue: Denies Lung issue: Denies Appetite: Good   All pertinent body systems reviewed w/ patient. Patient denies any other related issues at this time.   Meaningful use complete.   Vitals- BP (!) 166/89 (BP Location: Right Arm, Patient Position: Sitting, Cuff Size: Normal)   Pulse 70   Temp (!) 97.5 F (36.4 C) (Temporal)   Resp 18   Ht 5\' 4"  (1.626 m)   Wt 231 lb 4 oz (104.9 kg)   LMP 11/21/2023   SpO2 100%   BMI 39.69 kg/m    This concludes the interaction.     Additional Complaints / other details:    SAFETY ISSUES: Prior radiation? No Pacemaker/ICD? No Possible current pregnancy? NO- Tubal ligation and NEGATIVE hcg on 12/13/2023. LMP 11/21/2023. Is the patient on methotrexate? No   Family History of Breast/Ovarian/Prostate Cancer: Yes- Mother- Breast cancer- age of onset- 54yrs.   Medical oncologist, treatment if any: Dr. Maryalice Smaller 10/20/2023 Treatment plan:  -Breast MRI with and without contrast  -She will proceed with lumpectomy and sentinel lymph node biopsy show, and breast reduction -Oncotype on her surgical sample -I will see her after radiation, or sooner if Oncotype shows high risk disease. -Genetic referral -Will refer for clinical trial   Screening Mammogram on 08/20/2023: IMPRESSION: Further evaluation is suggested for calcifications in the left breast.  Diagnostic Mammogram on 10/01/2023: FINDINGS: Magnification views of the left breast were obtained. Within  the upper-outer left breast middle to posterior depth there is a 5.1 cm linearly oriented group of pleomorphic calcifications. IMPRESSION Suspicious left breast calcifications.   Surgeon and surgical plan, if any: Dr. Lucienne Ryder Lumpectomy and sentinel lymph node biopsy 11/17/2023, and breast reduction by Dr. Gilles Lacks 12/13/2023.  Histology per Pathology Report: Positive left breast cancer margin status post mastectomy (cm)   Pathology Results 12/13/2023:  FINAL MICROSCOPIC DIAGNOSIS:   A. BREAST, LEFT MEDIAL MARGIN, RE-EXCISION:  - Benign breast parenchyma with previous procedure-related changes  - Negative for carcinoma   B. BREAST, LEFT, MAMMOPLASTY:  - Benign breast parenchyma with previous procedure-related changes  - Negative for carcinoma   C. BREAST, RIGHT, MAMMOPLASTY:  - Benign breast parenchyma with previous procedure-related changes  - Negative for carcinoma    Avery Bodo, LPN

## 2024-01-05 ENCOUNTER — Encounter: Admitting: Surgical

## 2024-01-05 ENCOUNTER — Ambulatory Visit (INDEPENDENT_AMBULATORY_CARE_PROVIDER_SITE_OTHER): Admitting: Surgical

## 2024-01-05 VITALS — BP 133/85 | HR 65

## 2024-01-05 DIAGNOSIS — Z170 Estrogen receptor positive status [ER+]: Secondary | ICD-10-CM

## 2024-01-05 DIAGNOSIS — C50412 Malignant neoplasm of upper-outer quadrant of left female breast: Secondary | ICD-10-CM

## 2024-01-05 NOTE — Progress Notes (Signed)
 Patient is a 53 year old female here for follow-up after bilateral oncoplastic breast reduction on 12/13/2023 in conjunction with left breast lumpectomy with Dr. Lucienne Ryder.  She was last seen in the office on 12/21/2023.  She was overall doing well at that time, did have fluid aspirated from the left breast, 40 cc was collected.  She reports she is overall doing well, she has not been very diligent with wearing compressive garments.  She has a shirt/bra combination with her today, but it does not have much compression.  Chaperone present on exam On exam bilateral NAC's are viable, bilateral breast incisions are intact.  There is no signs of infection or concern on exam.  She does have some postsurgical swelling, but no tension on the incisions, no significant subcutaneous fluid collection noted with palpation.  There is minimal tenderness with palpation.  No erythema or cellulitic changes.  A/P:  Patient is a 53 year old female here for follow-up after oncoplastic breast reduction.  She overall seems to be healing well, she does not have any signs of infection or concern on exam.  She does have some postsurgical swelling, some possible small subcutaneous fluid collection, however I discussed with patient the importance of compressive garments.  She has not been wearing breast binder or sports bra.  Recommend restarting with breast binder or sports bra for improved compression.  Recommend continuing to monitor bilateral breast for increased swelling, she may need to undergo needle aspiration again in the office or ultrasound with placement of drain depending on improvement.  Pictures were obtained of the patient and placed in the chart with the patient's or guardian's permission.

## 2024-01-06 ENCOUNTER — Ambulatory Visit
Admission: RE | Admit: 2024-01-06 | Discharge: 2024-01-06 | Disposition: A | Discharge status: Home / Self Care | Source: Ambulatory Visit | Attending: Radiation Oncology | Admitting: Radiation Oncology

## 2024-01-06 ENCOUNTER — Inpatient Hospital Stay: Admitting: Licensed Clinical Social Worker

## 2024-01-06 ENCOUNTER — Ambulatory Visit: Admitting: Radiation Oncology

## 2024-01-06 ENCOUNTER — Encounter: Payer: Self-pay | Admitting: Radiation Oncology

## 2024-01-06 VITALS — BP 166/89 | HR 70 | Temp 97.5°F | Resp 18 | Ht 64.0 in | Wt 231.2 lb

## 2024-01-06 DIAGNOSIS — Z170 Estrogen receptor positive status [ER+]: Secondary | ICD-10-CM

## 2024-01-06 NOTE — Progress Notes (Signed)
 [facility] CSW Progress Note  Clinical Child psychotherapist received an email from [employer] requesting the medical form be submitted for pt.  Medical form submitted on behalf of pt.  CSW met w/ pt in radiation to inform of the above.  Pt also provided w/ contact information fot the patient financial resource specialist to follow up on the [employer].      Quenton Bruns, LCSW Clinical Social Worker  [facility]    Patient is participating in a Managed Medicaid Plan:  Yes

## 2024-01-06 NOTE — Therapy (Addendum)
 OUTPATIENT PHYSICAL THERAPY BREAST CANCER POST OP FOLLOW UP   Patient Name: Jody [name] MRN: [id number] DOB:1971/06/15, 53 y.o., female Today's Date: 01/07/2024  END OF SESSION:  PT End of Session - 01/07/24 1059     Visit Number 3    Number of Visits 9    Date for PT Re-Evaluation 01/28/24    Authorization Type [employer] authorized 4 visits 01/07/24-02/06/24    Authorization - Visit Number 1    Authorization - Number of Visits 4    PT Start Time 1100    PT Stop Time 1145    PT Time Calculation (min) 45 min    Activity Tolerance Patient tolerated treatment well    Behavior During Therapy WFL for tasks assessed/performed              Past Medical History:  Diagnosis Date   Anemia    Anxiety    Asthma    Breast cancer (HCC)    Depression    GERD (gastroesophageal reflux disease)    History of anal fissures    History of gallstones    Hypertension    Migraine headache with aura    Migraines    without aura   Obesity    Past Surgical History:  Procedure Laterality Date   BREAST BIOPSY Left 10/08/2023   MM LT BREAST BX W LOC DEV 1ST LESION IMAGE BX SPEC STEREO GUIDE 10/08/2023 [facility]   BREAST BIOPSY Left 10/08/2023   MM LT BREAST BX W LOC DEV EA AD LESION IMG BX SPEC STEREO GUIDE 10/08/2023 [facility]   BREAST BIOPSY  11/16/2023   MM LT RADIOACTIVE SEED LOC MAMMO GUIDE 11/16/2023 [facility]   BREAST BIOPSY  11/16/2023   MM LT RADIOACTIVE SEED EA ADD LESION LOC MAMMO GUIDE 11/16/2023 [facility]   BREAST LUMPECTOMY Left 12/13/2023   Procedure: BREAST LUMPECTOMY;  Surgeon: Oza Blumenthal, MD;  Location: [facility];  Service: General;  Laterality: Left;  RE-EXCISION LEFT BREAST CANCER   BREAST LUMPECTOMY WITH RADIOACTIVE SEED AND SENTINEL LYMPH NODE BIOPSY Left 11/17/2023   Procedure: LEFT BREAST SEED GUIDED BRACKETED LUMPECTOMY AND SENTINEL LYMPH NODE BIOPSY;  Surgeon: Oza Blumenthal, MD;  Location: [facility];  Service: General;  Laterality: Left;   BREAST REDUCTION SURGERY Bilateral 12/13/2023   Procedure: MAMMOPLASTY, REDUCTION;  Surgeon: Thornell Flirt, DO;  Location: [facility];  Service: Plastics;  Laterality: Bilateral;  ONCOPLASTIC BILATERAL BREAST REDUCTION WITH LIPSUCTION   CHOLECYSTECTOMY     ESOPHAGOGASTRODUODENOSCOPY     FLEXIBLE SIGMOIDOSCOPY  03/09/2012   Procedure: FLEXIBLE SIGMOIDOSCOPY;  Surgeon: Claudette Cue, MD;  Location: [facility];  Service: Endoscopy;  Laterality: N/A;   gastric sleeve surgery  03/14/2021   TUBAL LIGATION     Essure   Patient Active Problem List   Diagnosis Date Noted   Well woman exam with routine gynecological exam 11/12/2023   Perimenopause 11/12/2023   Anxiety 11/12/2023   Genetic testing 11/02/2023   Malignant neoplasm of upper-outer quadrant of left breast in female, estrogen receptor positive (HCC) 10/18/2023   Bronchitis 12/21/2022   Colon cancer screening 12/21/2022   Allergies 12/21/2022   Need for hepatitis C screening test 12/21/2022   Abnormal vaginal bleeding 12/30/2020   Endometriosis 08/21/2019   Low back pain with radiation 04/28/2019   Stress reaction 05/16/2017   Essential hypertension 06/18/2015   Hyperglycemia 06/18/2015   Hemorrhoids 01/31/2012   Fatigue 12/31/2011   Dyspepsia 12/31/2011  B12 deficiency 01/13/2011   Anxiety and depression 12/26/2010   Migraines 12/15/2010   Stress 12/15/2010   OBESITY, MORBID 04/04/2007    PCP: Roel Clarity, DO  REFERRING PROVIDER: Sonja [place], MD  REFERRING DIAG: Left breast cancer   THERAPY DIAG:  Malignant neoplasm of upper-outer quadrant of left breast in female, estrogen receptor positive (HCC)  Abnormal posture  Aftercare following surgery for neoplasm  At risk for lymphedema  Rationale for Evaluation and Treatment: Rehabilitation  ONSET DATE: 10/20/23  SUBJECTIVE:                                                                                                                                                                                            SUBJECTIVE STATEMENT: It is sore and still swollen.  I went yesterday to see about my radiation but they said I was still swollen.  They both feel swollen.  I have my compression on but I'm not sure if its too big now.    PERTINENT HISTORY:  Patient was diagnosed on 10/20/23 with left grade IDC with DCIS measures 5.1 cm and is located in the upper outer quadrant. It is ER/PR positive, HER2 neg with a Ki67 of 5%. Lt breast lumpectomy with SLNB on 11/17/23 with 5 negative nodes removed.  re-excision and  a reduction with Dr. Orin Birk  12/13/23.  Then will have radiation.    PATIENT GOALS:  Reassess how my recovery is going related to arm function, pain, and swelling.  PAIN:  Are you having pain? Yes: NPRS scale: 4 Pain location: both breasts  Pain description: like a cold chill that comes and goes  Aggravating factors: Nothing really Relieving factors: medication   PRECAUTIONS: Recent Surgery, left UE Lymphedema risk  RED FLAGS: None   ACTIVITY LEVEL / LEISURE: I'm walking my dog now or walking outside.  I am back to doing my chores.     OBJECTIVE:  PATIENT SURVEYS:  QUICK DASH: 55% from 59% baseline   OBSERVATIONS: Still wearing compression but it is from before the surgery and has a lot of extra fabric which can allow for more edema, healed incisions with 1 stitch popping out under the left breast and steristrips still present across both vertical breast incisions. No cording  POSTURE:  Rounded shoulders   LYMPHEDEMA ASSESSMENT:   UPPER EXTREMITY AROM/PROM:   A/PROM RIGHT   eval   01/07/24 All motions pulling in breast mildly  Shoulder extension 45 50  Shoulder flexion 140 140  Shoulder abduction 155 155  Shoulder internal rotation 50 50  Shoulder external rotation 80 75                          (  Blank rows = not tested)   A/PROM LEFT   Eval - the left  arm had pain with all the movements in the shoulder 4/10 12/06/23 left arm had pain with all the movements in the shoulder 4/10 01/07/24  Shoulder extension 45 45 45  Shoulder flexion 145 150 110 - pulling  / 160  Shoulder abduction 150 150 77 / 160  Shoulder internal rotation 60 45- "feels heavy"   Shoulder external rotation 70 50 - "feels heavy" 15 - pain  / 80                          (Blank rows = not tested)   CERVICAL AROM: All within normal limits:    LYMPHEDEMA ASSESSMENTS (in cm):    LANDMARK RIGHT   eval  10 cm proximal to olecranon process 39.8  Olecranon process 28.2  10 cm proximal to ulnar styloid process 22.5  Just proximal to ulnar styloid process 17.2  Across hand at thumb web space 19.8  At base of 2nd digit 6.5  (Blank rows = not tested)   LANDMARK LEFT   eval 12/06/23  10 cm proximal to olecranon process 40 40  Olecranon process 29 29  10  cm proximal to ulnar styloid process 23 23.4  Just proximal to ulnar styloid process 17 17  Across hand at thumb web space 20.2 20.2  At base of 2nd digit 6.5 6.5  (Blank rows = not tested)  TODAY"S TREATMENT 01/07/24 Re-eval performed Education on new compression, gave new prescription and tried to [facility] to [facility] but they were not answering so I encouraged her to try and stop by on the way home as she lives out of [place].   Reviewed and updated HEP per below  Tried both wall walking and dowel exercises and walll was better for her as she had too much scapular elevation with a dowel.   Discussed POC  PATIENT EDUCATION:  Education details: per today's note Person educated: Patient Education method: Explanation, Demonstration, Verbal cues, and Handouts Education comprehension: verbalized understanding  HOME EXERCISE PROGRAM: Reviewed previously given post op HEP. Access Code: [email or web address] URL: [email or web address] Date: 01/07/2024 Prepared by: Judy Null  Program Notes Here are the  suggested stretches for after breast reduction surgery. You can start these after your drain(s) has been removed. Remember to avoid significant pain. Start walking immediately after surgery.   Exercises - Correct Seated Posture  - Seated Diaphragmatic Breathing  - 3 x daily - 7 x weekly - 10 reps - Shoulder External Rotation and Scapular Retraction  - 3 x daily - 7 x weekly - 5 reps - 3 seconds hold - Seated Chest Stretch with Hands Behind Head  - 3 x daily - 7 x weekly - 3 reps - 5 second hold - Standing Shoulder Flexion Wall Walk  - 3 x daily - 7 x weekly - 3 reps - 3 seconds hold - Standing Shoulder Abduction Finger Walk at Wall  - 3 x daily - 7 x weekly - 3 reps - 3 seconds hold - Supine Shoulder Flexion AAROM with Hands Clasped  - 1 x daily - 7 x weekly - 1-3 sets - 10 reps - 3 seconds hold  ASSESSMENT:  CLINICAL IMPRESSION: Pt has decreased Left arm AROM and had to delay radiation due to left breast edema.  The left breast has signs of lymphedema with enlarged pores, decreased skin movement, and does have  some slight heat.  She will monitor this but her MD saw her recently.  Note was sent to [facility] in plastic surgery to okay for MLD but pt will benefit from compression improvements, MLD, PROM, and AAROM to improve movement and decrease pain.  Pt will benefit from skilled therapeutic intervention to improve on t/he following deficits: Decreased knowledge of precautions, impaired UE functional use, pain, decreased ROM, postural dysfunction.  PT treatment/interventions: ADL/Self care home management, 97110-Therapeutic exercises, 97535- Self Care, and 16109- Manual therapy   GOALS: Goals reviewed with patient? Yes  LONG TERM GOALS:  (STG=LTG)  GOALS Name Target Date  Goal status  1 Pt will demonstrate she has regained full shoulder ROM and function post operatively compared to baselines.  Baseline: 01/28/24 ONGOING  2 Pt will obtain better compression for the left breast  01/28/24 NEW  3 Pt will be in with MLD for the Left breast if indicated 01/28/24 NEW  4        PLAN:  PT FREQUENCY/DURATION: 1-2x per week x 3 weeks   PLAN FOR NEXT SESSION: lt breast MLD if okay with Plastic sugery, AAROM/pulleys, PROM, get new bra? *MD says light massage is fine 01/10/24 - kT   [facility]  [address]  [place] [address]  [phone]  After Breast Cancer Class Video It is recommended you view the ABC class video to be educated on lymphedema risk reduction. This video lasts for about 30 minutes. It can be viewed on our website here: [email or web address]  Scar massage You can begin gentle scar massage to you incision sites. Gently place one hand on the incision and move the skin (without sliding on the skin) in various directions. Do this for a few minutes and then you can gently massage either coconut oil or vitamin E cream into the scars.  Compression garment You should continue wearing your compression bra until you feel like you no longer have swelling.  Home exercise Program Continue doing the exercises you were given until you feel like you can do them without feeling any tightness at the end.   Walking Program Studies show that 30 minutes of walking per day (fast enough to elevate your heart rate) can significantly reduce the risk of a cancer recurrence. If you can't walk due to other medical reasons, we encourage you to find another activity you could do (like a stationary bike or water exercise).  Posture After breast cancer surgery, people frequently sit with rounded shoulders posture because it puts their incisions on slack and feels better. If you sit like this and scar tissue forms in that position, you can become very tight and have pain sitting or standing with good posture. Try to be aware of your posture and sit and stand up tall to heal  properly.  Follow up PT: It is recommended you return every 3 months for the first 3 years following surgery to be assessed on the SOZO machine for an L-Dex score. This helps prevent clinically significant lymphedema in 95% of patients. These follow up screens are 10 minute appointments that you are not billed for.  Encarnacion Harris, PT 01/07/2024, 12:06 PM

## 2024-01-06 NOTE — Progress Notes (Signed)
 Radiation Oncology         [phone] ________________________________  Name: Jody [name]        MRN: [id number]  Date of Service: 01/06/2024 DOB: 11/15/1970  GM:WNUUV Chase, Candida Chalk, DO  Sonja Leonard, MD     REFERRING PHYSICIAN: Sonja Peekskill, MD   DIAGNOSIS: The encounter diagnosis was Malignant neoplasm of upper-outer quadrant of left breast in female, estrogen receptor positive (HCC).   HISTORY OF PRESENT ILLNESS: Jody [name] is a 53 y.o. female seen in the multidisciplinary breast clinic for a new diagnosis of left breast cancer. The patient was noted to have a screening detected mammogram on 08/20/2023 that showed left breast calcifications.  She had further diagnostic workup on 09/26/1723, additional diagnostic mammography showed pleomorphic calcifications measuring 5.1 cm in the upper outer quadrant of the left breast.  She underwent stereotactic biopsy on 10/08/2023 which showed benign tissue in the anterior specimen, and the coil marked specimen documented as posterior lateral showed grade 2 invasive ductal carcinoma with associated high-grade DCIS with necrosis.  Her cancer was ER/PR positive, HER2 negative with a Ki-67 of 5%.    Since her last visit, the patient underwent an MRI of bilateral breasts with and without contrast on 11/01/2023 which showed no evidence of abnormalities in the right breast but confirmed biopsy marking clips in the posterior aspect of an area of non-mass enhancement measuring up to 4.1 cm no evidence of abnormal lymph nodes were appreciated and she underwent left bracketed lumpectomy with sentinel lymph node biopsy on 11/17/2023 with Dr. Lucienne Ryder.  This revealed grade 3 invasive ductal carcinoma measuring 1.4 cm in greatest dimension with associated high-grade DCIS, her margins were negative for invasive disease but positive for DCIS at the medial margin.  5 sentinel lymph nodes were sampled and all were negative for disease.  An Oncotype Dx score was  performed on her specimen and the recurrence score result was 17 so no systemic chemo is recommended by Dr. Maryalice Smaller.  Her Oncotype DX score confirmed that her tumor was ER/PR positive HER2 negative, and she went for reexcision of her medial margin on 12/13/2023 as well as mammoplasty for bilateral breasts.  Her left medial margin reexcision showed benign breast parenchyma and the additional specimen from her left breast mammoplasty was also consistent with benign tissue as well as the right breast mammoplasty specimen.  She has been recovering from surgery and is seen to consider adjuvant radiotherapy to the left breast with deep inspiration breath-hold technique.  PREVIOUS RADIATION THERAPY: No   PAST MEDICAL HISTORY:  Past Medical History:  Diagnosis Date   Anemia    Anxiety    Asthma    Breast cancer (HCC)    Depression    GERD (gastroesophageal reflux disease)    History of anal fissures    History of gallstones    Hypertension    Migraine headache with aura    Migraines    without aura   Obesity        PAST SURGICAL HISTORY: Past Surgical History:  Procedure Laterality Date   BREAST BIOPSY Left 10/08/2023   MM LT BREAST BX W LOC DEV 1ST LESION IMAGE BX SPEC STEREO GUIDE 10/08/2023 [facility]   BREAST BIOPSY Left 10/08/2023   MM LT BREAST BX W LOC DEV EA AD LESION IMG BX SPEC STEREO GUIDE 10/08/2023 [facility]   BREAST BIOPSY  11/16/2023   MM LT RADIOACTIVE SEED LOC MAMMO GUIDE 11/16/2023 [facility]   BREAST  BIOPSY  11/16/2023   MM LT RADIOACTIVE SEED EA ADD LESION LOC MAMMO GUIDE 11/16/2023 [facility]   BREAST LUMPECTOMY Left 12/13/2023   Procedure: BREAST LUMPECTOMY;  Surgeon: Oza Blumenthal, MD;  Location: [facility];  Service: General;  Laterality: Left;  RE-EXCISION LEFT BREAST CANCER   BREAST LUMPECTOMY WITH RADIOACTIVE SEED AND SENTINEL LYMPH NODE BIOPSY Left 11/17/2023   Procedure: LEFT BREAST SEED GUIDED BRACKETED LUMPECTOMY AND  SENTINEL LYMPH NODE BIOPSY;  Surgeon: Oza Blumenthal, MD;  Location: [facility];  Service: General;  Laterality: Left;   BREAST REDUCTION SURGERY Bilateral 12/13/2023   Procedure: MAMMOPLASTY, REDUCTION;  Surgeon: Thornell Flirt, DO;  Location: [facility];  Service: Plastics;  Laterality: Bilateral;  ONCOPLASTIC BILATERAL BREAST REDUCTION WITH LIPSUCTION   CHOLECYSTECTOMY     ESOPHAGOGASTRODUODENOSCOPY     FLEXIBLE SIGMOIDOSCOPY  03/09/2012   Procedure: FLEXIBLE SIGMOIDOSCOPY;  Surgeon: Claudette Cue, MD;  Location: [facility];  Service: Endoscopy;  Laterality: N/A;   gastric sleeve surgery  03/14/2021   TUBAL LIGATION     Essure     FAMILY HISTORY:  Family History  Problem Relation Age of Onset   Breast cancer Mother 8       no genetic testing   Hypertension Mother    Hepatitis Father 61       Hep C   Diabetes Father    Arthritis Father    Hypertension Father    Kidney disease Father    Heart attack Father 54   Breast cancer Maternal Aunt        dx late 65s; mother's mat half sister   Cancer Maternal Uncle        x2 mat uncles; unk type; dx >50   Coronary artery disease Maternal Grandmother    Cataracts Maternal Grandmother    Glaucoma Maternal Grandmother    Heart disease Maternal Grandmother    Colon cancer Paternal Grandmother        dx >50   Obesity Paternal Grandmother    Cancer Cousin        unk type; dx 33s; mat female cousin   Obesity Other    Stomach cancer Neg Hx    Rectal cancer Neg Hx      SOCIAL HISTORY:  reports that she has never smoked. She has never used smokeless tobacco. She reports that she does not drink alcohol and does not use drugs.  The patient is single and lives in [place], [place] . She is accompanied by her mother.    ALLERGIES: Patient has no known allergies.   MEDICATIONS:  Current Outpatient Medications  Medication Sig Dispense Refill   albuterol  (VENTOLIN  HFA) 108 (90 Base)  MCG/ACT inhaler INHALE 2 PUFFS 4 TIMES A DAY AS NEEDED 18 each 1   fluticasone  (FLONASE ) 50 MCG/ACT nasal spray SPRAY 2 SPRAYS INTO EACH NOSTRIL EVERY DAY 48 mL 1   hydrocortisone  (ANUSOL -HC) 25 MG suppository Place 1 suppository (25 mg total) rectally 2 (two) times daily as needed for hemorrhoids or anal itching. 24 suppository 0   LORazepam  (ATIVAN ) 1 MG tablet Take 0.5 tablets (0.5 mg total) by mouth daily as needed for anxiety. 30 tablet 0   metoprolol  tartrate (LOPRESSOR ) 50 MG tablet Take 1 tablet (50 mg total) by mouth 2 (two) times daily. 180 tablet 1   Multiple Vitamin (MULTI VITAMIN PO) Take by mouth.     traMADol  (ULTRAM ) 50 MG tablet Take 1 tablet (50 mg total) by mouth every  6 (six) hours as needed. 20 tablet 0   famotidine  (PEPCID ) 20 MG tablet TAKE 1 TABLET BY MOUTH TWICE A DAY 180 tablet 0   ibuprofen (ADVIL) 800 MG tablet Take 800 mg by mouth every 6 (six) hours as needed.     ondansetron  (ZOFRAN -ODT) 4 MG disintegrating tablet Take 1 tablet (4 mg total) by mouth every 8 (eight) hours as needed for nausea or vomiting. (Patient not taking: Reported on 01/06/2024) 20 tablet 0   No current facility-administered medications for this encounter.     REVIEW OF SYSTEMS: On review of systems, the patient reports that she is doing well but feels like her breasts are heavy and more tight. She is trying to see if she can see the [facility] at [facility] because her surgical bra fits too loosely now that she's had reduction surgery and needs compression to alleviate postoperative engorgement.     PHYSICAL EXAM:  Wt Readings from Last 3 Encounters:  01/06/24 231 lb 4 oz (104.9 kg)  12/13/23 230 lb 2.6 oz (104.4 kg)  12/03/23 230 lb 3.2 oz (104.4 kg)   Temp Readings from Last 3 Encounters:  01/06/24 (!) 97.5 F (36.4 C) (Temporal)  12/13/23 97.8 F (36.6 C) (Temporal)  11/17/23 (!) 97.1 F (36.2 C)   BP Readings from Last 3 Encounters:  01/06/24 (!) 166/89  01/05/24 133/85   12/21/23 138/85   Pulse Readings from Last 3 Encounters:  01/06/24 70  01/05/24 65  12/21/23 66    In general this is a well appearing African-American female in no acute distress. She's alert and oriented x4 and appropriate throughout the examination. Cardiopulmonary assessment is negative for acute distress and she exhibits normal effort.  Bilateral breast exam reveals bilateral keyhole incision sites that are well-healed without separation, erythema or drainage. Her breasts however are full and the skin is edematous especially in the lower inner quadrants. Her left axillary incision site is also well healed.    ECOG = 1  0 - Asymptomatic (Fully active, able to carry on all predisease activities without restriction)  1 - Symptomatic but completely ambulatory (Restricted in physically strenuous activity but ambulatory and able to carry out work of a light or sedentary nature. For example, light housework, office work)  2 - Symptomatic, <50% in bed during the day (Ambulatory and capable of all self care but unable to carry out any work activities. Up and about more than 50% of waking hours)  3 - Symptomatic, >50% in bed, but not bedbound (Capable of only limited self-care, confined to bed or chair 50% or more of waking hours)  4 - Bedbound (Completely disabled. Cannot carry on any self-care. Totally confined to bed or chair)  5 - Death   Aurea Blossom MM, Creech RH, Tormey DC, et al. [phone]). "Toxicity and response criteria of the [facility]". Am. Hillard Lowes. Oncol. 5 (6): 649-55    LABORATORY DATA:  Lab Results  Component Value Date   WBC 4.4 10/20/2023   HGB 13.3 10/20/2023   HCT 41.0 10/20/2023   MCV 87.4 10/20/2023   PLT 217 10/20/2023   Lab Results  Component Value Date   NA 137 10/20/2023   K 4.1 10/20/2023   CL 108 10/20/2023   CO2 25 10/20/2023   Lab Results  Component Value Date   ALT 6 10/20/2023   AST 15 10/20/2023   ALKPHOS 53 10/20/2023    BILITOT 0.9 10/20/2023      RADIOGRAPHY: No  results found.      IMPRESSION/PLAN: 1. At least Stage IA, [id number]-[id number], grade 2, ER/PR positive, invasive ductal carcinoma of the left breast. Dr. Jeryl Moris discusses the pathology findings and reviews the nature of early stage breast disease. The consensus from the breast conference includes MRI for extent of disease. She is likely a candidate for breast conservation with lumpectomy with sentinel node biopsy. She may also be a candidate for mastectomy if she chooses with sentinel node biopsy. Dr. Maryalice Smaller is also planning Oncotype Dx score to determine a role for systemic therapy. Provided that chemotherapy is not indicated, the patient's course would then be followed by external radiotherapy to the breast  to reduce risks of local recurrence. Dr. Maryalice Smaller anticipates adjuvant antiestrogen therapy to follow. We discussed the risks, benefits, short, and long term effects of radiotherapy, as well as the curative intent, and the patient is interested in proceeding. Dr. Jeryl Moris discusses the delivery and logistics of radiotherapy and anticipates a course of 4 or up to 6 1/2 weeks of radiotherapy to the left breast with deep inspiration breath hold technique if she undergoes breast conserving surgery. We will see her back a few weeks after surgery to discuss the simulation process and anticipate we starting radiotherapy about 4-6 weeks after surgery.   2. Possible genetic predisposition to malignancy. The patient is a candidate for genetic testing given her personal and family history. She will meet with our geneticist today in clinic.   In a visit lasting 60 minutes, greater than 50% of the time was spent face to face reviewing her case, as well as in preparation of, discussing, and coordinating the patient's care.  The above documentation reflects my direct findings during this shared patient visit. Please see the separate note by Dr. Jeryl Moris on this date for the remainder of  the patient's plan of care.    Shelvia Dick, [facility]    **Disclaimer: This note was dictated with voice recognition software. Similar sounding words can inadvertently be transcribed and this note may contain transcription errors which may not have been corrected upon publication of note.**

## 2024-01-07 ENCOUNTER — Ambulatory Visit: Payer: Self-pay | Attending: Hematology | Admitting: Rehabilitation

## 2024-01-07 ENCOUNTER — Encounter: Payer: Self-pay | Admitting: Radiation Oncology

## 2024-01-07 ENCOUNTER — Encounter: Payer: Self-pay | Admitting: Rehabilitation

## 2024-01-07 DIAGNOSIS — Z170 Estrogen receptor positive status [ER+]: Secondary | ICD-10-CM

## 2024-01-07 DIAGNOSIS — C50412 Malignant neoplasm of upper-outer quadrant of left female breast: Secondary | ICD-10-CM | POA: Insufficient documentation

## 2024-01-07 DIAGNOSIS — Z9189 Other specified personal risk factors, not elsewhere classified: Secondary | ICD-10-CM

## 2024-01-07 DIAGNOSIS — Z483 Aftercare following surgery for neoplasm: Secondary | ICD-10-CM | POA: Diagnosis present

## 2024-01-07 DIAGNOSIS — R293 Abnormal posture: Secondary | ICD-10-CM | POA: Diagnosis present

## 2024-01-07 NOTE — Progress Notes (Signed)
 Called pt to introduce myself as her Dance movement psychotherapist and to discuss the [employer]. Pt would like to apply so she will email her proof of income and proof of SNAP benefits.  I emailed her an expense sheet and she has my contact info for any questions or concerns she may have in the future.

## 2024-01-10 ENCOUNTER — Encounter: Payer: Self-pay | Admitting: Radiation Oncology

## 2024-01-10 NOTE — Progress Notes (Signed)
 Pt is approved for the $1000 [employer].

## 2024-01-11 ENCOUNTER — Encounter: Payer: Self-pay | Admitting: Rehabilitation

## 2024-01-11 ENCOUNTER — Other Ambulatory Visit: Payer: Self-pay | Admitting: Surgical

## 2024-01-11 ENCOUNTER — Telehealth: Payer: Self-pay | Admitting: Radiation Oncology

## 2024-01-11 ENCOUNTER — Ambulatory Visit: Admitting: Rehabilitation

## 2024-01-11 DIAGNOSIS — Z9189 Other specified personal risk factors, not elsewhere classified: Secondary | ICD-10-CM

## 2024-01-11 DIAGNOSIS — Z170 Estrogen receptor positive status [ER+]: Secondary | ICD-10-CM

## 2024-01-11 DIAGNOSIS — R293 Abnormal posture: Secondary | ICD-10-CM

## 2024-01-11 DIAGNOSIS — Z483 Aftercare following surgery for neoplasm: Secondary | ICD-10-CM

## 2024-01-11 DIAGNOSIS — C50412 Malignant neoplasm of upper-outer quadrant of left female breast: Secondary | ICD-10-CM | POA: Diagnosis not present

## 2024-01-11 MED ORDER — SULFAMETHOXAZOLE-TRIMETHOPRIM 800-160 MG PO TABS: 1.0000 | ORAL_TABLET | Freq: Two times a day (BID) | ORAL | 0 refills | Status: AC

## 2024-01-11 NOTE — Therapy (Signed)
 OUTPATIENT PHYSICAL THERAPY BREAST CANCER POST OP FOLLOW UP   Patient Name: Jody [name] MRN: [id number] DOB:October 03, 1970, 53 y.o., female Today's Date: 01/11/2024  END OF SESSION:  PT End of Session - 01/11/24 1204     Visit Number 4    Number of Visits 9    Date for PT Re-Evaluation 01/28/24    Authorization Type [employer] authorized 4 visits 01/07/24-02/06/24    Authorization - Visit Number 2    Authorization - Number of Visits 4    PT Start Time 1201    PT Stop Time 1242    PT Time Calculation (min) 41 min    Activity Tolerance Patient tolerated treatment well    Behavior During Therapy WFL for tasks assessed/performed               Past Medical History:  Diagnosis Date   Anemia    Anxiety    Asthma    Breast cancer (HCC)    Depression    GERD (gastroesophageal reflux disease)    History of anal fissures    History of gallstones    Hypertension    Migraine headache with aura    Migraines    without aura   Obesity    Past Surgical History:  Procedure Laterality Date   BREAST BIOPSY Left 10/08/2023   MM LT BREAST BX W LOC DEV 1ST LESION IMAGE BX SPEC STEREO GUIDE 10/08/2023 [facility]   BREAST BIOPSY Left 10/08/2023   MM LT BREAST BX W LOC DEV EA AD LESION IMG BX SPEC STEREO GUIDE 10/08/2023 [facility]   BREAST BIOPSY  11/16/2023   MM LT RADIOACTIVE SEED LOC MAMMO GUIDE 11/16/2023 [facility]   BREAST BIOPSY  11/16/2023   MM LT RADIOACTIVE SEED EA ADD LESION LOC MAMMO GUIDE 11/16/2023 [facility]   BREAST LUMPECTOMY Left 12/13/2023   Procedure: BREAST LUMPECTOMY;  Surgeon: Oza Blumenthal, MD;  Location: [facility];  Service: General;  Laterality: Left;  RE-EXCISION LEFT BREAST CANCER   BREAST LUMPECTOMY WITH RADIOACTIVE SEED AND SENTINEL LYMPH NODE BIOPSY Left 11/17/2023   Procedure: LEFT BREAST SEED GUIDED BRACKETED LUMPECTOMY AND SENTINEL LYMPH NODE BIOPSY;  Surgeon: Oza Blumenthal, MD;  Location: [facility];  Service: General;  Laterality: Left;   BREAST REDUCTION SURGERY Bilateral 12/13/2023   Procedure: MAMMOPLASTY, REDUCTION;  Surgeon: Thornell Flirt, DO;  Location:  SURGERY CENTER;  Service: Plastics;  Laterality: Bilateral;  ONCOPLASTIC BILATERAL BREAST REDUCTION WITH LIPSUCTION   CHOLECYSTECTOMY     ESOPHAGOGASTRODUODENOSCOPY     FLEXIBLE SIGMOIDOSCOPY  03/09/2012   Procedure: FLEXIBLE SIGMOIDOSCOPY;  Surgeon: Claudette Cue, MD;  Location: [facility];  Service: Endoscopy;  Laterality: N/A;   gastric sleeve surgery  03/14/2021   TUBAL LIGATION     Essure   Patient Active Problem List   Diagnosis Date Noted   Well woman exam with routine gynecological exam 11/12/2023   Perimenopause 11/12/2023   Anxiety 11/12/2023   Genetic testing 11/02/2023   Malignant neoplasm of upper-outer quadrant of left breast in female, estrogen receptor positive (HCC) 10/18/2023   Bronchitis 12/21/2022   Colon cancer screening 12/21/2022   Allergies 12/21/2022   Need for hepatitis C screening test 12/21/2022   Abnormal vaginal bleeding 12/30/2020   Endometriosis 08/21/2019   Low back pain with radiation 04/28/2019   Stress reaction 05/16/2017   Essential hypertension 06/18/2015   Hyperglycemia 06/18/2015   Hemorrhoids 01/31/2012   Fatigue 12/31/2011   Dyspepsia 12/31/2011  B12 deficiency 01/13/2011   Anxiety and depression 12/26/2010   Migraines 12/15/2010   Stress 12/15/2010   OBESITY, MORBID 04/04/2007    PCP: Roel Clarity, DO  REFERRING PROVIDER: Sonja [place], MD  REFERRING DIAG: Left breast cancer   THERAPY DIAG:  Malignant neoplasm of upper-outer quadrant of left breast in female, estrogen receptor positive (HCC)  Abnormal posture  Aftercare following surgery for neoplasm  At risk for lymphedema  Rationale for Evaluation and Treatment: Rehabilitation  ONSET DATE: 10/20/23  SUBJECTIVE:                                                                                                                                                                                            SUBJECTIVE STATEMENT: I go tomorrow to see about radiation.    Re-EVAL: It is sore and still swollen.  I went yesterday to see about my radiation but they said I was still swollen.  They both feel swollen.  I have my compression on but I'm not sure if its too big now.    PERTINENT HISTORY:  Patient was diagnosed on 10/20/23 with left grade IDC with DCIS measures 5.1 cm and is located in the upper outer quadrant. It is ER/PR positive, HER2 neg with a Ki67 of 5%. Lt breast lumpectomy with SLNB on 11/17/23 with 5 negative nodes removed.  re-excision and  a reduction with Dr. Orin Birk  12/13/23.  Then will have radiation.    PATIENT GOALS:  Reassess how my recovery is going related to arm function, pain, and swelling.  PAIN:  Are you having pain? Yes: NPRS scale: 4 Pain location: both breasts  Pain description: like a cold chill that comes and goes  Aggravating factors: Nothing really Relieving factors: medication   PRECAUTIONS: Recent Surgery, left UE Lymphedema risk  RED FLAGS: None   ACTIVITY LEVEL / LEISURE: I'm walking my dog now or walking outside.  I am back to doing my chores.     OBJECTIVE:  PATIENT SURVEYS:  QUICK DASH: 55% from 59% baseline   OBSERVATIONS: Still wearing compression but it is from before the surgery and has a lot of extra fabric which can allow for more edema, healed incisions with 1 stitch popping out under the left breast and steristrips still present across both vertical breast incisions. No cording  POSTURE:  Rounded shoulders   LYMPHEDEMA ASSESSMENT:   UPPER EXTREMITY AROM/PROM:   A/PROM RIGHT   eval   01/07/24 All motions pulling in breast mildly  Shoulder extension 45 50  Shoulder flexion 140 140  Shoulder abduction 155 155  Shoulder internal rotation 50 50  Shoulder external rotation 80 75                          (Blank  rows = not tested)   A/PROM LEFT   Eval - the left arm had pain with all the movements in the shoulder 4/10 12/06/23 left arm had pain with all the movements in the shoulder 4/10 01/07/24  Shoulder extension 45 45 45  Shoulder flexion 145 150 110 - pulling  / 160  Shoulder abduction 150 150 77 / 160  Shoulder internal rotation 60 45- "feels heavy"   Shoulder external rotation 70 50 - "feels heavy" 15 - pain  / 80                          (Blank rows = not tested)   CERVICAL AROM: All within normal limits:    LYMPHEDEMA ASSESSMENTS (in cm):    LANDMARK RIGHT   eval  10 cm proximal to olecranon process 39.8  Olecranon process 28.2  10 cm proximal to ulnar styloid process 22.5  Just proximal to ulnar styloid process 17.2  Across hand at thumb web space 19.8  At base of 2nd digit 6.5  (Blank rows = not tested)   LANDMARK LEFT   eval 12/06/23  10 cm proximal to olecranon process 40 40  Olecranon process 29 29  10  cm proximal to ulnar styloid process 23 23.4  Just proximal to ulnar styloid process 17 17  Across hand at thumb web space 20.2 20.2  At base of 2nd digit 6.5 6.5  (Blank rows = not tested)  TODAY"S TREATMENT 01/11/24 Started MLD: short neck, superficial and deep abdominals, bil axillary nodes and left inguinal nodes and then before starting breast decided to take a closer look at the lateral breast that seemed a bit red.  Checked in supine with some increased redness noted and some heat.  Had Sharon December, PT check and she also agreed that it may be infected.  Sent photo and note to matt scheeler, PA, and [employer], PA about infection as she was due to have her radiation sim tomorrow.   Note back that pt will move sim out x 1 week and she will see [facility] tomorrow.   PROM of the shoulder into flexion, abduction, and ER   01/07/24 Re-eval performed Education on new compression, gave new prescription and tried to call over to [facility] but they were not  answering so I encouraged her to try and stop by on the way home as she lives out of [place].   Reviewed and updated HEP per below  Tried both wall walking and dowel exercises and walll was better for her as she had too much scapular elevation with a dowel.   Discussed POC  PATIENT EDUCATION:  Education details: per today's note Person educated: Patient Education method: Explanation, Demonstration, Verbal cues, and Handouts Education comprehension: verbalized understanding  HOME EXERCISE PROGRAM: Reviewed previously given post op HEP. Access Code: [email or web address] URL: [email or web address] Date: 01/07/2024 Prepared by: Judy Null  Program Notes Here are the suggested stretches for after breast reduction surgery. You can start these after your drain(s) has been removed. Remember to avoid significant pain. Start walking immediately after surgery.   Exercises - Correct Seated Posture  - Seated Diaphragmatic Breathing  - 3 x daily - 7 x weekly - 10 reps - Shoulder External Rotation and Scapular Retraction  -  3 x daily - 7 x weekly - 5 reps - 3 seconds hold - Seated Chest Stretch with Hands Behind Head  - 3 x daily - 7 x weekly - 3 reps - 5 second hold - Standing Shoulder Flexion Wall Walk  - 3 x daily - 7 x weekly - 3 reps - 3 seconds hold - Standing Shoulder Abduction Finger Walk at Wall  - 3 x daily - 7 x weekly - 3 reps - 3 seconds hold - Supine Shoulder Flexion AAROM with Hands Clasped  - 1 x daily - 7 x weekly - 1-3 sets - 10 reps - 3 seconds hold  ASSESSMENT:  CLINICAL IMPRESSION: Treatment delayed due to possible infection.  Completed MLD prior to breast steps and PROM of the shoulder only.   Pt will benefit from skilled therapeutic intervention to improve on t/he following deficits: Decreased knowledge of precautions, impaired UE functional use, pain, decreased ROM, postural dysfunction.  PT treatment/interventions: ADL/Self care home management, 97110-Therapeutic  exercises, 97535- Self Care, and 04540- Manual therapy   GOALS: Goals reviewed with patient? Yes  LONG TERM GOALS:  (STG=LTG)  GOALS Name Target Date  Goal status  1 Pt will demonstrate she has regained full shoulder ROM and function post operatively compared to baselines.  Baseline: 01/28/24 ONGOING  2 Pt will obtain better compression for the left breast 01/28/24 NEW  3 Pt will be in with MLD for the Left breast if indicated 01/28/24 NEW  4        PLAN:  PT FREQUENCY/DURATION: 1-2x per week x 3 weeks   PLAN FOR NEXT SESSION: lt breast MLD if okay with Plastic sugery, AAROM/pulleys, PROM, get new bra? *MD says light massage is fine 01/10/24 - kT   [facility]  [address], [address]  [place] [address]  [phone]  After Breast Cancer Class Video It is recommended you view the ABC class video to be educated on lymphedema risk reduction. This video lasts for about 30 minutes. It can be viewed on our website here: [email or web address]  Scar massage You can begin gentle scar massage to you incision sites. Gently place one hand on the incision and move the skin (without sliding on the skin) in various directions. Do this for a few minutes and then you can gently massage either coconut oil or vitamin E cream into the scars.  Compression garment You should continue wearing your compression bra until you feel like you no longer have swelling.  Home exercise Program Continue doing the exercises you were given until you feel like you can do them without feeling any tightness at the end.   Walking Program Studies show that 30 minutes of walking per day (fast enough to elevate your heart rate) can significantly reduce the risk of a cancer recurrence. If you can't walk due to other medical reasons, we encourage you to find another activity you could do (like a stationary bike or water  exercise).  Posture After breast cancer surgery, people frequently sit with rounded shoulders posture because it puts their incisions on slack and feels better. If you sit like this and scar tissue forms in that position, you can become very tight and have pain sitting or standing with good posture. Try to be aware of your posture and sit and stand up tall to heal properly.  Follow up PT: It is recommended you return every 3 months for the first 3 years following surgery to be assessed on the  SOZO machine for an L-Dex score. This helps prevent clinically significant lymphedema in 95% of patients. These follow up screens are 10 minute appointments that you are not billed for.  Encarnacion Harris, PT 01/11/2024, 3:27 PM

## 2024-01-11 NOTE — Telephone Encounter (Signed)
 I saw the pt's images taken by PT and it appears the patient continues to have engorgement of her breast.  I called her and let her know that based on the imaging I feel that we should hold off on simulation for another week to allow her breast to become less edematous.  She will continue working with physical therapy and wear compression bra as well as the inserts that have been given to her.  She is also trying to get better fitting bras in order to accomplish this goal as well.  She was given an appointment 01/19/2024 for simulation.

## 2024-01-11 NOTE — Progress Notes (Signed)
 Reviewed photos taken by PT today, appreciate them letting us  know.  She appears to have some erythema and possible cellulitic changes, after speaking with patient she does report some discomfort in this area and firmness.  Discussed with patient I would like her to start Bactrim this afternoon, sent to her pharmacy.  She is not having any infectious symptoms, denies any fevers or chills.  She reports she feels well other than some discomfort to the left breast.  I would like her to follow-up in the office tomorrow for close observation.  She is encouraged to call with symptomatic changes or concerns.  All of her questions were answered to her content.

## 2024-01-12 ENCOUNTER — Ambulatory Visit (INDEPENDENT_AMBULATORY_CARE_PROVIDER_SITE_OTHER): Admitting: Surgical

## 2024-01-12 ENCOUNTER — Encounter: Payer: Self-pay | Admitting: Surgical

## 2024-01-12 ENCOUNTER — Ambulatory Visit: Admitting: Rehabilitation

## 2024-01-12 ENCOUNTER — Ambulatory Visit: Admitting: Radiation Oncology

## 2024-01-12 VITALS — BP 141/85 | HR 89

## 2024-01-12 DIAGNOSIS — C50412 Malignant neoplasm of upper-outer quadrant of left female breast: Secondary | ICD-10-CM | POA: Diagnosis present

## 2024-01-12 DIAGNOSIS — Z170 Estrogen receptor positive status [ER+]: Secondary | ICD-10-CM | POA: Diagnosis not present

## 2024-01-12 DIAGNOSIS — Z1721 Progesterone receptor positive status: Secondary | ICD-10-CM | POA: Diagnosis not present

## 2024-01-12 DIAGNOSIS — Z1732 Human epidermal growth factor receptor 2 negative status: Secondary | ICD-10-CM | POA: Diagnosis not present

## 2024-01-12 DIAGNOSIS — N61 Mastitis without abscess: Secondary | ICD-10-CM

## 2024-01-12 DIAGNOSIS — N6489 Other specified disorders of breast: Secondary | ICD-10-CM

## 2024-01-12 NOTE — Progress Notes (Signed)
 Patient is a 53 year old female here for follow-up on her bilateral oncoplastic breast reduction after left breast lumpectomy with Dr. Orin Birk and Dr. Lucienne Ryder on 12/13/23.  She did undergo a left breast lumpectomy and left breast sentinel lymph node biopsy on 11/17/2023 with Dr. Lucienne Ryder, however had to return due to positive margins in the left breast.  Positive margin was involving the medial margin of DCIS.  Received notification yesterday from physical therapy about increased redness of left breast and swelling.  Reached out to patient, asked her to come today for evaluation.  Patient reports today that she is overall feeling well, does report some discomfort and swelling to the left breast, but otherwise is not having any infectious symptoms she reports some possible chills occasionally.  But no other infectious symptoms.  She otherwise feels well and has been active   Chaperone present on exam BP (!) 141/85 (BP Location: Right Arm, Patient Position: Sitting, Cuff Size: Large)   Pulse 89   LMP 11/21/2023   SpO2 100%  Patient is well-developed, well-nourished, no acute distress.  Breathing is unlabored.  She does not appear ill. On exam left breast erythema is present, induration and cellulitic changes are noted along the lateral breast along partial mastectomy incision.  I do not appreciate any specific areas of fluctuance.  There is no crepitus.  There is some tenderness of the left breast with palpation.   Bilateral NAC's are viable, breast incisions are overall intact.  No redness noted the right breast.  A/P:  65 cc of serosanguineous fluid was aspirated from the left breast, some of this did appear cloudy and some questionable purulent drainage was aspirated.  This will be sent for culture.  Will send 2 cultures.  Initial fluid collected did appear mostly serous in nature, similar to lymph fluid.   Patient is currently on Bactrim, prescribed yesterday.  Will continue this for total of  1 week.  Discussed with patient findings, discussed with patient will send for culture.  Recommend continue to monitor her left breast, did mark out the area of redness with surgical marker, notified patient if it begins to spread or she notices worsening symptoms to please notify our office and I would like to see her later this week.  Otherwise would like to see her on Monday for close follow-up.  Discussed patient's case with Dr. Orin Birk.  Pictures were obtained of the patient and placed in the chart with the patient's or guardian's permission.

## 2024-01-12 NOTE — Addendum Note (Signed)
 Addended byPerrin Brakeman on: 01/12/2024 03:09 PM   Modules accepted: Orders

## 2024-01-13 ENCOUNTER — Encounter: Payer: Self-pay | Admitting: *Deleted

## 2024-01-13 DIAGNOSIS — Z170 Estrogen receptor positive status [ER+]: Secondary | ICD-10-CM

## 2024-01-17 ENCOUNTER — Ambulatory Visit (INDEPENDENT_AMBULATORY_CARE_PROVIDER_SITE_OTHER): Admitting: Surgical

## 2024-01-17 ENCOUNTER — Encounter: Payer: Self-pay | Admitting: Radiation Oncology

## 2024-01-17 ENCOUNTER — Encounter: Payer: Self-pay | Admitting: Surgical

## 2024-01-17 VITALS — BP 127/84 | HR 77

## 2024-01-17 DIAGNOSIS — N6489 Other specified disorders of breast: Secondary | ICD-10-CM

## 2024-01-17 DIAGNOSIS — C50412 Malignant neoplasm of upper-outer quadrant of left female breast: Secondary | ICD-10-CM

## 2024-01-17 DIAGNOSIS — N61 Mastitis without abscess: Secondary | ICD-10-CM

## 2024-01-17 DIAGNOSIS — Z170 Estrogen receptor positive status [ER+]: Secondary | ICD-10-CM

## 2024-01-17 MED ORDER — SULFAMETHOXAZOLE-TRIMETHOPRIM 800-160 MG PO TABS: 1.0000 | ORAL_TABLET | Freq: Two times a day (BID) | ORAL | 0 refills | Status: AC

## 2024-01-17 NOTE — Progress Notes (Signed)
 Patient is a 53 year old female who underwent left breast lumpectomy and left breast sentinel lymph node biopsy on 11/17/2023 with Dr. Lucienne Ryder.  She subsequently underwent additional left breast lumpectomy with Dr. Lucienne Ryder on 12/13/2023 for positive margins, had immediate bilateral oncoplastic breast reduction with Dr. Orin Birk.  She was last seen in the office on 01/12/2024, had erythema and cellulitic changes to left breast.  Left breast was aspirated, purulent drainage was expressed.  Cultures were positive for staph aureus.  Pansensitive.  Patient was prescribed prophylactic Bactrim  on 01/11/2024 prior to evaluation due to erythema and cellulitic changes noted in photo taken by PT.  Today she reports she is feeling better.  She still notices firmness of the left breast and swelling but reports improvement.  She is not having any infectious symptoms today.  BP 127/84 (BP Location: Right Arm, Patient Position: Sitting, Cuff Size: Large)   Pulse 77   SpO2 100%  Chaperone present on exam On exam patient is well-developed, well-nourished, no acute distress.  Breathing is unlabored.  There is no erythema or cellulitic changes or discomfort with palpation to right breast.  Right NAC is viable.  Right breast incisions intact.  Left breast is erythematous and cellulitic along the left lateral breast superiorly and anteriorly to lumpectomy incision.  She does have some mild tenderness with palpation.  I do not appreciate any obvious fluctuance or subcutaneous fluid collection with palpation.  Improvement is noted.  A/P:  53 year old female status post lumpectomy with oncoplastic breast reduction.  She is 5 weeks postop.  She has been on Bactrim  for about 5 to 6 days.  She is noticing improvement.  She does not have any systemic symptoms.  I would like her to continue Bactrim  for total of 2 weeks, would like her to follow-up in 4 days for reevaluation.  If there is minimal improvement at that time, we  will need to send for ultrasound and possible drain placement.  If she is improving, we will continue to closely monitor.  She is going to need radiation of the left breast, will notify radiation oncology team.  Pictures were obtained of the patient and placed in the chart with the patient's or guardian's permission.

## 2024-01-18 ENCOUNTER — Ambulatory Visit: Admitting: Rehabilitation

## 2024-01-18 LAB — AEROBIC/ANAEROBIC CULTURE W GRAM STAIN (SURGICAL/DEEP WOUND)

## 2024-01-19 ENCOUNTER — Ambulatory Visit: Admitting: Radiation Oncology

## 2024-01-20 ENCOUNTER — Encounter: Admitting: Rehabilitation

## 2024-01-20 ENCOUNTER — Ambulatory Visit

## 2024-01-21 ENCOUNTER — Ambulatory Visit (INDEPENDENT_AMBULATORY_CARE_PROVIDER_SITE_OTHER): Admitting: Surgical

## 2024-01-21 ENCOUNTER — Ambulatory Visit

## 2024-01-21 ENCOUNTER — Encounter: Payer: Self-pay | Admitting: Surgical

## 2024-01-21 VITALS — BP 122/82 | HR 82 | Ht 64.5 in | Wt 227.0 lb

## 2024-01-21 DIAGNOSIS — N61 Mastitis without abscess: Secondary | ICD-10-CM

## 2024-01-21 DIAGNOSIS — C50412 Malignant neoplasm of upper-outer quadrant of left female breast: Secondary | ICD-10-CM

## 2024-01-21 DIAGNOSIS — N6489 Other specified disorders of breast: Secondary | ICD-10-CM

## 2024-01-21 DIAGNOSIS — Z170 Estrogen receptor positive status [ER+]: Secondary | ICD-10-CM

## 2024-01-21 MED ORDER — CLINDAMYCIN HCL 150 MG PO CAPS: 450.0000 mg | ORAL_CAPSULE | Freq: Three times a day (TID) | ORAL | 0 refills | Status: AC

## 2024-01-21 NOTE — Progress Notes (Signed)
 Patient is a 53 year old female here for follow-up on her bilateral breast surgery.  She underwent a left breast lumpectomy and left breast sentinel lymph node biopsy on 11/17/2023, had positive margins and underwent additional left breast lumpectomy with Dr. Lucienne Ryder on 12/13/2023.  She then had immediate bilateral oncoplastic breast reduction with Dr. Orin Birk at that time.  She was last seen here in the office on 01/17/2024.  We have been managing her left breast cellulitic changes, erythema and abscess formation.  She did have fluid drained in the office on 01/12/2024.  She has been on Bactrim  for about 10 days now.  She personally feels that she has noticed improvement in the firmness and discomfort to her left breast.  She does not report any infectious symptoms today. She had cultures positive for staph aureus, pansensitive.  Today on exam she continues to have left breast cellulitic changes and firmness along the lumpectomy site.  There is mild tenderness with palpation.  There is no active drainage from the lumpectomy incision.    Bilateral breast incisions are intact, bilateral NAC's are viable.  There is no obvious subcutaneous fluid collections noted of either breast.  Of note patient is well-developed, well-nourished, no acute distress.  She does not appear ill.  Breathing is unlabored.   A/P:  Attempted to aspirate left breast, scant amount of cloudy serous sanguinous fluid was aspirated from the left breast.  Patient tolerated this well.  Sterile technique was used.  Will switch patient from Bactrim  to clindamycin.  Discussed with patient the importance of continuing/starting probiotics.  She had a probiotic water with her today, encouraged her to use probiotic products that may have less sugar.  Dr. Orin Birk was also able to evaluate patient.  We will send patient for ultrasound of left breast with possible drain placement for left breast abscess/infected fluid collection.  Would  like her to follow-up early next week for reevaluation, possible drainage in office If unable to obtain ultrasound by then.  Personally called centralized scheduling to see if patient can be booked for ultrasound of left breast with possible drain placement and aspiration. Will await return call, hopeful to book early next week

## 2024-01-24 ENCOUNTER — Ambulatory Visit

## 2024-01-24 ENCOUNTER — Ambulatory Visit: Attending: Hematology | Admitting: Rehabilitation

## 2024-01-25 ENCOUNTER — Inpatient Hospital Stay: Attending: Hematology | Admitting: Licensed Clinical Social Worker

## 2024-01-25 ENCOUNTER — Ambulatory Visit

## 2024-01-25 ENCOUNTER — Encounter: Payer: Self-pay | Admitting: Plastic Surgery

## 2024-01-25 ENCOUNTER — Ambulatory Visit (INDEPENDENT_AMBULATORY_CARE_PROVIDER_SITE_OTHER): Admitting: Plastic Surgery

## 2024-01-25 VITALS — BP 132/85 | HR 78 | Ht 64.5 in | Wt 230.6 lb

## 2024-01-25 DIAGNOSIS — C50412 Malignant neoplasm of upper-outer quadrant of left female breast: Secondary | ICD-10-CM

## 2024-01-25 DIAGNOSIS — Z9889 Other specified postprocedural states: Secondary | ICD-10-CM

## 2024-01-25 NOTE — Progress Notes (Signed)
 The patient is a 53 year old female here for follow-up on her left breast.  She had a reduction alongside partial mastectomy on the left.  It appears that the lumpectomy site got irritated and maybe even infected.  It is doing much better since she is taking the antibiotics.  She is feeling much better.  No sign of sepsis.  She does not have any chills fevers or nausea.  I encouraged her to keep her exam with radiology for Friday and we will plan to see her back next week.

## 2024-01-25 NOTE — Progress Notes (Signed)
 [facility] CSW Progress Note  Visual merchandiser received an Agricultural engineer from [employer] informing they are covering pt's [employer], [employer] and rent for a total of $1,255.93. Pt informed of the above by email as well.  CSW to remain available as appropriate to provide support throughout duration of treatment.      Quenton Bruns, LCSW Clinical Social Worker [facility]    Patient is participating in a Managed Medicaid Plan:  Yes

## 2024-01-26 ENCOUNTER — Ambulatory Visit

## 2024-01-26 ENCOUNTER — Ambulatory Visit: Admitting: Radiation Oncology

## 2024-01-26 NOTE — Progress Notes (Signed)
 Roxie Cord, MD sent to [facility] Approved for US  guided aspiration and possible drain placement.  HKM

## 2024-01-27 ENCOUNTER — Ambulatory Visit: Admitting: Rehabilitation

## 2024-01-27 ENCOUNTER — Ambulatory Visit

## 2024-01-27 ENCOUNTER — Ambulatory Visit: Admitting: Radiation Oncology

## 2024-01-27 NOTE — Progress Notes (Signed)
 Patient for IR aspiration & possible drain LT breast on Friday 01/28/24, I called and spoke with the patient on the phone and gave pre-procedure instructions. Pt was made aware to be here at 1p, NPO after MN prior to procedure as well as driver post procedure/recovery/discharge. Pt stated understanding.  Called 01/27/24

## 2024-01-28 ENCOUNTER — Other Ambulatory Visit: Payer: Self-pay

## 2024-01-28 ENCOUNTER — Ambulatory Visit
Admission: RE | Admit: 2024-01-28 | Discharge: 2024-01-28 | Disposition: A | Discharge status: Home / Self Care | Source: Ambulatory Visit | Attending: Surgical | Admitting: Surgical

## 2024-01-28 ENCOUNTER — Ambulatory Visit

## 2024-01-28 ENCOUNTER — Encounter: Payer: Self-pay | Admitting: Radiology

## 2024-01-28 VITALS — BP 103/81 | HR 67 | Temp 98.2°F | Resp 18

## 2024-01-28 DIAGNOSIS — N61 Mastitis without abscess: Secondary | ICD-10-CM

## 2024-01-28 DIAGNOSIS — Z5986 Financial insecurity: Secondary | ICD-10-CM | POA: Insufficient documentation

## 2024-01-28 DIAGNOSIS — Z5982 Transportation insecurity: Secondary | ICD-10-CM | POA: Diagnosis not present

## 2024-01-28 DIAGNOSIS — N6489 Other specified disorders of breast: Secondary | ICD-10-CM | POA: Insufficient documentation

## 2024-01-28 DIAGNOSIS — C50412 Malignant neoplasm of upper-outer quadrant of left female breast: Secondary | ICD-10-CM

## 2024-01-28 HISTORY — PX: IR IMAGE GUIDED FLUID DRAIN BY CATHETER: IMG5463

## 2024-01-28 MED ORDER — FENTANYL CITRATE (PF) 100 MCG/2ML IJ SOLN
INTRAMUSCULAR | Status: AC
  Filled 2024-01-28: qty 2

## 2024-01-28 MED ORDER — MIDAZOLAM HCL 2 MG/2ML IJ SOLN
INTRAMUSCULAR | Status: AC
  Filled 2024-01-28: qty 2

## 2024-01-28 MED ORDER — LIDOCAINE HCL 1 % IJ SOLN
4.0000 mL | Freq: Once | INTRAMUSCULAR | Status: AC
  Administered 2024-01-28: 4 mL via INTRADERMAL

## 2024-01-28 MED ORDER — MIDAZOLAM HCL 2 MG/2ML IJ SOLN
INTRAMUSCULAR | Status: AC | PRN
  Administered 2024-01-28: 1 mg via INTRAVENOUS

## 2024-01-28 MED ORDER — FENTANYL CITRATE (PF) 100 MCG/2ML IJ SOLN
INTRAMUSCULAR | Status: AC | PRN
  Administered 2024-01-28: 50 ug via INTRAVENOUS

## 2024-01-28 MED ORDER — SODIUM CHLORIDE 0.9 % IV SOLN: INTRAVENOUS | Status: DC

## 2024-01-28 MED ORDER — LIDOCAINE HCL 1 % IJ SOLN
INTRAMUSCULAR | Status: AC
  Filled 2024-01-28: qty 20

## 2024-01-28 NOTE — Progress Notes (Signed)
 Patient clinically stable post IR Breast aspiration with 7 FR drain placement per Dr Mabel Savage, tolerated well with vitals stable pre and post procedure. Received Versed  1 mg along with Fentanyl  50 mcg IV for procedure. Denies complaints post procedure. Report given to [facility] RN post procedure/specials/16

## 2024-01-28 NOTE — Procedures (Signed)
 Interventional Radiology Procedure Note  Procedure: Image guided drain placement, left breast fluid.  62F pigtail drain.  Complications: None  EBL: None Sample: Culture sent  Recommendations: - Routine drain care: record output if needed, no flush, recharge when needed -routine wound care - follow up Cx - DC 1 hr home - Surgical follow up next wednesday  Signed,  Dalessandro Baldyga S. Mabel Savage, DO

## 2024-01-28 NOTE — H&P (Signed)
 Chief Complaint: Patient was seen in consultation today for left breast fluid collection   Procedure: Aspiration with possible drain placement  Referring Physician(s): Scheeler,Matthew J  Supervising Physician: Myrlene Asper  Patient Status: [facility] - Out-pt  History of Present Illness: Jody [name] is a 53 y.o. female with a history of malignant neoplasm of the upper-outer quadrant of the left breast s/p lumpectomy with positive margins on 3/5 and subsequent lumpectomy with oncoplastic bilateral breast reduction on 3/31. At her initial follow up on 4/8, the patient was noted to have ~40cc serosanguinous fluid in her partial mastectomy site of the left breast. Fluid was drained and was not concerning for infection. On her follow up on 4/30 she was again noted to have some tenderness and swelling to her left breast. 65cc serosanguinous fluid was aspirated, some of which appeared cloudy and was sent for labs. Culture grew staph aureus. Patient was started on a course of Bactrim  which she took for around 10 days and later switched to clindamycin. At her follow up on 5/9, a scant amount of cloudy, serosanguinous fluid was aspirated and patient was reporting symptomatic improvement. She was referred to IR for possible drain placement of left breast abscess/fluid collection which was approved by Dr. Marne Sings.   Patient is resting in bed with her mom at the bedside. States that she feels like her symptoms have been improving since her last visit with plastic surgery and was surprised she was referred to us . She is still on the Clindamycin. She denies any left breast pain or recurrence of symptoms. Denies any fevers/chills, chest pain, shortness of breath, or abdominal pain. NPO since midnight. All questions and concerns answered at the bedside.  Code Status: Full Code per patient  Past Medical History:  Diagnosis Date   Anemia    Anxiety    Asthma    Breast cancer (HCC)    Depression     GERD (gastroesophageal reflux disease)    History of anal fissures    History of gallstones    Hypertension    Migraine headache with aura    Migraines    without aura   Obesity     Past Surgical History:  Procedure Laterality Date   BREAST BIOPSY Left 10/08/2023   MM LT BREAST BX W LOC DEV 1ST LESION IMAGE BX SPEC STEREO GUIDE 10/08/2023 [facility]   BREAST BIOPSY Left 10/08/2023   MM LT BREAST BX W LOC DEV EA AD LESION IMG BX SPEC STEREO GUIDE 10/08/2023 [facility]   BREAST BIOPSY  11/16/2023   MM LT RADIOACTIVE SEED LOC MAMMO GUIDE 11/16/2023 [facility]   BREAST BIOPSY  11/16/2023   MM LT RADIOACTIVE SEED EA ADD LESION LOC MAMMO GUIDE 11/16/2023 [facility]   BREAST LUMPECTOMY Left 12/13/2023   Procedure: BREAST LUMPECTOMY;  Surgeon: Oza Blumenthal, MD;  Location: [facility];  Service: General;  Laterality: Left;  RE-EXCISION LEFT BREAST CANCER   BREAST LUMPECTOMY WITH RADIOACTIVE SEED AND SENTINEL LYMPH NODE BIOPSY Left 11/17/2023   Procedure: LEFT BREAST SEED GUIDED BRACKETED LUMPECTOMY AND SENTINEL LYMPH NODE BIOPSY;  Surgeon: Oza Blumenthal, MD;  Location: [facility];  Service: General;  Laterality: Left;   BREAST REDUCTION SURGERY Bilateral 12/13/2023   Procedure: MAMMOPLASTY, REDUCTION;  Surgeon: Thornell Flirt, DO;  Location: [facility];  Service: Plastics;  Laterality: Bilateral;  ONCOPLASTIC BILATERAL BREAST REDUCTION WITH LIPSUCTION   CHOLECYSTECTOMY     ESOPHAGOGASTRODUODENOSCOPY     FLEXIBLE  SIGMOIDOSCOPY  03/09/2012   Procedure: FLEXIBLE SIGMOIDOSCOPY;  Surgeon: Claudette Cue, MD;  Location: [facility];  Service: Endoscopy;  Laterality: N/A;   gastric sleeve surgery  03/14/2021   TUBAL LIGATION     Essure    Allergies: Patient has no known allergies.  Medications: Prior to Admission medications   Medication Sig Start Date End Date Taking? Authorizing Provider  albuterol  (VENTOLIN   HFA) 108 (90 Base) MCG/ACT inhaler INHALE 2 PUFFS 4 TIMES A DAY AS NEEDED 10/12/23   Crecencio Dodge, Yvonne R, DO  clindamycin (CLEOCIN) 150 MG capsule Take 3 capsules (450 mg total) by mouth 3 (three) times daily for 7 days. 01/21/24 01/28/24  Scheeler, Janalyn Me, PA-C  famotidine  (PEPCID ) 20 MG tablet TAKE 1 TABLET BY MOUTH TWICE A DAY 01/03/24   Crecencio Dodge, Yvonne R, DO  fluticasone  (FLONASE ) 50 MCG/ACT nasal spray SPRAY 2 SPRAYS INTO EACH NOSTRIL EVERY DAY 01/03/24   Crecencio Dodge, Yvonne R, DO  hydrocortisone  (ANUSOL -HC) 25 MG suppository Place 1 suppository (25 mg total) rectally 2 (two) times daily as needed for hemorrhoids or anal itching. 12/22/22   Lowne Chase, Yvonne R, DO  ibuprofen (ADVIL) 800 MG tablet Take 800 mg by mouth every 6 (six) hours as needed. 10/19/23   [provider]  LORazepam  (ATIVAN ) 1 MG tablet Take 0.5 tablets (0.5 mg total) by mouth daily as needed for anxiety. 11/12/23   Romaine Closs, MD  metoprolol  tartrate (LOPRESSOR ) 50 MG tablet Take 1 tablet (50 mg total) by mouth 2 (two) times daily. 07/25/18   Lowne Chase, Yvonne R, DO  Multiple Vitamin (MULTI VITAMIN PO) Take by mouth.    [provider]  ondansetron  (ZOFRAN -ODT) 4 MG disintegrating tablet Take 1 tablet (4 mg total) by mouth every 8 (eight) hours as needed for nausea or vomiting. 12/03/23   Jhonnie Mosher, PA-C  traMADol  (ULTRAM ) 50 MG tablet Take 1 tablet (50 mg total) by mouth every 6 (six) hours as needed. 11/17/23   Oza Blumenthal, MD     Family History  Problem Relation Age of Onset   Breast cancer Mother 44       no genetic testing   Hypertension Mother    Hepatitis Father 41       Hep C   Diabetes Father    Arthritis Father    Hypertension Father    Kidney disease Father    Heart attack Father 29   Breast cancer Maternal Aunt        dx late 70s; mother's mat half sister   Cancer Maternal Uncle        x2 mat uncles; unk type; dx >50   Coronary artery disease Maternal Grandmother     Cataracts Maternal Grandmother    Glaucoma Maternal Grandmother    Heart disease Maternal Grandmother    Colon cancer Paternal Grandmother        dx >50   Obesity Paternal Grandmother    Cancer Cousin        unk type; dx 56s; mat female cousin   Obesity Other    Stomach cancer Neg Hx    Rectal cancer Neg Hx     Social History   Socioeconomic History   Marital status: Single    Spouse name: Not on file   Number of children: 3   Years of education: Not on file   Highest education level: Not on file  Occupational History   Occupation: disability    Employer: [employer]  Tobacco Use   Smoking status: Never   Smokeless tobacco: Never  Vaping Use   Vaping status: Never Used  Substance and Sexual Activity   Alcohol use: No   Drug use: No   Sexual activity: Not Currently    Partners: Male    Comment: [facility]  Other Topics Concern   Not on file  Social History Narrative   Exercise-- walking 30 min daily   Social Drivers of Health   Financial Resource Strain: High Risk (10/26/2023)   Overall Financial Resource Strain (CARDIA)    Difficulty of Paying Living Expenses: Very hard  Food Insecurity: No Food Insecurity (01/06/2024)   Hunger Vital Sign    Worried About Running Out of Food in the Last Year: Never true    Ran Out of Food in the Last Year: Never true  Transportation Needs: Unmet Transportation Needs (10/22/2023)   PRAPARE - Administrator, Civil Service (Medical): Yes    Lack of Transportation (Non-Medical): Yes  Physical Activity: Not on file  Stress: Not on file  Social Connections: Not on file    Review of Systems Denies any N/V, chest pain, shortness of breath, fevers/chills, or tenderness to lumpectomy site. All other ROS negative.  Vital Signs: BP 124/83   Pulse 72   Temp 98.2 F (36.8 C) (Oral)   Resp 18   LMP 01/21/2024   SpO2 99%    Physical Exam Vitals reviewed.  Constitutional:      Appearance: Normal appearance.   HENT:     Head: Normocephalic and atraumatic.     Mouth/Throat:     Mouth: Mucous membranes are moist.     Pharynx: Oropharynx is clear.  Cardiovascular:     Rate and Rhythm: Normal rate and regular rhythm.  Pulmonary:     Effort: Pulmonary effort is normal.     Breath sounds: Normal breath sounds.  Chest:     Comments: Incision scar noted to the upper outer quadrant of left breast. 2-3inch area of firmness noted to the lumpectomy site with minimal induration. No palpable fluctuance or visible drainage. No tenderness to palpation Abdominal:     General: Abdomen is flat.     Palpations: Abdomen is soft.     Tenderness: There is no abdominal tenderness.  Musculoskeletal:        General: Normal range of motion.     Cervical back: Normal range of motion.  Skin:    General: Skin is warm.  Neurological:     General: No focal deficit present.     Mental Status: She is alert and oriented to person, place, and time. Mental status is at baseline.  Psychiatric:        Mood and Affect: Mood normal.        Behavior: Behavior normal.        Judgment: Judgment normal.     Imaging: No results found.  Labs:  CBC: Recent Labs    10/20/23 1215  WBC 4.4  HGB 13.3  HCT 41.0  PLT 217    COAGS: No results for input(s): "INR", "APTT" in the last 8760 hours.  BMP: Recent Labs    10/20/23 1215  NA 137  K 4.1  CL 108  CO2 25  GLUCOSE 77  BUN 10  CALCIUM 8.7*  CREATININE 0.79  GFRNONAA >60    LIVER FUNCTION TESTS: Recent Labs    10/20/23 1215  BILITOT 0.9  AST 15  ALT 6  ALKPHOS  53  PROT 6.9  ALBUMIN 3.8    TUMOR MARKERS: No results for input(s): "AFPTM", "CEA", "CA199", "CHROMGRNA" in the last 8760 hours.  Assessment and Plan:  Recurrent left breast fluid collection s/p lumpectomy and bilateral oncoplastic breast reduction: Maudry Salynn [name] is a 53 y.o. female with a history of malignant neoplasm of left breast with recurrent fluid collection s/p  lumpectomy who presents to [facility] department for an image-guided aspiration with possible drain placement with Dr. Wilnette Haste. Procedure to be performed under moderate sedation.  -Discussed with patient the possibility of no drain placement depending on the size of the fluid collection. Patient and mother verbalized understanding  -Discussed basic drain care/after care with patient should a drain be placed  -Plan for fluid collection aspiration with possible drain placement  Risks and benefits discussed with the patient including bleeding, infection, damage to adjacent structures, bowel perforation/fistula connection, and sepsis.  All of the patient's questions were answered, patient is agreeable to proceed. Consent signed and in chart.   Thank you for this interesting consult. I greatly enjoyed meeting Jody [name] and look forward to participating in their care. A copy of this report was sent to the requesting provider on this date.  Electronically Signed: Orson Blalock, PA-C 01/28/2024, 1:38 PM   I spent a total of  30 Minutes in face to face clinical consultation, greater than 50% of which was counseling/coordinating care for fluid collection aspiration with possible drain placement.

## 2024-01-31 ENCOUNTER — Ambulatory Visit

## 2024-01-31 ENCOUNTER — Other Ambulatory Visit: Payer: Self-pay | Admitting: Family Medicine

## 2024-01-31 DIAGNOSIS — K644 Residual hemorrhoidal skin tags: Secondary | ICD-10-CM

## 2024-02-01 ENCOUNTER — Ambulatory Visit

## 2024-02-02 ENCOUNTER — Ambulatory Visit

## 2024-02-02 ENCOUNTER — Ambulatory Visit (INDEPENDENT_AMBULATORY_CARE_PROVIDER_SITE_OTHER): Admitting: Surgical

## 2024-02-02 VITALS — BP 129/86 | HR 68

## 2024-02-02 DIAGNOSIS — C50412 Malignant neoplasm of upper-outer quadrant of left female breast: Secondary | ICD-10-CM

## 2024-02-02 DIAGNOSIS — N61 Mastitis without abscess: Secondary | ICD-10-CM

## 2024-02-02 DIAGNOSIS — Z170 Estrogen receptor positive status [ER+]: Secondary | ICD-10-CM

## 2024-02-02 NOTE — Progress Notes (Signed)
 Patient is a 53 year old female here for follow-up after oncoplastic breast reduction in conjunction with left breast lumpectomy on 12/13/2023.  Prior to this she did undergo left breast lumpectomy on 11/17/2023, but had positive margins and return to the OR on 12/13/2023.  Postoperatively she developed a left breast abscess with erythema and cellulitic changes of the left lateral breast over the lumpectomy site.  She was initially on Bactrim , but switched to clindamycin  given cultures positive for staph.  She has significantly improved on clindamycin , but also had a drain placed with IR on 01/30/2024.  She reports she overall tolerated this well.  Cultures from IR also positive for staph, pansensitive.  She reports that since the drain was placed on 01/28/2024, she has had no output from the drain.  She reports that she has noticed significant improvement in the left breast.  She is not having any infectious symptoms.  Chaperone present on exam On exam bilateral NAC's are viable, bilateral breast incisions are intact and well-healed.  She does have a drain in the left breast just over the lumpectomy site, no erythema or cellulitic changes are noted.  Firmness significantly improved.  There is no drainage in the bulb.   A/P:  Ms. Arechiga is overall doing much better, she has had no output from the drain.  However, we will plan to leave drain in place for 2 more days and remove on Friday.  Discussed case with Dr. Orin Birk who is in agreement with plan.  After removal of drain, will continue to follow.  She is no longer on clindamycin  and is doing well.  We will hold off on any further antibiotics at this time, may place patient back on antibiotics after drain removal for a few additional days -will reevaluate Friday.

## 2024-02-03 ENCOUNTER — Ambulatory Visit

## 2024-02-03 ENCOUNTER — Ambulatory Visit: Admitting: Radiation Oncology

## 2024-02-03 LAB — AEROBIC/ANAEROBIC CULTURE W GRAM STAIN (SURGICAL/DEEP WOUND)

## 2024-02-04 ENCOUNTER — Ambulatory Visit

## 2024-02-04 ENCOUNTER — Ambulatory Visit (INDEPENDENT_AMBULATORY_CARE_PROVIDER_SITE_OTHER): Admitting: Surgical

## 2024-02-04 ENCOUNTER — Ambulatory Visit: Admitting: Radiation Oncology

## 2024-02-04 DIAGNOSIS — C50412 Malignant neoplasm of upper-outer quadrant of left female breast: Secondary | ICD-10-CM

## 2024-02-04 DIAGNOSIS — N61 Mastitis without abscess: Secondary | ICD-10-CM

## 2024-02-04 DIAGNOSIS — Z170 Estrogen receptor positive status [ER+]: Secondary | ICD-10-CM

## 2024-02-04 MED ORDER — CLINDAMYCIN HCL 150 MG PO CAPS: 450.0000 mg | ORAL_CAPSULE | Freq: Three times a day (TID) | ORAL | 0 refills | Status: AC

## 2024-02-04 NOTE — Progress Notes (Signed)
 53 year old female here for follow-up on her bilateral oncoplastic breast reduction.  She is doing really well, she did have an infection in the left breast lumpectomy site with cultures positive for staph, pansensitive.  She has significantly improved on clindamycin  and had a drain placed by IR on 01/30/2024.  There has been scant drainage from the site with the IR placed drain.  She is here today for drain removal.  Chaperone present She is well-developed, well-nourished, no acute distress.  Breathing is unlabored. On exam bilateral NAC's are viable, breast incisions are intact and well-healed.  Drain in site over left breast over lumpectomy incision.  There is no erythema or cellulitic changes noted.  Firmness significantly improved.  No drainage in the bulb, scant amount of drainage in the pigtail drain.  A/P:  Left JP drain was removed, patient tolerated this well.  Recommend following up in 1 week for reevaluation.  She reports she has an appointment with Dr. Jeryl Moris with radiation oncology June 2.  We will plan to see her next week on May 30 for reevaluation.  I would like to place her on clindamycin  for a few more days given drain removal.  No purulence or concerning drainage noted after drain removal.  Patient was agreeable with this plan.  Pictures were obtained of the patient and placed in the chart with the patient's or guardian's permission.

## 2024-02-08 ENCOUNTER — Ambulatory Visit

## 2024-02-09 ENCOUNTER — Ambulatory Visit

## 2024-02-10 ENCOUNTER — Ambulatory Visit

## 2024-02-11 ENCOUNTER — Ambulatory Visit (INDEPENDENT_AMBULATORY_CARE_PROVIDER_SITE_OTHER): Admitting: Surgical

## 2024-02-11 ENCOUNTER — Ambulatory Visit

## 2024-02-11 VITALS — BP 132/87 | HR 79

## 2024-02-11 DIAGNOSIS — C50412 Malignant neoplasm of upper-outer quadrant of left female breast: Secondary | ICD-10-CM

## 2024-02-11 DIAGNOSIS — Z170 Estrogen receptor positive status [ER+]: Secondary | ICD-10-CM

## 2024-02-11 NOTE — Progress Notes (Signed)
 53 year old female here for follow-up after bilateral oncoplastic reduction.  She also underwent left breast lumpectomy x 2.  She did develop an infection of the left breast lumpectomy site, cultures positive for staph.  Pansensitive.  She did have an IR drain placed on 01/30/2024.  She significantly improved after drain placement and clindamycin .  She presents today and is doing really well, continues to notice softening of the left breast without any worsening symptoms.  She reports she is scheduled to see Dr. Jeryl Moris with radiation oncology next week on Monday.  She believes she is scheduled to start radiation the following week.  Chaperone present on exam On exam she is well-developed, well-nourished, no acute distress.  Breathing is unlabored.  On exam bilateral NAC's are viable, breast incisions are intact and well-healed.  Right breast is soft, nontender.  Left breast is soft as well, she does have some scarring and firmness present over her left breast lumpectomy site, but significantly improved.  No erythema or cellulitic changes noted of either breast.  Drainage wound has healed with some slight scabbing present  A/P:  Patient is doing really well, she has completed course of clindamycin .  There is no signs of ongoing infection present.  She is scheduled to see radiation oncology next week, based on physical exam today, okay to proceed with radiation.  We discussed following up after completing radiation for reevaluation.  We discussed that certainly if she has any issues prior, we can certainly see her sooner.  All of her questions were answered to her content, recommend calling with any further questions or concerns.

## 2024-02-14 ENCOUNTER — Ambulatory Visit

## 2024-02-14 ENCOUNTER — Ambulatory Visit
Admission: RE | Admit: 2024-02-14 | Discharge: 2024-02-14 | Disposition: A | Discharge status: Home / Self Care | Source: Ambulatory Visit | Attending: Radiation Oncology | Admitting: Radiation Oncology

## 2024-02-14 DIAGNOSIS — Z510 Encounter for antineoplastic radiation therapy: Secondary | ICD-10-CM | POA: Diagnosis present

## 2024-02-14 DIAGNOSIS — C50412 Malignant neoplasm of upper-outer quadrant of left female breast: Secondary | ICD-10-CM | POA: Insufficient documentation

## 2024-02-14 DIAGNOSIS — Z170 Estrogen receptor positive status [ER+]: Secondary | ICD-10-CM | POA: Insufficient documentation

## 2024-02-14 NOTE — Progress Notes (Signed)
 The patient was seen today in simulation and consented for treatment, her skin was inspected in the left breast area and her reduction incisions are well-healed and the previously noticed tightening and erythema has resolved.  We will proceed with simulation and subsequent radiotherapy beginning next week.

## 2024-02-15 ENCOUNTER — Ambulatory Visit

## 2024-02-16 ENCOUNTER — Ambulatory Visit

## 2024-02-17 ENCOUNTER — Ambulatory Visit

## 2024-02-18 ENCOUNTER — Ambulatory Visit

## 2024-02-18 ENCOUNTER — Ambulatory Visit: Admitting: Radiation Oncology

## 2024-02-18 DIAGNOSIS — Z510 Encounter for antineoplastic radiation therapy: Secondary | ICD-10-CM | POA: Diagnosis not present

## 2024-02-21 ENCOUNTER — Other Ambulatory Visit: Payer: Self-pay

## 2024-02-21 ENCOUNTER — Ambulatory Visit

## 2024-02-21 ENCOUNTER — Ambulatory Visit
Admission: RE | Admit: 2024-02-21 | Discharge: 2024-02-21 | Disposition: A | Discharge status: Home / Self Care | Source: Ambulatory Visit | Attending: Radiation Oncology | Admitting: Radiation Oncology

## 2024-02-21 DIAGNOSIS — Z510 Encounter for antineoplastic radiation therapy: Secondary | ICD-10-CM | POA: Diagnosis not present

## 2024-02-21 LAB — RAD ONC ARIA SESSION SUMMARY
Course Elapsed Days: 0
Plan Fractions Treated to Date: 1
Plan Prescribed Dose Per Fraction: 2.66 Gy
Plan Total Fractions Prescribed: 16
Plan Total Prescribed Dose: 42.56 Gy
Reference Point Dosage Given to Date: 2.66 Gy
Reference Point Session Dosage Given: 2.66 Gy
Session Number: 1

## 2024-02-22 ENCOUNTER — Other Ambulatory Visit: Payer: Self-pay

## 2024-02-22 ENCOUNTER — Ambulatory Visit

## 2024-02-22 ENCOUNTER — Ambulatory Visit
Admission: RE | Admit: 2024-02-22 | Discharge: 2024-02-22 | Discharge status: Home / Self Care | Source: Ambulatory Visit | Attending: Radiation Oncology

## 2024-02-22 DIAGNOSIS — Z510 Encounter for antineoplastic radiation therapy: Secondary | ICD-10-CM | POA: Diagnosis not present

## 2024-02-22 LAB — RAD ONC ARIA SESSION SUMMARY
Course Elapsed Days: 1
Plan Fractions Treated to Date: 2
Plan Prescribed Dose Per Fraction: 2.66 Gy
Plan Total Fractions Prescribed: 16
Plan Total Prescribed Dose: 42.56 Gy
Reference Point Dosage Given to Date: 5.32 Gy
Reference Point Session Dosage Given: 2.66 Gy
Session Number: 2

## 2024-02-23 ENCOUNTER — Ambulatory Visit

## 2024-02-23 ENCOUNTER — Ambulatory Visit
Admission: RE | Admit: 2024-02-23 | Discharge: 2024-02-23 | Disposition: A | Discharge status: Home / Self Care | Source: Ambulatory Visit | Attending: Radiation Oncology | Admitting: Radiation Oncology

## 2024-02-23 ENCOUNTER — Other Ambulatory Visit: Payer: Self-pay

## 2024-02-23 DIAGNOSIS — Z510 Encounter for antineoplastic radiation therapy: Secondary | ICD-10-CM | POA: Diagnosis not present

## 2024-02-23 LAB — RAD ONC ARIA SESSION SUMMARY
Course Elapsed Days: 2
Plan Fractions Treated to Date: 3
Plan Prescribed Dose Per Fraction: 2.66 Gy
Plan Total Fractions Prescribed: 16
Plan Total Prescribed Dose: 42.56 Gy
Reference Point Dosage Given to Date: 7.98 Gy
Reference Point Session Dosage Given: 2.66 Gy
Session Number: 3

## 2024-02-24 ENCOUNTER — Ambulatory Visit
Admission: RE | Admit: 2024-02-24 | Discharge: 2024-02-24 | Disposition: A | Discharge status: Home / Self Care | Source: Ambulatory Visit | Attending: Radiation Oncology | Admitting: Radiation Oncology

## 2024-02-24 ENCOUNTER — Ambulatory Visit

## 2024-02-24 ENCOUNTER — Other Ambulatory Visit: Payer: Self-pay

## 2024-02-24 DIAGNOSIS — Z510 Encounter for antineoplastic radiation therapy: Secondary | ICD-10-CM | POA: Diagnosis not present

## 2024-02-24 LAB — RAD ONC ARIA SESSION SUMMARY
Course Elapsed Days: 3
Plan Fractions Treated to Date: 4
Plan Prescribed Dose Per Fraction: 2.66 Gy
Plan Total Fractions Prescribed: 16
Plan Total Prescribed Dose: 42.56 Gy
Reference Point Dosage Given to Date: 10.64 Gy
Reference Point Session Dosage Given: 2.66 Gy
Session Number: 4

## 2024-02-25 ENCOUNTER — Ambulatory Visit
Admission: RE | Admit: 2024-02-25 | Discharge: 2024-02-25 | Disposition: A | Discharge status: Home / Self Care | Source: Ambulatory Visit | Attending: Radiation Oncology | Admitting: Radiation Oncology

## 2024-02-25 ENCOUNTER — Ambulatory Visit

## 2024-02-25 ENCOUNTER — Ambulatory Visit: Payer: Self-pay | Attending: Hematology | Admitting: Rehabilitation

## 2024-02-25 ENCOUNTER — Encounter: Payer: Self-pay | Admitting: Rehabilitation

## 2024-02-25 ENCOUNTER — Other Ambulatory Visit: Payer: Self-pay

## 2024-02-25 DIAGNOSIS — C50412 Malignant neoplasm of upper-outer quadrant of left female breast: Secondary | ICD-10-CM | POA: Insufficient documentation

## 2024-02-25 DIAGNOSIS — Z9189 Other specified personal risk factors, not elsewhere classified: Secondary | ICD-10-CM | POA: Insufficient documentation

## 2024-02-25 DIAGNOSIS — Z510 Encounter for antineoplastic radiation therapy: Secondary | ICD-10-CM | POA: Diagnosis not present

## 2024-02-25 DIAGNOSIS — R293 Abnormal posture: Secondary | ICD-10-CM | POA: Insufficient documentation

## 2024-02-25 DIAGNOSIS — Z170 Estrogen receptor positive status [ER+]: Secondary | ICD-10-CM | POA: Insufficient documentation

## 2024-02-25 DIAGNOSIS — Z483 Aftercare following surgery for neoplasm: Secondary | ICD-10-CM | POA: Insufficient documentation

## 2024-02-25 LAB — RAD ONC ARIA SESSION SUMMARY
Course Elapsed Days: 4
Plan Fractions Treated to Date: 5
Plan Prescribed Dose Per Fraction: 2.66 Gy
Plan Total Fractions Prescribed: 16
Plan Total Prescribed Dose: 42.56 Gy
Reference Point Dosage Given to Date: 13.3 Gy
Reference Point Session Dosage Given: 2.66 Gy
Session Number: 5

## 2024-02-25 MED ORDER — ALRA NON-METALLIC DEODORANT (RAD-ONC)
1.0000 | Freq: Once | TOPICAL | Status: AC
  Administered 2024-02-25: 1 via TOPICAL

## 2024-02-25 MED ORDER — RADIAPLEXRX EX GEL: Freq: Once | CUTANEOUS | Status: AC

## 2024-02-25 NOTE — Therapy (Signed)
 OUTPATIENT PHYSICAL THERAPY SOZO SCREENING NOTE   Patient Name: Jody [name] MRN: [id number] DOB:1971-06-12, 53 y.o., female Today's Date: 02/25/2024  PCP: Estill Hemming, DO REFERRING PROVIDER: Sonja Wedgefield, MD   PT End of Session - 02/25/24 1219     Visit Number 4   screen only   Number of Visits 9    PT Start Time 1055    PT Stop Time 1100    PT Time Calculation (min) 5 min    Activity Tolerance Patient tolerated treatment well    Behavior During Therapy WFL for tasks assessed/performed          Past Medical History:  Diagnosis Date   Anemia    Anxiety    Asthma    Breast cancer (HCC)    Depression    GERD (gastroesophageal reflux disease)    History of anal fissures    History of gallstones    Hypertension    Migraine headache with aura    Migraines    without aura   Obesity    Past Surgical History:  Procedure Laterality Date   BREAST BIOPSY Left 10/08/2023   MM LT BREAST BX W LOC DEV 1ST LESION IMAGE BX SPEC STEREO GUIDE 10/08/2023 [facility]   BREAST BIOPSY Left 10/08/2023   MM LT BREAST BX W LOC DEV EA AD LESION IMG BX SPEC STEREO GUIDE 10/08/2023 [facility]   BREAST BIOPSY  11/16/2023   MM LT RADIOACTIVE SEED LOC MAMMO GUIDE 11/16/2023 [facility]   BREAST BIOPSY  11/16/2023   MM LT RADIOACTIVE SEED EA ADD LESION LOC MAMMO GUIDE 11/16/2023 [facility]   BREAST LUMPECTOMY Left 12/13/2023   Procedure: BREAST LUMPECTOMY;  Surgeon: Oza Blumenthal, MD;  Location: [facility];  Service: General;  Laterality: Left;  RE-EXCISION LEFT BREAST CANCER   BREAST LUMPECTOMY WITH RADIOACTIVE SEED AND SENTINEL LYMPH NODE BIOPSY Left 11/17/2023   Procedure: LEFT BREAST SEED GUIDED BRACKETED LUMPECTOMY AND SENTINEL LYMPH NODE BIOPSY;  Surgeon: Oza Blumenthal, MD;  Location: [facility];  Service: General;  Laterality: Left;   BREAST REDUCTION SURGERY Bilateral 12/13/2023   Procedure: MAMMOPLASTY, REDUCTION;   Surgeon: Thornell Flirt, DO;  Location: [facility];  Service: Plastics;  Laterality: Bilateral;  ONCOPLASTIC BILATERAL BREAST REDUCTION WITH LIPSUCTION   CHOLECYSTECTOMY     ESOPHAGOGASTRODUODENOSCOPY     FLEXIBLE SIGMOIDOSCOPY  03/09/2012   Procedure: FLEXIBLE SIGMOIDOSCOPY;  Surgeon: Claudette Cue, MD;  Location: [facility];  Service: Endoscopy;  Laterality: N/A;   gastric sleeve surgery  03/14/2021   IR IMAGE GUIDED FLUID DRAIN BY CATHETER  01/28/2024   TUBAL LIGATION     Essure   Patient Active Problem List   Diagnosis Date Noted   Well woman exam with routine gynecological exam 11/12/2023   Perimenopause 11/12/2023   Anxiety 11/12/2023   Genetic testing 11/02/2023   Malignant neoplasm of upper-outer quadrant of left breast in female, estrogen receptor positive (HCC) 10/18/2023   Bronchitis 12/21/2022   Colon cancer screening 12/21/2022   Allergies 12/21/2022   Need for hepatitis C screening test 12/21/2022   Abnormal vaginal bleeding 12/30/2020   Endometriosis 08/21/2019   Low back pain with radiation 04/28/2019   Stress reaction 05/16/2017   Essential hypertension 06/18/2015   Hyperglycemia 06/18/2015   Hemorrhoids 01/31/2012   Fatigue 12/31/2011   Dyspepsia 12/31/2011   B12 deficiency 01/13/2011   Anxiety and depression 12/26/2010   Migraines 12/15/2010   Stress 12/15/2010  OBESITY, MORBID 04/04/2007    REFERRING DIAG: left breast cancer at risk for lymphedema  THERAPY DIAG:  Malignant neoplasm of upper-outer quadrant of left breast in female, estrogen receptor positive (HCC)  Abnormal posture  Aftercare following surgery for neoplasm  At risk for lymphedema  PERTINENT HISTORY: Patient was diagnosed on 10/20/23 with left grade IDC with DCIS measures 5.1 cm and is located in the upper outer quadrant. It is ER/PR positive, HER2 neg with a Ki67 of 5%. Lt breast lumpectomy with SLNB on 11/17/23 with 5 negative nodes removed. re-excision and a  reduction with Dr. Orin Birk 12/13/23. Then will have radiation.   PRECAUTIONS: left UE Lymphedema risk  SUBJECTIVE: just started radiation  PAIN:  Are you having pain? No  SOZO SCREENING: Patient was assessed today using the SOZO machine to determine the lymphedema index score. This was compared to her baseline score. It was determined that she is within the recommended range when compared to her baseline and no further action is needed at this time. She will continue SOZO screenings. These are done every 3 months for 2 years post operatively followed by every 6 months for 2 years, and then annually.    Encarnacion Harris, PT 02/25/2024, 12:20 PM

## 2024-02-28 ENCOUNTER — Ambulatory Visit

## 2024-02-28 ENCOUNTER — Ambulatory Visit
Admission: RE | Admit: 2024-02-28 | Discharge: 2024-02-28 | Disposition: A | Discharge status: Home / Self Care | Source: Ambulatory Visit | Attending: Radiation Oncology

## 2024-02-28 ENCOUNTER — Other Ambulatory Visit: Payer: Self-pay

## 2024-02-28 DIAGNOSIS — Z510 Encounter for antineoplastic radiation therapy: Secondary | ICD-10-CM | POA: Diagnosis not present

## 2024-02-28 LAB — RAD ONC ARIA SESSION SUMMARY
Course Elapsed Days: 7
Plan Fractions Treated to Date: 6
Plan Prescribed Dose Per Fraction: 2.66 Gy
Plan Total Fractions Prescribed: 16
Plan Total Prescribed Dose: 42.56 Gy
Reference Point Dosage Given to Date: 15.96 Gy
Reference Point Session Dosage Given: 2.66 Gy
Session Number: 6

## 2024-02-29 ENCOUNTER — Other Ambulatory Visit: Payer: Self-pay

## 2024-02-29 ENCOUNTER — Ambulatory Visit

## 2024-02-29 ENCOUNTER — Ambulatory Visit
Admission: RE | Admit: 2024-02-29 | Discharge: 2024-02-29 | Disposition: A | Discharge status: Home / Self Care | Source: Ambulatory Visit | Attending: Radiation Oncology | Admitting: Radiation Oncology

## 2024-02-29 DIAGNOSIS — Z510 Encounter for antineoplastic radiation therapy: Secondary | ICD-10-CM | POA: Diagnosis not present

## 2024-02-29 LAB — RAD ONC ARIA SESSION SUMMARY
Course Elapsed Days: 8
Plan Fractions Treated to Date: 7
Plan Prescribed Dose Per Fraction: 2.66 Gy
Plan Total Fractions Prescribed: 16
Plan Total Prescribed Dose: 42.56 Gy
Reference Point Dosage Given to Date: 18.62 Gy
Reference Point Session Dosage Given: 2.66 Gy
Session Number: 7

## 2024-03-01 ENCOUNTER — Ambulatory Visit
Admission: RE | Admit: 2024-03-01 | Discharge: 2024-03-01 | Disposition: A | Discharge status: Home / Self Care | Source: Ambulatory Visit | Attending: Radiation Oncology | Admitting: Radiation Oncology

## 2024-03-01 ENCOUNTER — Other Ambulatory Visit: Payer: Self-pay

## 2024-03-01 ENCOUNTER — Ambulatory Visit

## 2024-03-01 DIAGNOSIS — Z510 Encounter for antineoplastic radiation therapy: Secondary | ICD-10-CM | POA: Diagnosis not present

## 2024-03-01 LAB — RAD ONC ARIA SESSION SUMMARY
Course Elapsed Days: 9
Plan Fractions Treated to Date: 8
Plan Prescribed Dose Per Fraction: 2.66 Gy
Plan Total Fractions Prescribed: 16
Plan Total Prescribed Dose: 42.56 Gy
Reference Point Dosage Given to Date: 21.28 Gy
Reference Point Session Dosage Given: 2.66 Gy
Session Number: 8

## 2024-03-02 ENCOUNTER — Ambulatory Visit

## 2024-03-03 ENCOUNTER — Ambulatory Visit

## 2024-03-03 ENCOUNTER — Other Ambulatory Visit: Payer: Self-pay

## 2024-03-03 ENCOUNTER — Ambulatory Visit
Admission: RE | Admit: 2024-03-03 | Discharge: 2024-03-03 | Disposition: A | Discharge status: Home / Self Care | Source: Ambulatory Visit | Attending: Radiation Oncology | Admitting: Radiation Oncology

## 2024-03-03 DIAGNOSIS — Z510 Encounter for antineoplastic radiation therapy: Secondary | ICD-10-CM | POA: Diagnosis not present

## 2024-03-03 LAB — RAD ONC ARIA SESSION SUMMARY
Course Elapsed Days: 11
Plan Fractions Treated to Date: 9
Plan Prescribed Dose Per Fraction: 2.66 Gy
Plan Total Fractions Prescribed: 16
Plan Total Prescribed Dose: 42.56 Gy
Reference Point Dosage Given to Date: 23.94 Gy
Reference Point Session Dosage Given: 2.66 Gy
Session Number: 9

## 2024-03-06 ENCOUNTER — Other Ambulatory Visit: Payer: Self-pay

## 2024-03-06 ENCOUNTER — Ambulatory Visit
Admission: RE | Admit: 2024-03-06 | Discharge: 2024-03-06 | Disposition: A | Discharge status: Home / Self Care | Source: Ambulatory Visit | Attending: Radiation Oncology | Admitting: Radiation Oncology

## 2024-03-06 DIAGNOSIS — Z510 Encounter for antineoplastic radiation therapy: Secondary | ICD-10-CM | POA: Diagnosis not present

## 2024-03-06 LAB — RAD ONC ARIA SESSION SUMMARY
Course Elapsed Days: 14
Plan Fractions Treated to Date: 10
Plan Prescribed Dose Per Fraction: 2.66 Gy
Plan Total Fractions Prescribed: 16
Plan Total Prescribed Dose: 42.56 Gy
Reference Point Dosage Given to Date: 26.6 Gy
Reference Point Session Dosage Given: 2.66 Gy
Session Number: 10

## 2024-03-07 ENCOUNTER — Ambulatory Visit
Admission: RE | Admit: 2024-03-07 | Discharge: 2024-03-07 | Disposition: A | Discharge status: Home / Self Care | Source: Ambulatory Visit | Attending: Radiation Oncology | Admitting: Radiation Oncology

## 2024-03-07 ENCOUNTER — Other Ambulatory Visit: Payer: Self-pay

## 2024-03-07 DIAGNOSIS — Z510 Encounter for antineoplastic radiation therapy: Secondary | ICD-10-CM | POA: Diagnosis not present

## 2024-03-07 LAB — RAD ONC ARIA SESSION SUMMARY
Course Elapsed Days: 15
Plan Fractions Treated to Date: 11
Plan Prescribed Dose Per Fraction: 2.66 Gy
Plan Total Fractions Prescribed: 16
Plan Total Prescribed Dose: 42.56 Gy
Reference Point Dosage Given to Date: 29.26 Gy
Reference Point Session Dosage Given: 2.66 Gy
Session Number: 11

## 2024-03-08 ENCOUNTER — Other Ambulatory Visit: Payer: Self-pay

## 2024-03-08 ENCOUNTER — Ambulatory Visit
Admission: RE | Admit: 2024-03-08 | Discharge: 2024-03-08 | Disposition: A | Discharge status: Home / Self Care | Source: Ambulatory Visit | Attending: Radiation Oncology | Admitting: Radiation Oncology

## 2024-03-08 DIAGNOSIS — Z510 Encounter for antineoplastic radiation therapy: Secondary | ICD-10-CM | POA: Diagnosis not present

## 2024-03-08 LAB — RAD ONC ARIA SESSION SUMMARY
Course Elapsed Days: 16
Plan Fractions Treated to Date: 12
Plan Prescribed Dose Per Fraction: 2.66 Gy
Plan Total Fractions Prescribed: 16
Plan Total Prescribed Dose: 42.56 Gy
Reference Point Dosage Given to Date: 31.92 Gy
Reference Point Session Dosage Given: 2.66 Gy
Session Number: 12

## 2024-03-09 ENCOUNTER — Other Ambulatory Visit: Payer: Self-pay

## 2024-03-09 ENCOUNTER — Ambulatory Visit
Admission: RE | Admit: 2024-03-09 | Discharge: 2024-03-09 | Disposition: A | Discharge status: Home / Self Care | Source: Ambulatory Visit | Attending: Radiation Oncology | Admitting: Radiation Oncology

## 2024-03-09 DIAGNOSIS — Z510 Encounter for antineoplastic radiation therapy: Secondary | ICD-10-CM | POA: Diagnosis not present

## 2024-03-09 LAB — RAD ONC ARIA SESSION SUMMARY
Course Elapsed Days: 17
Plan Fractions Treated to Date: 13
Plan Prescribed Dose Per Fraction: 2.66 Gy
Plan Total Fractions Prescribed: 16
Plan Total Prescribed Dose: 42.56 Gy
Reference Point Dosage Given to Date: 34.58 Gy
Reference Point Session Dosage Given: 2.66 Gy
Session Number: 13

## 2024-03-10 ENCOUNTER — Ambulatory Visit

## 2024-03-10 ENCOUNTER — Ambulatory Visit: Admitting: Radiation Oncology

## 2024-03-13 ENCOUNTER — Ambulatory Visit
Admission: RE | Admit: 2024-03-13 | Discharge: 2024-03-13 | Discharge status: Home / Self Care | Source: Ambulatory Visit | Attending: Radiation Oncology

## 2024-03-13 ENCOUNTER — Other Ambulatory Visit: Payer: Self-pay

## 2024-03-13 ENCOUNTER — Ambulatory Visit
Admission: RE | Admit: 2024-03-13 | Discharge: 2024-03-13 | Disposition: A | Discharge status: Home / Self Care | Source: Ambulatory Visit | Attending: Radiation Oncology

## 2024-03-13 DIAGNOSIS — Z510 Encounter for antineoplastic radiation therapy: Secondary | ICD-10-CM | POA: Diagnosis not present

## 2024-03-13 LAB — RAD ONC ARIA SESSION SUMMARY
Course Elapsed Days: 21
Plan Fractions Treated to Date: 14
Plan Prescribed Dose Per Fraction: 2.66 Gy
Plan Total Fractions Prescribed: 16
Plan Total Prescribed Dose: 42.56 Gy
Reference Point Dosage Given to Date: 37.24 Gy
Reference Point Session Dosage Given: 2.66 Gy
Session Number: 14

## 2024-03-14 ENCOUNTER — Other Ambulatory Visit: Payer: Self-pay

## 2024-03-14 ENCOUNTER — Ambulatory Visit

## 2024-03-14 ENCOUNTER — Ambulatory Visit
Admission: RE | Admit: 2024-03-14 | Discharge: 2024-03-14 | Disposition: A | Discharge status: Home / Self Care | Source: Ambulatory Visit | Attending: Radiation Oncology | Admitting: Radiation Oncology

## 2024-03-14 DIAGNOSIS — Z510 Encounter for antineoplastic radiation therapy: Secondary | ICD-10-CM | POA: Insufficient documentation

## 2024-03-14 DIAGNOSIS — Z170 Estrogen receptor positive status [ER+]: Secondary | ICD-10-CM | POA: Insufficient documentation

## 2024-03-14 DIAGNOSIS — C50412 Malignant neoplasm of upper-outer quadrant of left female breast: Secondary | ICD-10-CM | POA: Diagnosis not present

## 2024-03-14 DIAGNOSIS — R5383 Other fatigue: Secondary | ICD-10-CM | POA: Diagnosis not present

## 2024-03-14 LAB — RAD ONC ARIA SESSION SUMMARY
Course Elapsed Days: 22
Plan Fractions Treated to Date: 15
Plan Prescribed Dose Per Fraction: 2.66 Gy
Plan Total Fractions Prescribed: 16
Plan Total Prescribed Dose: 42.56 Gy
Reference Point Dosage Given to Date: 39.9 Gy
Reference Point Session Dosage Given: 2.66 Gy
Session Number: 15

## 2024-03-15 ENCOUNTER — Other Ambulatory Visit: Payer: Self-pay

## 2024-03-15 ENCOUNTER — Ambulatory Visit

## 2024-03-15 ENCOUNTER — Ambulatory Visit
Admission: RE | Admit: 2024-03-15 | Discharge: 2024-03-15 | Disposition: A | Discharge status: Home / Self Care | Source: Ambulatory Visit | Attending: Radiation Oncology | Admitting: Radiation Oncology

## 2024-03-15 DIAGNOSIS — Z510 Encounter for antineoplastic radiation therapy: Secondary | ICD-10-CM | POA: Diagnosis not present

## 2024-03-15 LAB — RAD ONC ARIA SESSION SUMMARY
Course Elapsed Days: 23
Plan Fractions Treated to Date: 16
Plan Prescribed Dose Per Fraction: 2.66 Gy
Plan Total Fractions Prescribed: 16
Plan Total Prescribed Dose: 42.56 Gy
Reference Point Dosage Given to Date: 42.56 Gy
Reference Point Session Dosage Given: 2.66 Gy
Session Number: 16

## 2024-03-16 ENCOUNTER — Ambulatory Visit

## 2024-03-16 ENCOUNTER — Ambulatory Visit
Admission: RE | Admit: 2024-03-16 | Discharge: 2024-03-16 | Disposition: A | Discharge status: Home / Self Care | Source: Ambulatory Visit | Attending: Radiation Oncology | Admitting: Radiation Oncology

## 2024-03-16 ENCOUNTER — Ambulatory Visit: Admitting: Radiation Oncology

## 2024-03-16 ENCOUNTER — Other Ambulatory Visit: Payer: Self-pay

## 2024-03-16 DIAGNOSIS — Z510 Encounter for antineoplastic radiation therapy: Secondary | ICD-10-CM | POA: Diagnosis not present

## 2024-03-16 LAB — RAD ONC ARIA SESSION SUMMARY
Course Elapsed Days: 24
Plan Fractions Treated to Date: 1
Plan Prescribed Dose Per Fraction: 2 Gy
Plan Total Fractions Prescribed: 4
Plan Total Prescribed Dose: 8 Gy
Reference Point Dosage Given to Date: 2 Gy
Reference Point Session Dosage Given: 2 Gy
Session Number: 17

## 2024-03-17 ENCOUNTER — Ambulatory Visit: Admitting: Radiation Oncology

## 2024-03-20 ENCOUNTER — Ambulatory Visit

## 2024-03-20 ENCOUNTER — Other Ambulatory Visit: Payer: Self-pay

## 2024-03-20 ENCOUNTER — Ambulatory Visit
Admission: RE | Admit: 2024-03-20 | Discharge: 2024-03-20 | Disposition: A | Discharge status: Home / Self Care | Source: Ambulatory Visit | Attending: Radiation Oncology

## 2024-03-20 DIAGNOSIS — Z510 Encounter for antineoplastic radiation therapy: Secondary | ICD-10-CM | POA: Diagnosis not present

## 2024-03-20 LAB — RAD ONC ARIA SESSION SUMMARY
Course Elapsed Days: 28
Plan Fractions Treated to Date: 2
Plan Prescribed Dose Per Fraction: 2 Gy
Plan Total Fractions Prescribed: 4
Plan Total Prescribed Dose: 8 Gy
Reference Point Dosage Given to Date: 4 Gy
Reference Point Session Dosage Given: 2 Gy
Session Number: 18

## 2024-03-21 ENCOUNTER — Ambulatory Visit
Admission: RE | Admit: 2024-03-21 | Discharge: 2024-03-21 | Disposition: A | Discharge status: Home / Self Care | Source: Ambulatory Visit | Attending: Radiation Oncology | Admitting: Radiation Oncology

## 2024-03-21 ENCOUNTER — Other Ambulatory Visit: Payer: Self-pay

## 2024-03-21 ENCOUNTER — Ambulatory Visit

## 2024-03-21 DIAGNOSIS — Z510 Encounter for antineoplastic radiation therapy: Secondary | ICD-10-CM | POA: Diagnosis not present

## 2024-03-21 LAB — RAD ONC ARIA SESSION SUMMARY
Course Elapsed Days: 29
Plan Fractions Treated to Date: 3
Plan Prescribed Dose Per Fraction: 2 Gy
Plan Total Fractions Prescribed: 4
Plan Total Prescribed Dose: 8 Gy
Reference Point Dosage Given to Date: 6 Gy
Reference Point Session Dosage Given: 2 Gy
Session Number: 19

## 2024-03-22 ENCOUNTER — Ambulatory Visit
Admission: RE | Admit: 2024-03-22 | Discharge: 2024-03-22 | Disposition: A | Discharge status: Home / Self Care | Source: Ambulatory Visit | Attending: Radiation Oncology | Admitting: Radiation Oncology

## 2024-03-22 ENCOUNTER — Other Ambulatory Visit: Payer: Self-pay

## 2024-03-22 DIAGNOSIS — Z510 Encounter for antineoplastic radiation therapy: Secondary | ICD-10-CM | POA: Diagnosis not present

## 2024-03-22 LAB — RAD ONC ARIA SESSION SUMMARY
Course Elapsed Days: 30
Plan Fractions Treated to Date: 4
Plan Prescribed Dose Per Fraction: 2 Gy
Plan Total Fractions Prescribed: 4
Plan Total Prescribed Dose: 8 Gy
Reference Point Dosage Given to Date: 8 Gy
Reference Point Session Dosage Given: 2 Gy
Session Number: 20

## 2024-03-24 NOTE — Radiation Completion Notes (Signed)
  Radiation Oncology         [phone] ________________________________  Name: Jody [name] MRN: [id number]  Date of Service: 03/22/2024  DOB: 1971-06-17  End of Treatment Note     Diagnosis: Stage IA, pT1cN0M0, grade 3, ER/PR positive, invasive ductal carcinoma of the left breast.   Intent: Curative     ==========DELIVERED PLANS==========  First Treatment Date: 2024-02-21 Last Treatment Date: 2024-03-22   Plan Name: Breast_L_BH Site: Breast, Left Technique: 3D Mode: Photon Dose Per Fraction: 2.66 Gy Prescribed Dose (Delivered / Prescribed): 42.56 Gy / 42.56 Gy Prescribed Fxs (Delivered / Prescribed): 16 / 16   Plan Name: Brst_L_Bst_BH Site: Breast, Left Technique: 3D Mode: Photon Dose Per Fraction: 2 Gy Prescribed Dose (Delivered / Prescribed): 8 Gy / 8 Gy Prescribed Fxs (Delivered / Prescribed): 4 / 4     ==========ON TREATMENT VISIT DATES========== 2024-02-25, 2024-03-03, 2024-03-13, 2024-03-16    See weekly On Treatment Notes in Epic for details in the Media tab (listed as Progress notes on the On Treatment Visit Dates listed above). The patient tolerated radiation. She developed fatigue and anticipated skin changes in the treatment field.   The patient will receive a call in about one month from the radiation oncology department. She will continue follow up with Dr. Lanny as well.      Donald KYM Husband, PAC

## 2024-04-21 NOTE — Progress Notes (Incomplete)
  Radiation Oncology         [phone] ________________________________  Name: Jody [name] MRN: [id number]  Date of Service: 04/24/2024  DOB: 06/30/1971  Post Treatment Telephone Note  Diagnosis:  Stage IA, pT1cN0M0, grade 3, ER/PR positive, invasive ductal carcinoma of the left breast.    The patient was not available for call today. Called patient x2. Left voicemail with a callback number.  Symptoms of fatigue {ACTIONS; HAVE/HAVE NOT:19434} improved since completing therapy.  Symptoms of skin changes {ACTIONS; HAVE/HAVE NOT:19434} improved since completing therapy.  The patient was encouraged to avoid sun exposure in the area of prior treatment for up to one year following radiation with either sunscreen or by the style of clothing worn in the sun.  The patient has scheduled follow up with her medical oncologist Dr. Lanny for ongoing surveillance, and was encouraged to call if she develops concerns or questions regarding radiation.

## 2024-04-24 ENCOUNTER — Ambulatory Visit
Admission: RE | Admit: 2024-04-24 | Discharge: 2024-04-24 | Disposition: A | Discharge status: Home / Self Care | Source: Ambulatory Visit | Attending: Nurse Practitioner | Admitting: Nurse Practitioner

## 2024-04-25 ENCOUNTER — Encounter: Payer: Self-pay | Admitting: Radiology

## 2024-04-26 ENCOUNTER — Encounter: Payer: Self-pay | Admitting: Radiology

## 2024-04-26 ENCOUNTER — Ambulatory Visit (INDEPENDENT_AMBULATORY_CARE_PROVIDER_SITE_OTHER): Admitting: Radiology

## 2024-04-26 VITALS — BP 126/84 | HR 71 | Wt 232.0 lb

## 2024-04-26 DIAGNOSIS — N939 Abnormal uterine and vaginal bleeding, unspecified: Secondary | ICD-10-CM

## 2024-04-26 LAB — PREGNANCY, URINE: Preg Test, Ur: NEGATIVE

## 2024-04-26 NOTE — Progress Notes (Signed)
   Jody [name] 1970/09/16 [id number]   History:  53 y.o. G3P3 presents with c/o irregular vaginal bleeding x 4 weeks. Just finished chemo for left breast cancer, ER2+, s/p lumpectomy 3/25. Last normal period 02/21/24. Also c/o weight gain.  Gynecologic History Patient's last menstrual period was 03/29/2024 (approximate).   Contraception/Family planning: tubal ligation Last Pap: 10/2023. Results were: normal Last mammogram: 10/01/23. Results were: abnormal  Obstetric History OB History  Gravida Para Term Preterm AB Living  3 3 0 0 0 3  SAB IAB Ectopic Multiple Live Births  0 0 0 0 0    # Outcome Date GA Lbr Len/2nd Weight Sex Type Anes PTL Lv  3 Para      Vag-Spont     2 Para      Vag-Spont     1 Para      Vag-Spont          The following portions of the patient's history were reviewed and updated as appropriate: allergies, current medications, past family history, past medical history, past social history, past surgical history, and problem list.  Review of Systems  All other systems reviewed and are negative.   Past medical history, past surgical history, family history and social history were all reviewed and documented in the EPIC chart.  Exam:  Vitals:   04/26/24 1609  BP: 126/84  Pulse: 71  SpO2: 95%  Weight: 232 lb (105.2 kg)   Body mass index is 39.21 kg/m.  Physical Exam Vitals and nursing note reviewed. Exam conducted with a chaperone present.  Constitutional:      Appearance: Normal appearance. She is well-developed. She is obese.  Pulmonary:     Effort: Pulmonary effort is normal.  Abdominal:     General: Abdomen is flat.     Palpations: Abdomen is soft.  Genitourinary:    General: Normal vulva.     Vagina: Bleeding present. No vaginal discharge, erythema or lesions.     Cervix: Normal. No discharge, friability, lesion or erythema.     Uterus: Normal.      Adnexa: Right adnexa normal and left adnexa normal.  Neurological:     Mental  Status: She is alert.  Psychiatric:        Mood and Affect: Mood normal.        Thought Content: Thought content normal.        Judgment: Judgment normal.      Jody [name], CMA present for exam  Assessment/Plan:   1. Abnormal uterine bleeding (AUB) (Primary) - Pregnancy, urine; negative - US  PELVIS TRANSVAGINAL NON-OB (TV ONLY); Future  Will contact with results of u/s and manage accordingly.   Jody [name] B WHNP-BC 4:17 PM 04/26/2024

## 2024-04-27 ENCOUNTER — Ambulatory Visit (INDEPENDENT_AMBULATORY_CARE_PROVIDER_SITE_OTHER)

## 2024-04-27 DIAGNOSIS — N939 Abnormal uterine and vaginal bleeding, unspecified: Secondary | ICD-10-CM | POA: Diagnosis not present

## 2024-05-02 ENCOUNTER — Ambulatory Visit: Payer: Self-pay | Admitting: Radiology

## 2024-05-02 ENCOUNTER — Other Ambulatory Visit: Payer: Self-pay

## 2024-05-02 DIAGNOSIS — N939 Abnormal uterine and vaginal bleeding, unspecified: Secondary | ICD-10-CM

## 2024-05-02 MED ORDER — MISOPROSTOL 200 MCG PO TABS
200.0000 ug | ORAL_TABLET | Freq: Once | ORAL | 0 refills | Status: DC
Start: 2024-05-02 — End: 2024-06-20

## 2024-05-08 ENCOUNTER — Other Ambulatory Visit: Payer: Self-pay | Admitting: Family Medicine

## 2024-05-08 DIAGNOSIS — K644 Residual hemorrhoidal skin tags: Secondary | ICD-10-CM

## 2024-06-02 ENCOUNTER — Telehealth: Payer: Self-pay

## 2024-06-02 ENCOUNTER — Ambulatory Visit
Admission: RE | Admit: 2024-06-02 | Discharge: 2024-06-02 | Disposition: A | Discharge status: Home / Self Care | Source: Ambulatory Visit | Attending: Radiation Oncology | Admitting: Radiation Oncology

## 2024-06-02 ENCOUNTER — Encounter: Payer: Self-pay | Admitting: Plastic Surgery

## 2024-06-02 ENCOUNTER — Other Ambulatory Visit: Payer: Self-pay

## 2024-06-02 ENCOUNTER — Ambulatory Visit: Admitting: Plastic Surgery

## 2024-06-02 VITALS — BP 115/80 | HR 76 | Ht 64.0 in | Wt 229.0 lb

## 2024-06-02 DIAGNOSIS — Z923 Personal history of irradiation: Secondary | ICD-10-CM

## 2024-06-02 DIAGNOSIS — Z853 Personal history of malignant neoplasm of breast: Secondary | ICD-10-CM

## 2024-06-02 DIAGNOSIS — Z170 Estrogen receptor positive status [ER+]: Secondary | ICD-10-CM | POA: Insufficient documentation

## 2024-06-02 DIAGNOSIS — C50412 Malignant neoplasm of upper-outer quadrant of left female breast: Secondary | ICD-10-CM | POA: Insufficient documentation

## 2024-06-02 DIAGNOSIS — Z08 Encounter for follow-up examination after completed treatment for malignant neoplasm: Secondary | ICD-10-CM | POA: Diagnosis not present

## 2024-06-02 NOTE — Progress Notes (Signed)
  Radiation Oncology         [phone] ________________________________  Name: Jody [name] MRN: [id number]  Date of Service: 04/24/2024  DOB: [birth year]  Post Treatment Telephone Note  Diagnosis:  Stage IA, [id number], grade 3, ER/PR positive, invasive ductal carcinoma of the left breast.    The patient was available for call today.   Symptoms of fatigue have improved since completing therapy.  Symptoms of skin changes have improved since completing therapy.  The patient was encouraged to avoid sun exposure in the area of prior treatment for up to one year following radiation with either sunscreen or by the style of clothing worn in the sun.  The patient has scheduled follow up with her medical oncologist Dr. Lanny for ongoing surveillance, and was encouraged to call if she develops concerns or questions regarding radiation.

## 2024-06-02 NOTE — Telephone Encounter (Signed)
 Received telephone call from the patient inquiring if it is time for her to have a follow-up office visit w/ Dr. Lanny. Patient stated she has had the lympectomy-March,2025, as well as completed radiation.  Patient has not had the MRI Breast. Let patient know that Dr. Lanny is not in the office today and that I would forward her message to Dr. Lanny and her team & someone will f/u when we have an answer for her.

## 2024-06-02 NOTE — Progress Notes (Signed)
   Subjective:    Patient ID: Jody [name], female    DOB: May 16, 1971, 53 y.o.   MRN: [id number]  The patient is a 53 year old female here for follow-up after undergoing oncoplastic breast reduction for left sided breast cancer.  She had 850 g removed from the right breast and 900 g removed from the left breast in March 2025.  Overall she is doing really well no signs of infection.  The skin has healed very nicely and no hypertrophy of the scars.  I have encouraged her to keep her regular yearly mammograms as well.  She did finish radiation.      Review of Systems  Constitutional: Negative.   Eyes: Negative.   Respiratory: Negative.    Cardiovascular: Negative.   Gastrointestinal: Negative.   Endocrine: Negative.   Genitourinary: Negative.   Musculoskeletal: Negative.        Objective:   Physical Exam Vitals reviewed.  Constitutional:      Appearance: Normal appearance.  HENT:     Head: Atraumatic.  Cardiovascular:     Rate and Rhythm: Normal rate.     Pulses: Normal pulses.  Pulmonary:     Effort: Pulmonary effort is normal.  Abdominal:     Palpations: Abdomen is soft.  Skin:    General: Skin is warm.     Capillary Refill: Capillary refill takes less than 2 seconds.  Neurological:     Mental Status: She is alert and oriented to person, place, and time.  Psychiatric:        Mood and Affect: Mood normal.        Behavior: Behavior normal.        Thought Content: Thought content normal.        Judgment: Judgment normal.        Assessment & Plan:     ICD-10-CM   1. Malignant neoplasm of upper-outer quadrant of left breast in female, estrogen receptor positive (HCC)  C50.412    Z17.0       Pictures were obtained of the patient and placed in the chart with the patient's or guardian's permission. Continue massage to the incision sites and sports bra as able.  Follow-up as needed.

## 2024-06-05 ENCOUNTER — Ambulatory Visit: Attending: Hematology

## 2024-06-05 DIAGNOSIS — C50412 Malignant neoplasm of upper-outer quadrant of left female breast: Secondary | ICD-10-CM | POA: Insufficient documentation

## 2024-06-05 DIAGNOSIS — Z483 Aftercare following surgery for neoplasm: Secondary | ICD-10-CM | POA: Insufficient documentation

## 2024-06-05 DIAGNOSIS — Z170 Estrogen receptor positive status [ER+]: Secondary | ICD-10-CM | POA: Insufficient documentation

## 2024-06-05 DIAGNOSIS — Z9189 Other specified personal risk factors, not elsewhere classified: Secondary | ICD-10-CM | POA: Insufficient documentation

## 2024-06-05 DIAGNOSIS — R293 Abnormal posture: Secondary | ICD-10-CM | POA: Insufficient documentation

## 2024-06-09 ENCOUNTER — Encounter: Payer: Self-pay | Admitting: Radiology

## 2024-06-09 ENCOUNTER — Other Ambulatory Visit (HOSPITAL_COMMUNITY)
Admission: RE | Admit: 2024-06-09 | Discharge: 2024-06-09 | Disposition: A | Discharge status: Home / Self Care | Source: Ambulatory Visit | Attending: Radiology | Admitting: Radiology

## 2024-06-09 ENCOUNTER — Ambulatory Visit (INDEPENDENT_AMBULATORY_CARE_PROVIDER_SITE_OTHER): Admitting: Radiology

## 2024-06-09 VITALS — BP 124/78 | HR 92 | Wt 232.0 lb

## 2024-06-09 DIAGNOSIS — Z01812 Encounter for preprocedural laboratory examination: Secondary | ICD-10-CM

## 2024-06-09 DIAGNOSIS — N939 Abnormal uterine and vaginal bleeding, unspecified: Secondary | ICD-10-CM | POA: Diagnosis present

## 2024-06-09 LAB — PREGNANCY, URINE: Preg Test, Ur: NEGATIVE

## 2024-06-09 NOTE — Progress Notes (Signed)
      ENDOMETRIAL BIOPSY       Jody [name] 53 y.o. presents for endometrial biopsy. Reason for biopsy: irregular bleeding  The indications for endometrial biopsy were reviewed.    Risks of the biopsy including cramping, bleeding, infection, uterine perforation, inadequate specimen and need for additional procedures  were discussed.  The patient states she understands and agrees to undergo procedure today. Consent obtained.  Time out was performed.   Procedure Speculum inserted into the vagina, cervix visualized and was prepped with Betadine. A single-toothed tenaculum was placed on the anterior lip of the cervix to stabilize it.  The 3 mm pipelle was introduced into the endometrial cavity without difficulty to a depth of 9cm, suction initiated and a moderate amount of tissue was obtained and sent to pathology.  The instruments were removed from the patient's vagina.  Minimal bleeding from the cervix was noted.  The patient tolerated the procedure well.   Darice Hoit, CMA present for exam  Assessment/Plan: 1. Abnormal uterine bleeding (AUB) (Primary) - Surgical pathology( [place])  2. Encounter for preprocedural laboratory examination - Pregnancy, urine; negative    Routine post-procedure instructions were given to the patient.   Will contact with results of biopsy.    Sultan Pargas, WHNP

## 2024-06-13 LAB — SURGICAL PATHOLOGY

## 2024-06-14 ENCOUNTER — Ambulatory Visit: Payer: Self-pay | Admitting: Radiology

## 2024-06-14 ENCOUNTER — Telehealth: Payer: Self-pay | Admitting: *Deleted

## 2024-06-14 NOTE — Telephone Encounter (Signed)
 Spoke with patient, advised of results.  Patient reports light bleeding day of EMB 06/09/24, increasing since. She wears a pad and tampon at night and while at work, is changing this combination twice daily. Next menses not due to start until 10/9, is uncertain if this is due to menses or EMB.   Reports dizziness when moving from sitting to standing. Menses cramps 5 out of 10 on pain scale. Pain improves with tylenol  or motrin OTC. Denies odor, fever/chills, N/V. Patient is asking if she should go back on something orally?   Hx BTL.   ER precaution provided for heavy bleeding, sever pain, worsening symptoms. Advised I will send to [facility] to review and f/u, patient agreeable.

## 2024-06-14 NOTE — Telephone Encounter (Signed)
 Likely due to EMB. Continue to monitor bleeding. Because of her breast cancer diagnosis this year I would avoid hormones until she checks with her oncologist.

## 2024-06-14 NOTE — Telephone Encounter (Signed)
 Patient notified. Encounter closed

## 2024-06-15 ENCOUNTER — Ambulatory Visit: Payer: Self-pay

## 2024-06-15 NOTE — Telephone Encounter (Signed)
 Appt scheduled

## 2024-06-15 NOTE — Telephone Encounter (Signed)
 FYI Only or Action Required?: FYI only for provider.  Patient was last seen in primary care on 12/21/2022 by [name], [name], DO.  Called Nurse Triage reporting right sided Chest Pain that tcomes and goes.  Symptoms began 2 weeks.  Interventions attempted: Nothing.  Symptoms are: stable.  Triage Disposition: See PCP When Office is Open (Within 3 Days)  Patient/caregiver understands and will follow disposition?: Yes     Copied from CRM #[id number]. Topic: Clinical - Red Word Triage >> Jun 15, 2024 10:24 AM Ahlexyia S wrote: Red Word that prompted transfer to Nurse Triage: Pt stated she is having frequent headaches, dizziness and chest pains. Pt mentioned that sometimes her chest pains can get a little severe. When asked how long pt stated a couple of weeks. Warm transferred to nurse triage. Reason for Disposition  [1] Chest pain from known angina comes and goes AND [2] is NOT happening more often (increasing in frequency) or getting worse (increasing in severity)  Answer Assessment - Initial Assessment Questions 1. LOCATION: Where does it hurt?       Right  2. RADIATION: Does the pain go anywhere else? (e.g., into neck, jaw, arms, back)     Right arm ache 3. ONSET: When did the chest pain begin? (Minutes, hours or days)      2 weeks  4. PATTERN: Does the pain come and go, or has it been constant since it started?  Does it get worse with exertion?      Comes and goes  5. DURATION: How long does it last (e.g., seconds, minutes, hours)     Over 30 minutes 6. SEVERITY: How bad is the pain?  (e.g., Scale 1-10; mild, moderate, or severe)     Not having it now  7. CARDIAC RISK FACTORS: Do you have any history of heart problems or risk factors for heart disease? (e.g., angina, prior heart attack; diabetes, high blood pressure, high cholesterol, smoker, or strong family history of heart disease)     HTN 8. PULMONARY RISK FACTORS: Do you have any history of lung  disease?  (e.g., blood clots in lung, asthma, emphysema, birth control pills)     no 9. CAUSE: What do you think is causing the chest pain?     unsure 10. OTHER SYMPTOMS: Do you have any other symptoms? (e.g., dizziness, nausea, vomiting, sweating, fever, difficulty breathing, cough)       Dizziness, headache,  Protocols used: Chest Pain-A-AH

## 2024-06-16 ENCOUNTER — Other Ambulatory Visit: Payer: Self-pay | Admitting: Family Medicine

## 2024-06-16 ENCOUNTER — Ambulatory Visit: Admitting: Family Medicine

## 2024-06-16 ENCOUNTER — Ambulatory Visit: Payer: Self-pay | Admitting: Family Medicine

## 2024-06-16 ENCOUNTER — Other Ambulatory Visit

## 2024-06-16 ENCOUNTER — Encounter: Payer: Self-pay | Admitting: Family Medicine

## 2024-06-16 VITALS — BP 122/100 | HR 70 | Temp 98.2°F | Resp 18 | Ht 64.0 in | Wt 231.4 lb

## 2024-06-16 DIAGNOSIS — R079 Chest pain, unspecified: Secondary | ICD-10-CM | POA: Diagnosis not present

## 2024-06-16 DIAGNOSIS — K219 Gastro-esophageal reflux disease without esophagitis: Secondary | ICD-10-CM | POA: Diagnosis not present

## 2024-06-16 DIAGNOSIS — R7989 Other specified abnormal findings of blood chemistry: Secondary | ICD-10-CM

## 2024-06-16 DIAGNOSIS — I10 Essential (primary) hypertension: Secondary | ICD-10-CM

## 2024-06-16 LAB — CBC WITH DIFFERENTIAL/PLATELET
Basophils Absolute: 0.1 K/uL (ref 0.0–0.1)
Basophils Relative: 1.3 % (ref 0.0–3.0)
Eosinophils Absolute: 0.1 K/uL (ref 0.0–0.7)
Eosinophils Relative: 2.2 % (ref 0.0–5.0)
HCT: 36.7 % (ref 36.0–46.0)
Hemoglobin: 11.9 g/dL — ABNORMAL LOW (ref 12.0–15.0)
Lymphocytes Relative: 50.1 % — ABNORMAL HIGH (ref 12.0–46.0)
Lymphs Abs: 2.3 K/uL (ref 0.7–4.0)
MCHC: 32.3 g/dL (ref 30.0–36.0)
MCV: 80.2 fl (ref 78.0–100.0)
Monocytes Absolute: 0.4 K/uL (ref 0.1–1.0)
Monocytes Relative: 9.3 % (ref 3.0–12.0)
Neutro Abs: 1.7 K/uL (ref 1.4–7.7)
Neutrophils Relative %: 37.1 % — ABNORMAL LOW (ref 43.0–77.0)
Platelets: 237 K/uL (ref 150.0–400.0)
RBC: 4.58 Mil/uL (ref 3.87–5.11)
RDW: 16.7 % — ABNORMAL HIGH (ref 11.5–15.5)
WBC: 4.6 K/uL (ref 4.0–10.5)

## 2024-06-16 LAB — COMPREHENSIVE METABOLIC PANEL WITH GFR
ALT: 10 U/L (ref 0–35)
AST: 17 U/L (ref 0–37)
Albumin: 4 g/dL (ref 3.5–5.2)
Alkaline Phosphatase: 58 U/L (ref 39–117)
BUN: 8 mg/dL (ref 6–23)
CO2: 27 meq/L (ref 19–32)
Calcium: 9.2 mg/dL (ref 8.4–10.5)
Chloride: 101 meq/L (ref 96–112)
Creatinine, Ser: 0.74 mg/dL (ref 0.40–1.20)
GFR: 93.8 mL/min (ref >60.00)
Glucose, Bld: 80 mg/dL (ref 70–99)
Potassium: 3.5 meq/L (ref 3.5–5.1)
Sodium: 136 meq/L (ref 135–145)
Total Bilirubin: 0.8 mg/dL (ref 0.2–1.2)
Total Protein: 7.8 g/dL (ref 6.0–8.3)

## 2024-06-16 LAB — LIPID PANEL
Cholesterol: 156 mg/dL (ref 0–200)
HDL: 55.9 mg/dL (ref >39.00)
LDL Cholesterol: 88 mg/dL (ref 0–99)
NonHDL: 100.08
Total CHOL/HDL Ratio: 3
Triglycerides: 59 mg/dL (ref 0.0–149.0)
VLDL: 11.8 mg/dL (ref 0.0–40.0)

## 2024-06-16 LAB — TROPONIN I (HIGH SENSITIVITY): High Sens Troponin I: 4 ng/L (ref 2–17)

## 2024-06-16 MED ORDER — ONDANSETRON 4 MG PO TBDP: 4.0000 mg | ORAL_TABLET | Freq: Three times a day (TID) | ORAL | 0 refills | Status: AC | PRN

## 2024-06-16 MED ORDER — AMLODIPINE BESYLATE 5 MG PO TABS: 5.0000 mg | ORAL_TABLET | Freq: Every day | ORAL | 3 refills | Status: AC

## 2024-06-16 MED ORDER — PANTOPRAZOLE SODIUM 40 MG PO TBEC: 40.0000 mg | DELAYED_RELEASE_TABLET | Freq: Every day | ORAL | 3 refills | Status: DC

## 2024-06-16 NOTE — Progress Notes (Signed)
 Subjective:    Patient ID: Jody [name], female    DOB: [birth year], 53 y.o.   MRN: [id number]  Chief Complaint  Patient presents with   Chest Pain    X2 weeks, right side chest pain, headaches and dizziness. Pt states unable to sleep and having vomiting    HPI Patient is in today for chest pain.   . Discussed the use of AI scribe software for clinical note transcription with the patient, who gave verbal consent to proceed.  History of Present Illness Jody [name] is a 53 year old female with breast cancer who presents with chest pain.  She experiences chest pain primarily on the right side, radiating to her right arm. The pain is described as throbbing and increasing in intensity, severe enough to wake her from sleep. She experiences shortness of breath during these episodes and has vomited twice this morning, which is a new symptom for her. No coughing, but she feels 'a little wheezy' and notes that her right side remains sore. No burping or reflux symptoms, although she takes Pepcid  20 mg twice daily for reflux. She has not taken Zofran  recently, which she used post-surgery.  Her past medical history includes breast cancer, for which she underwent radiation therapy on the left side. She had a mammogram in December or January, which led to the cancer diagnosis, and she had surgery in March. She has not had a recent mammogram, CT scan, or MRI, but she is scheduled to see her oncologist next Wednesday.  She reports a headache yesterday and feels dizzy at times. She is currently taking metoprolol . She lives in [place] and works part-time in [place].   Past Medical History:  Diagnosis Date   Anemia    Anxiety    Asthma    Breast cancer (HCC)    Depression    Endometriosis    GERD (gastroesophageal reflux disease)    History of anal fissures    History of gallstones    Hypertension    Migraine headache with aura    Migraines    without aura   Obesity      Past Surgical History:  Procedure Laterality Date   BREAST BIOPSY Left 10/08/2023   MM LT BREAST BX W LOC DEV 1ST LESION IMAGE BX SPEC STEREO GUIDE 10/08/2023 [facility]   BREAST BIOPSY Left 10/08/2023   MM LT BREAST BX W LOC DEV EA AD LESION IMG BX SPEC STEREO GUIDE 10/08/2023 [facility]   BREAST BIOPSY  11/16/2023   MM LT RADIOACTIVE SEED LOC MAMMO GUIDE 11/16/2023 [facility]   BREAST BIOPSY  11/16/2023   MM LT RADIOACTIVE SEED EA ADD LESION LOC MAMMO GUIDE 11/16/2023 [facility]   BREAST LUMPECTOMY Left 12/13/2023   Procedure: BREAST LUMPECTOMY;  Surgeon: [name], MD;  Location: [facility];  Service: General;  Laterality: Left;  RE-EXCISION LEFT BREAST CANCER   BREAST LUMPECTOMY WITH RADIOACTIVE SEED AND SENTINEL LYMPH NODE BIOPSY Left 11/17/2023   Procedure: LEFT BREAST SEED GUIDED BRACKETED LUMPECTOMY AND SENTINEL LYMPH NODE BIOPSY;  Surgeon: [name], MD;  Location: [facility];  Service: General;  Laterality: Left;   BREAST REDUCTION SURGERY Bilateral 12/13/2023   Procedure: MAMMOPLASTY, REDUCTION;  Surgeon: Lowery Estefana RAMAN, DO;  Location: [facility];  Service: Plastics;  Laterality: Bilateral;  ONCOPLASTIC BILATERAL BREAST REDUCTION WITH LIPSUCTION   CHOLECYSTECTOMY     ESOPHAGOGASTRODUODENOSCOPY     FLEXIBLE SIGMOIDOSCOPY  03/09/2012   Procedure: FLEXIBLE  SIGMOIDOSCOPY;  Surgeon: Lamar JONETTA Aho, MD;  Location: [facility];  Service: Endoscopy;  Laterality: N/A;   gastric sleeve surgery  03/14/2021   IR IMAGE GUIDED FLUID DRAIN BY CATHETER  01/28/2024   TUBAL LIGATION     Essure    Family History  Problem Relation Age of Onset   Breast cancer Mother 8       no genetic testing   Hypertension Mother    Hepatitis Father 49       Hep C   Diabetes Father    Arthritis Father    Hypertension Father    Kidney disease Father    Heart attack Father 81   Breast cancer Maternal Aunt        dx  late 35s; mother's mat half sister   Cancer Maternal Uncle        x2 mat uncles; unk type; dx >50   Coronary artery disease Maternal Grandmother    Cataracts Maternal Grandmother    Glaucoma Maternal Grandmother    Heart disease Maternal Grandmother    Colon cancer Paternal Grandmother        dx >50   Obesity Paternal Grandmother    Cancer Cousin        unk type; dx 86s; mat female cousin   Obesity Other    Stomach cancer Neg Hx    Rectal cancer Neg Hx     Social History   Socioeconomic History   Marital status: Single    Spouse name: Not on file   Number of children: 3   Years of education: Not on file   Highest education level: Not on file  Occupational History   Occupation: disability    Employer: [employer]  Tobacco Use   Smoking status: Never   Smokeless tobacco: Never  Vaping Use   Vaping status: Never Used  Substance and Sexual Activity   Alcohol use: No   Drug use: No   Sexual activity: Not Currently    Partners: Male    Birth control/protection: Other-see comments    Comment: Essure  Other Topics Concern   Not on file  Social History Narrative   Exercise-- walking 30 min daily   Social Drivers of Health   Financial Resource Strain: High Risk (10/26/2023)   Overall Financial Resource Strain (CARDIA)    Difficulty of Paying Living Expenses: Very hard  Food Insecurity: No Food Insecurity (01/06/2024)   Hunger Vital Sign    Worried About Running Out of Food in the Last Year: Never true    Ran Out of Food in the Last Year: Never true  Transportation Needs: Unmet Transportation Needs (10/22/2023)   PRAPARE - Administrator, Civil Service (Medical): Yes    Lack of Transportation (Non-Medical): Yes  Physical Activity: Not on file  Stress: Not on file  Social Connections: Not on file  Intimate Partner Violence: Not At Risk (01/06/2024)   Humiliation, Afraid, Rape, and Kick questionnaire    Fear of Current or Ex-Partner: No     Emotionally Abused: No    Physically Abused: No    Sexually Abused: No    Outpatient Medications Prior to Visit  Medication Sig Dispense Refill   albuterol  (VENTOLIN  HFA) 108 (90 Base) MCG/ACT inhaler INHALE 2 PUFFS 4 TIMES A DAY AS NEEDED 18 each 1   famotidine  (PEPCID ) 20 MG tablet TAKE 1 TABLET BY MOUTH TWICE A DAY 180 tablet 0   fluticasone  (FLONASE ) 50 MCG/ACT nasal spray SPRAY  2 SPRAYS INTO EACH NOSTRIL EVERY DAY 48 mL 1   hydrocortisone  (ANUSOL -HC) 25 MG suppository PLACE 1 SUPPOSITORY RECTALLY 2 (TWO) TIMES DAILY AS NEEDED FOR HEMORRHOIDS OR ANAL ITCHING 24 suppository 0   ibuprofen (ADVIL) 800 MG tablet Take 800 mg by mouth every 6 (six) hours as needed.     LORazepam  (ATIVAN ) 1 MG tablet Take 0.5 tablets (0.5 mg total) by mouth daily as needed for anxiety. 30 tablet 0   metoprolol  tartrate (LOPRESSOR ) 50 MG tablet Take 1 tablet (50 mg total) by mouth 2 (two) times daily. 180 tablet 1   misoprostol  (CYTOTEC ) 200 MCG tablet Place 1 tablet (200 mcg total) vaginally once for 1 dose. Before bed 1 tablet 0   Multiple Vitamin (MULTI VITAMIN PO) Take by mouth.     traMADol  (ULTRAM ) 50 MG tablet Take 1 tablet (50 mg total) by mouth every 6 (six) hours as needed. 20 tablet 0   ondansetron  (ZOFRAN -ODT) 4 MG disintegrating tablet Take 1 tablet (4 mg total) by mouth every 8 (eight) hours as needed for nausea or vomiting. 20 tablet 0   No facility-administered medications prior to visit.    No Known Allergies  Review of Systems  Constitutional:  Negative for chills, fever and malaise/fatigue.  HENT:  Negative for congestion and hearing loss.   Eyes:  Negative for blurred vision and discharge.  Respiratory:  Positive for shortness of breath. Negative for cough and sputum production.   Cardiovascular:  Positive for chest pain. Negative for palpitations and leg swelling.  Gastrointestinal:  Negative for abdominal pain, blood in stool, constipation, diarrhea, heartburn, nausea and vomiting.   Genitourinary:  Negative for dysuria, frequency, hematuria and urgency.  Musculoskeletal:  Negative for back pain, falls and myalgias.  Skin:  Negative for rash.  Neurological:  Negative for dizziness, sensory change, loss of consciousness, weakness and headaches.  Endo/Heme/Allergies:  Negative for environmental allergies. Does not bruise/bleed easily.  Psychiatric/Behavioral:  Negative for depression and suicidal ideas. The patient is not nervous/anxious and does not have insomnia.        Objective:    Physical Exam Vitals and nursing note reviewed.  Constitutional:      General: She is not in acute distress.    Appearance: Normal appearance. She is well-developed.  HENT:     Head: Normocephalic and atraumatic.  Eyes:     General: No scleral icterus.       Right eye: No discharge.        Left eye: No discharge.  Cardiovascular:     Rate and Rhythm: Normal rate and regular rhythm.     Heart sounds: No murmur heard. Pulmonary:     Effort: Pulmonary effort is normal. No respiratory distress.     Breath sounds: Normal breath sounds.  Chest:     Chest wall: Tenderness present.    Musculoskeletal:        General: Normal range of motion.     Cervical back: Normal range of motion and neck supple.     Right lower leg: No edema.     Left lower leg: No edema.  Skin:    General: Skin is warm and dry.  Neurological:     Mental Status: She is alert and oriented to person, place, and time.  Psychiatric:        Mood and Affect: Mood normal.        Behavior: Behavior normal.        Thought Content: Thought content normal.  Judgment: Judgment normal.     BP (!) 122/100 (BP Location: Right Arm, Patient Position: Sitting, Cuff Size: Large)   Pulse 70   Temp 98.2 F (36.8 C) (Oral)   Resp 18   Ht 5' 4 (1.626 m)   Wt 231 lb 6.4 oz (105 kg)   LMP 05/23/2024 (Exact Date)   SpO2 98%   BMI 39.72 kg/m  Wt Readings from Last 3 Encounters:  06/16/24 231 lb 6.4 oz (105 kg)   06/09/24 232 lb (105.2 kg)  06/02/24 229 lb (103.9 kg)    Diabetic Foot Exam - Simple   No data filed    Lab Results  Component Value Date   WBC 4.4 10/20/2023   HGB 13.3 10/20/2023   HCT 41.0 10/20/2023   PLT 217 10/20/2023   GLUCOSE 77 10/20/2023   CHOL 157 12/21/2022   TRIG 60.0 12/21/2022   HDL 54.30 12/21/2022   LDLCALC 91 12/21/2022   ALT 6 10/20/2023   AST 15 10/20/2023   NA 137 10/20/2023   K 4.1 10/20/2023   CL 108 10/20/2023   CREATININE 0.79 10/20/2023   BUN 10 10/20/2023   CO2 25 10/20/2023   TSH 0.96 11/12/2023   INR 1.03 12/11/2010   HGBA1C 5.9 06/03/2015    Lab Results  Component Value Date   TSH 0.96 11/12/2023   Lab Results  Component Value Date   WBC 4.4 10/20/2023   HGB 13.3 10/20/2023   HCT 41.0 10/20/2023   MCV 87.4 10/20/2023   PLT 217 10/20/2023   Lab Results  Component Value Date   NA 137 10/20/2023   K 4.1 10/20/2023   CO2 25 10/20/2023   GLUCOSE 77 10/20/2023   BUN 10 10/20/2023   CREATININE 0.79 10/20/2023   BILITOT 0.9 10/20/2023   ALKPHOS 53 10/20/2023   AST 15 10/20/2023   ALT 6 10/20/2023   PROT 6.9 10/20/2023   ALBUMIN 3.8 10/20/2023   CALCIUM 8.7 (L) 10/20/2023   ANIONGAP 4 (L) 10/20/2023   GFR 97.95 12/21/2022   Lab Results  Component Value Date   CHOL 157 12/21/2022   Lab Results  Component Value Date   HDL 54.30 12/21/2022   Lab Results  Component Value Date   LDLCALC 91 12/21/2022   Lab Results  Component Value Date   TRIG 60.0 12/21/2022   Lab Results  Component Value Date   CHOLHDL 3 12/21/2022   Lab Results  Component Value Date   HGBA1C 5.9 06/03/2015       Assessment & Plan:  Chest pain, unspecified type -     EKG 12-Lead -     Comprehensive metabolic panel with GFR -     CBC with Differential/Platelet -     Troponin I (High Sensitivity) -     D-dimer, quantitative -     DG Chest 2 View; Future -     ECHOCARDIOGRAM COMPLETE; Future -     Ambulatory referral to Cardiology -      Lipid panel -     Lipoprotein A (LPA)  Gastroesophageal reflux disease, unspecified whether esophagitis present -     Pantoprazole Sodium; Take 1 tablet (40 mg total) by mouth daily.  Dispense: 30 tablet; Refill: 3  Essential hypertension -     amLODIPine Besylate; Take 1 tablet (5 mg total) by mouth daily.  Dispense: 90 tablet; Refill: 3  Other orders -     Ondansetron ; Take 1 tablet (4 mg total) by mouth every 8 (eight)  hours as needed for nausea or vomiting.  Dispense: 20 tablet; Refill: 0   Assessment and Plan Assessment & Plan Chest pain with right arm involvement   Intermittent chest pain on the right side radiates to the right arm, accompanied by shortness of breath and vomiting, suggesting a possible cardiac cause despite a normal EKG. Order blood work to check cardiac enzymes and refer to cardiology for further evaluation. She should go to the emergency room if symptoms worsen.  Nausea and vomiting, acute episode   An acute episode of nausea and vomiting occurred with chest pain, raising concern for a cardiac cause. She previously used Zofran  for nausea post-surgery. Prescribe Zofran  as needed for nausea.  Hypertension   Hypertension management continues with metoprolol . Advise her to monitor blood pressure at home with a properly fitting cuff.  Gastroesophageal reflux disease (GERD)   GERD symptoms persist with breakthrough despite Pepcid  20 mg twice daily. The recent vomiting episode is atypical for her usual reflux symptoms. Prescribe Protonix for morning use and continue Pepcid  if needed for breakthrough symptoms.  Left breast cancer, status post radiation and surgery   Continue follow-up with oncology as scheduled.   Jeryn Bertoni R Lowne Chase, DO

## 2024-06-20 ENCOUNTER — Ambulatory Visit: Attending: Cardiology | Admitting: Cardiology

## 2024-06-20 ENCOUNTER — Encounter: Payer: Self-pay | Admitting: Cardiology

## 2024-06-20 VITALS — BP 113/73 | HR 74 | Resp 14 | Ht 64.0 in | Wt 232.0 lb

## 2024-06-20 DIAGNOSIS — Z6839 Body mass index (BMI) 39.0-39.9, adult: Secondary | ICD-10-CM | POA: Diagnosis not present

## 2024-06-20 DIAGNOSIS — I10 Essential (primary) hypertension: Secondary | ICD-10-CM | POA: Insufficient documentation

## 2024-06-20 DIAGNOSIS — E66812 Obesity, class 2: Secondary | ICD-10-CM | POA: Insufficient documentation

## 2024-06-20 DIAGNOSIS — R0789 Other chest pain: Secondary | ICD-10-CM | POA: Diagnosis present

## 2024-06-20 LAB — D-DIMER, QUANTITATIVE: D-Dimer, Quant: 2.72 ug{FEU}/mL — ABNORMAL HIGH (ref <0.50)

## 2024-06-20 LAB — LIPOPROTEIN A (LPA): Lipoprotein (a): 35 nmol/L (ref <75)

## 2024-06-20 NOTE — Progress Notes (Signed)
 Cardiology Office Note:  .   Date:  06/20/2024  ID:  Jody [name], DOB 08/19/1971, MRN [id number] PCP: Jody [name], Jody SAUNDERS, DO  [facility] Providers Cardiologist:  None   History of Present Illness: .   Jody [name] is a 53 y.o. Female patient referred to me for evaluation of chest pain.  Patient has history of breast cancer SP radioactive seed implantation to the left breast along with lumpectomy and bilateral mammoplasty and breast reduction in March 2025.  Her past medical history significant for hypertension and GERD. H/O Gastric sleeve surgery in 2021.   CP started 3 weeks ago and woke her up from sleep, described as sharp pain in the right upper part of her chest, having difficulty taking deep breaths as it was heart more.  Episode lasted for more 20 to 30 minutes and has had occasional episodes since that there started 3 weeks ago.  Otherwise has resumed all her activities including activities of daily living and also exercises without recurrence of chest pain.  Cardiac Studies relevent.        Discussed the use of AI scribe software for clinical note transcription with the patient, who gave verbal consent to proceed.  History of Present Illness Jody [name] is a 53 year old female who presents with chest pain. She was referred by Dr. Jamee Bers for evaluation of chest discomfort.  Chest pain and associated symptoms - Chest pain began approximately three weeks ago - Pain is located on the right side, near the upper right chest, and radiates to the arm - Pain is exacerbated by deep breathing - Episodes last about 30 minutes - Pain occasionally wakes her at night and also occurs during the day - Pacing provides some relief - Associated symptoms include visual changes and headaches during episodes  Hypertension - Hypertension managed with amlodipine 5 mg once daily and metoprolol  tartrate 50 mg twice daily  Gastroesophageal reflux  disease - History of acid reflux  Breast cancer and surgical history - History of left breast cancer treated with lumpectomy and radioactive seed placement - Bilateral breast reduction surgery in March 2025  Weight management and bariatric surgery - Gastric sleeve procedure in 2021 with approximately 100-pound weight loss, weight maintained since surgery - Expresses concern about current weight and difficulty losing additional weight, possibly related to menopausal changes  Functional status and physical activity - Engages in daily 30-minute walks around her neighborhood without difficulty  Labs   Lab Results  Component Value Date   CHOL 156 06/16/2024   HDL 55.90 06/16/2024   LDLCALC 88 06/16/2024   TRIG 59.0 06/16/2024   CHOLHDL 3 06/16/2024   No results found for: LIPOA  Recent Labs    10/20/23 1215 06/16/24 0953  NA 137 136  K 4.1 3.5  CL 108 101  CO2 25 27  GLUCOSE 77 80  BUN 10 8  CREATININE 0.79 0.74  CALCIUM 8.7* 9.2  GFRNONAA >60  --     Lab Results  Component Value Date   ALT 10 06/16/2024   AST 17 06/16/2024   ALKPHOS 58 06/16/2024   BILITOT 0.8 06/16/2024      Latest Ref Rng & Units 06/16/2024    9:53 AM 10/20/2023   12:15 PM 12/21/2022    2:09 PM  CBC  WBC 4.0 - 10.5 K/uL 4.6  4.4  5.9   Hemoglobin 12.0 - 15.0 g/dL 88.0  86.6  86.7   Hematocrit 36.0 - 46.0 %  36.7  41.0  40.2   Platelets 150.0 - 400.0 K/uL 237.0  217  214.0    Lab Results  Component Value Date   HGBA1C 5.9 06/03/2015    Lab Results  Component Value Date   TSH 0.96 11/12/2023     ROS  Review of Systems  Cardiovascular:  Negative for chest pain, dyspnea on exertion and leg swelling.   Physical Exam:   VS:  BP 113/73 (BP Location: Left Arm, Patient Position: Sitting, Cuff Size: Large)   Pulse 74   Resp 14   Ht 5' 4 (1.626 m)   Wt 232 lb (105.2 kg)   LMP 05/23/2024 (Exact Date)   SpO2 98%   BMI 39.82 kg/m    Wt Readings from Last 3 Encounters:  06/20/24 232 lb  (105.2 kg)  06/16/24 231 lb 6.4 oz (105 kg)  06/09/24 232 lb (105.2 kg)    BP Readings from Last 3 Encounters:  06/20/24 113/73  06/16/24 (!) 122/100  06/09/24 124/78   Physical Exam Constitutional:      Appearance: She is morbidly obese.  Neck:     Vascular: No carotid bruit or JVD.  Cardiovascular:     Rate and Rhythm: Normal rate and regular rhythm.     Pulses: Intact distal pulses.     Heart sounds: Normal heart sounds. No murmur heard.    No gallop.  Pulmonary:     Effort: Pulmonary effort is normal.     Breath sounds: Normal breath sounds.  Chest:     Chest wall: Tenderness (left costochondral junction) present.  Abdominal:     General: Bowel sounds are normal.     Palpations: Abdomen is soft.  Musculoskeletal:     Right lower leg: No edema.     Left lower leg: No edema.    EKG:         ASSESSMENT AND PLAN: .      ICD-10-CM   1. Musculoskeletal chest pain  R07.89     2. Primary hypertension  I10     3. Class 2 severe obesity due to excess calories with serious comorbidity and body mass index (BMI) of 39.0 to 39.9 in adult  E66.812    Z68.39       Assessment and Plan Assessment & Plan Musculoskeletal chest wall pain Intermittent chest pain, primarily on the right side, radiating to the arm, exacerbated by deep breathing, likely due to a strain or tear in the cartilage between the ribs and breastbone, causing muscle spasm. No cardiac etiology suspected as EKG and physical exam are normal. - Advise taking Tylenol  for pain management - No further cardiac testing required  Essential hypertension Blood pressure is well controlled with current medication regimen, including amlodipine 5 mg once daily and metoprolol  tartrate 50 mg twice daily. - Continue current antihypertensive medications: amlodipine 5 mg once daily and metoprolol  tartrate 50 mg twice daily  Obesity with previous bariatric surgery Body mass index is 39, indicating obesity despite previous  gastric sleeve surgery in 2021 with a weight loss of approximately 100 pounds. Continued weight management is necessary. Discussed the importance of investing in health and making lifestyle changes to sustain weight loss and prevent future health issues. - Recommend consulting with a dietitian or wellness clinic for weight management support - Encourage lifestyle changes focusing on diet and exercise   Follow up: As needed  Signed,  Gordy Bergamo, MD, [facility] 06/20/2024, 11:51 AM [facility] [address] [place], [place] [address]  Phone: [phone]. Fax:  [phone]

## 2024-06-20 NOTE — Patient Instructions (Signed)
 Medication Instructions:   Your physician recommends that you continue on your current medications as directed. Please refer to the Current Medication list given to you today.  *If you need a refill on your cardiac medications before your next appointment, please call your pharmacy*  Lab Work: NONE ORDERED  TODAY    If you have labs (blood work) drawn today and your tests are completely normal, you will receive your results only by: MyChart Message (if you have MyChart) OR A paper copy in the mail If you have any lab test that is abnormal or we need to change your treatment, we will call you to review the results.  Testing/Procedures: NONE ORDERED  TODAY   Follow-Up: At [facility], you and your health needs are our priority.  As part of our continuing mission to provide you with exceptional heart care, our providers are all part of one team.  This team includes your primary Cardiologist (physician) and Advanced Practice Providers or APPs (Physician Assistants and Nurse Practitioners) who all work together to provide you with the care you need, when you need it.  Your next appointment: CONTACT [facility] [phone] AS NEEDED FOR  ANY CARDIAC RELATED SYMPTOMS   We recommend signing up for the patient portal called MyChart.  Sign up information is provided on this After Visit Summary.  MyChart is used to connect with patients for Virtual Visits (Telemedicine).  Patients are able to view lab/test results, encounter notes, upcoming appointments, etc.  Non-urgent messages can be sent to your provider as well.   To learn more about what you can do with MyChart, go to [email or web address].   Other Instructions

## 2024-06-21 ENCOUNTER — Telehealth (HOSPITAL_BASED_OUTPATIENT_CLINIC_OR_DEPARTMENT_OTHER): Payer: Self-pay

## 2024-06-21 ENCOUNTER — Ambulatory Visit: Admitting: Hematology

## 2024-06-23 ENCOUNTER — Ambulatory Visit: Payer: Self-pay | Admitting: Family Medicine

## 2024-06-23 ENCOUNTER — Ambulatory Visit (HOSPITAL_BASED_OUTPATIENT_CLINIC_OR_DEPARTMENT_OTHER)
Admission: RE | Admit: 2024-06-23 | Discharge: 2024-06-23 | Disposition: A | Discharge status: Home / Self Care | Source: Ambulatory Visit | Attending: Family Medicine | Admitting: Family Medicine

## 2024-06-23 DIAGNOSIS — R7989 Other specified abnormal findings of blood chemistry: Secondary | ICD-10-CM | POA: Insufficient documentation

## 2024-06-23 DIAGNOSIS — R079 Chest pain, unspecified: Secondary | ICD-10-CM | POA: Diagnosis present

## 2024-06-23 MED ORDER — IOHEXOL 350 MG/ML SOLN
75.0000 mL | Freq: Once | INTRAVENOUS | Status: AC | PRN
  Administered 2024-06-23: 75 mL via INTRAVENOUS

## 2024-06-26 ENCOUNTER — Inpatient Hospital Stay: Attending: Nurse Practitioner | Admitting: Nurse Practitioner

## 2024-06-26 ENCOUNTER — Encounter: Payer: Self-pay | Admitting: Nurse Practitioner

## 2024-06-26 VITALS — BP 131/83 | HR 61 | Temp 98.0°F | Resp 16 | Ht 64.0 in | Wt 230.9 lb

## 2024-06-26 DIAGNOSIS — Z803 Family history of malignant neoplasm of breast: Secondary | ICD-10-CM | POA: Diagnosis not present

## 2024-06-26 DIAGNOSIS — Z79810 Long term (current) use of selective estrogen receptor modulators (SERMs): Secondary | ICD-10-CM | POA: Diagnosis not present

## 2024-06-26 DIAGNOSIS — Z170 Estrogen receptor positive status [ER+]: Secondary | ICD-10-CM | POA: Insufficient documentation

## 2024-06-26 DIAGNOSIS — C50412 Malignant neoplasm of upper-outer quadrant of left female breast: Secondary | ICD-10-CM | POA: Insufficient documentation

## 2024-06-26 DIAGNOSIS — Z1721 Progesterone receptor positive status: Secondary | ICD-10-CM | POA: Insufficient documentation

## 2024-06-26 DIAGNOSIS — Z800 Family history of malignant neoplasm of digestive organs: Secondary | ICD-10-CM | POA: Diagnosis not present

## 2024-06-26 DIAGNOSIS — Z1732 Human epidermal growth factor receptor 2 negative status: Secondary | ICD-10-CM | POA: Insufficient documentation

## 2024-06-26 MED ORDER — TAMOXIFEN CITRATE 20 MG PO TABS: 20.0000 mg | ORAL_TABLET | Freq: Every day | ORAL | 1 refills | Status: AC

## 2024-06-26 NOTE — Progress Notes (Signed)
 CLINIC:  Survivorship   Patient Care Team: [name], [name], DO as PCP - General [name], RN as Oncology Nurse Navigator [name], MD as Consulting Physician (Hematology) [name], MD as Consulting Physician (Radiation Oncology) [name], MD as Consulting Physician (General Surgery)   REASON FOR VISIT:  Routine follow-up post-treatment for a recent history of breast cancer.  BRIEF ONCOLOGIC HISTORY:  Oncology History  Malignant neoplasm of upper-outer quadrant of left breast in female, estrogen receptor positive (HCC)  10/08/2023 Cancer Staging   Staging form: Breast, AJCC 8th Edition - Clinical stage from 10/08/2023: Stage IIA (cT3, cN0, cM0, G2, ER+, PR+, HER2-) - Signed by [name], MD on 10/19/2023 Stage prefix: Initial diagnosis Histologic grading system: 3 grade system   10/18/2023 Initial Diagnosis   Malignant neoplasm of upper-outer quadrant of left breast in female, estrogen receptor positive (HCC)   11/01/2023 Genetic Testing   Negative [facility] CancerNext+RNAinsight Panel.  Report date is 11/04/2023.   The [employer] CancerNext+RNAinsight Panel includes sequencing, rearrangement analysis, and RNA analysis for the following 39 genes: APC, ATM, BAP1, BARD1, BMPR1A, BRCA1, BRCA2, BRIP1, CDH1, CDKN2A, CHEK2, FH, FLCN, MET, MLH1, MSH2, MSH6, MUTYH, NF1, NTHL1, PALB2, PMS2, PTEN, RAD51C, RAD51D, SMAD4, STK11, TP53, TSC1, TSC2, and VHL (sequencing and deletion/duplication); AXIN2, HOXB13, MBD4, MSH3, POLD1 and POLE (sequencing only); EPCAM and GREM1 (deletion/duplication only).   01/07/2024 Cancer Staging   Staging form: Breast, AJCC 8th Edition - Pathologic stage from 01/07/2024: Stage IA (pT1c, pN0(sn), cM0, G3, ER+, PR+, HER2-, Oncotype DX score: 17) - Signed by Lanell Donald Stagger, PA-C on 01/07/2024 Stage prefix: Initial diagnosis Method of lymph node assessment: Sentinel lymph node biopsy Multigene prognostic tests performed: Oncotype DX Recurrence score  range: Greater than or equal to 11 Histologic grading system: 3 grade system   06/26/2024 Survivorship   SCP delivered by Lydie Stammen, NP   06/26/2024 -  Anti-estrogen oral therapy   Begin Tamoxifen 20 mg po once daily     INTERVAL HISTORY:  Jody [name] presents to the Survivorship Clinic today for our initial meeting to review her survivorship care plan detailing her treatment course for breast cancer, as well as monitoring long-term side effects of that treatment, education regarding health maintenance, screening, and overall wellness and health promotion.     Overall, Jody [name] reports that she tolerated surgery and radiation well. She has recovered. For about 1 month she has right chest pain and vomiting. Has seen PCP and cardiology, meds have been adjusted. Still having menstrual periods, had EMB recently which was benign. LMP 05/23/24. Some baseline joint pain from arthritis in her hips and knees.     REVIEW OF SYSTEMS:  Review of Systems - Oncology Breast: Denies any new nodularity, masses, tenderness, nipple changes, or nipple discharge.      ONCOLOGY TREATMENT TEAM:  1. Surgeon:  Dr. Vanderbilt at [facility] 2. Medical Oncologist: Dr. Lanny  3. Radiation Oncologist: Dr. Dewey     PAST MEDICAL/SURGICAL HISTORY:  Past Medical History:  Diagnosis Date   Anemia    Anxiety    Asthma    Breast cancer (HCC)    Depression    Endometriosis    GERD (gastroesophageal reflux disease)    History of anal fissures    History of gallstones    Hypertension    Migraine headache with aura    Migraines    without aura   Obesity    Past Surgical History:  Procedure Laterality Date  BREAST BIOPSY Left 10/08/2023   MM LT BREAST BX W LOC DEV 1ST LESION IMAGE BX SPEC STEREO GUIDE 10/08/2023 [facility]   BREAST BIOPSY Left 10/08/2023   MM LT BREAST BX W LOC DEV EA AD LESION IMG BX SPEC STEREO GUIDE 10/08/2023 [facility]   BREAST BIOPSY  11/16/2023   MM LT  RADIOACTIVE SEED LOC MAMMO GUIDE 11/16/2023 [facility]   BREAST BIOPSY  11/16/2023   MM LT RADIOACTIVE SEED EA ADD LESION LOC MAMMO GUIDE 11/16/2023 [facility]   BREAST LUMPECTOMY Left 12/13/2023   Procedure: BREAST LUMPECTOMY;  Surgeon: [name], MD;  Location: [facility];  Service: General;  Laterality: Left;  RE-EXCISION LEFT BREAST CANCER   BREAST LUMPECTOMY WITH RADIOACTIVE SEED AND SENTINEL LYMPH NODE BIOPSY Left 11/17/2023   Procedure: LEFT BREAST SEED GUIDED BRACKETED LUMPECTOMY AND SENTINEL LYMPH NODE BIOPSY;  Surgeon: [name], MD;  Location: [facility];  Service: General;  Laterality: Left;   BREAST REDUCTION SURGERY Bilateral 12/13/2023   Procedure: MAMMOPLASTY, REDUCTION;  Surgeon: Lowery Estefana RAMAN, DO;  Location: [facility];  Service: Plastics;  Laterality: Bilateral;  ONCOPLASTIC BILATERAL BREAST REDUCTION WITH LIPSUCTION   CHOLECYSTECTOMY     ESOPHAGOGASTRODUODENOSCOPY     FLEXIBLE SIGMOIDOSCOPY  03/09/2012   Procedure: FLEXIBLE SIGMOIDOSCOPY;  Surgeon: Lamar JONETTA Aho, MD;  Location: [facility];  Service: Endoscopy;  Laterality: N/A;   gastric sleeve surgery  03/14/2021   IR IMAGE GUIDED FLUID DRAIN BY CATHETER  01/28/2024   TUBAL LIGATION     Essure     ALLERGIES:  No Known Allergies   CURRENT MEDICATIONS:  Outpatient Encounter Medications as of 06/26/2024  Medication Sig   albuterol  (VENTOLIN  HFA) 108 (90 Base) MCG/ACT inhaler INHALE 2 PUFFS 4 TIMES A DAY AS NEEDED   amLODipine (NORVASC) 5 MG tablet Take 1 tablet (5 mg total) by mouth daily.   famotidine  (PEPCID ) 20 MG tablet TAKE 1 TABLET BY MOUTH TWICE A DAY   fluticasone  (FLONASE ) 50 MCG/ACT nasal spray SPRAY 2 SPRAYS INTO EACH NOSTRIL EVERY DAY   hydrocortisone  (ANUSOL -HC) 25 MG suppository PLACE 1 SUPPOSITORY RECTALLY 2 (TWO) TIMES DAILY AS NEEDED FOR HEMORRHOIDS OR ANAL ITCHING   LORazepam  (ATIVAN ) 1 MG tablet Take 0.5 tablets (0.5 mg  total) by mouth daily as needed for anxiety.   metoprolol  tartrate (LOPRESSOR ) 50 MG tablet Take 1 tablet (50 mg total) by mouth 2 (two) times daily.   Multiple Vitamin (MULTI VITAMIN PO) Take by mouth.   Multiple Vitamins-Iron (MULTIVITAMINS WITH IRON) TABS tablet Take 1 tablet by mouth daily.   ondansetron  (ZOFRAN -ODT) 4 MG disintegrating tablet Take 1 tablet (4 mg total) by mouth every 8 (eight) hours as needed for nausea or vomiting.   pantoprazole (PROTONIX) 40 MG tablet Take 1 tablet (40 mg total) by mouth daily.   tamoxifen (NOLVADEX) 20 MG tablet Take 1 tablet (20 mg total) by mouth daily.   traMADol  (ULTRAM ) 50 MG tablet Take 1 tablet (50 mg total) by mouth every 6 (six) hours as needed.   No facility-administered encounter medications on file as of 06/26/2024.     ONCOLOGIC FAMILY HISTORY:  Family History  Problem Relation Age of Onset   Breast cancer Mother 55       no genetic testing   Hypertension Mother    Hepatitis Father 26       Hep C   Diabetes Father    Arthritis Father    Hypertension Father  Kidney disease Father    Heart attack Father 20   Breast cancer Maternal Aunt        dx late 11s; mother's mat half sister   Cancer Maternal Uncle        x2 mat uncles; unk type; dx >50   Coronary artery disease Maternal Grandmother    Cataracts Maternal Grandmother    Glaucoma Maternal Grandmother    Heart disease Maternal Grandmother    Colon cancer Paternal Grandmother        dx >50   Obesity Paternal Grandmother    Cancer Cousin        unk type; dx 15s; mat female cousin   Obesity Other    Stomach cancer Neg Hx    Rectal cancer Neg Hx      GENETIC COUNSELING/TESTING: Yes, negative  SOCIAL HISTORY:  Ivone Salynn Rosenau denies any current or history of tobacco, alcohol, or illicit drug use.     PHYSICAL EXAMINATION:  Vital Signs:   Vitals:   06/26/24 1044  BP: 131/83  Pulse: 61  Resp: 16  Temp: 98 F (36.7 C)  SpO2: 100%   Filed Weights    06/26/24 1044  Weight: 230 lb 14.4 oz (104.7 kg)   General: Well-nourished, well-appearing female in no acute distress.   HEENT:  Sclerae anicteric.  Lymph: No cervical, supraclavicular, or infraclavicular lymphadenopathy noted on palpation.  Cardiovascular: Regular rate and rhythm. Respiratory: Clear; breathing non-labored.  GI: Abdomen soft and round; non-tender, non-distended. Bowel sounds normoactive.  Neuro: No focal deficits. Steady gait.  Psych: Mood and affect normal and appropriate for situation.  Extremities: No edema. MSK: No focal spinal tenderness to palpation.  Full range of motion in bilateral upper extremities Skin: Warm and dry. Breast exam: No nipple discharge or inversion.  S/p left lumpectomy and bilateral mammoplasty, incisions completely healed.  No palpable mass or nodularity in either breast or axilla that I could appreciate  LABORATORY DATA:  None for this visit.  DIAGNOSTIC IMAGING:  None for this visit.      ASSESSMENT AND PLAN:  Ms.. [name] is a pleasant 53 y.o. female with Stage 1 left breast invasive ductal carcinoma, ER+/PR+/HER2-, diagnosed in 10/2023, treated with lumpectomy and adjuvant radiation therapy.  She presents to the Survivorship Clinic for our initial meeting and routine follow-up post-completion of treatment for breast cancer.    #. Stage I left breast cancer:  Jody [name] has recovered well from definitive treatment for breast cancer.  She missed her last appointment therefore has not started antiestrogen therapy.  She is pre-/perimenopausal as of estrogen and FSH levels in 10/2023.  Recently had a benign EMB.  I recommend starting tamoxifen, potential risk/benefit and side effects reviewed including thrombosis, endometrial cancer.  She agrees to proceed and will start now.  She will return for follow-up with Dr. Lanny in 3 months.  Today, a comprehensive survivorship care plan and treatment summary was reviewed with the patient today  detailing her breast cancer diagnosis, treatment course, potential late/long-term effects of treatment, appropriate follow-up care with recommendations for the future, and patient education resources.  A copy of this summary, along with a letter will be sent to the patient's primary care provider via mail/fax/In Basket message after today's visit.     #. Bone health: Begin at menopause/switching to AI. In the meantime, she was encouraged to increase her consumption of foods rich in calcium, as well as increase her weight-bearing activities.  She was given education on specific  activities to promote bone health.  #. Cancer screening:  Due to Jody [name]'s history and her age, she should receive screening for skin cancers, colon cancer, and gynecologic cancers.  The information and recommendations are listed on the patient's comprehensive care plan/treatment summary and were reviewed in detail with the patient.    #. Health maintenance and wellness promotion: Jody [name] was encouraged to consume 5-7 servings of fruits and vegetables per day.  She was also encouraged to engage in moderate to vigorous exercise for 30 minutes per day most days of the week. S/p bariatric surgery. I will refer her to medical weight management.  She was instructed to limit her alcohol consumption and continue to abstain from tobacco use.     #. Support services/counseling: It is not uncommon for this period of the patient's cancer care trajectory to be one of many emotions and stressors.  We discussed an opportunity for her to participate in the next session of FYNN (Finding Your New Normal) support group series designed for patients after they have completed treatment.   Jody [name] was encouraged to take advantage of our many other support services programs, support groups, and/or counseling in coping with her new life as a cancer survivor after completing anti-cancer treatment.  She was offered support today through active  listening and expressive supportive counseling.  She was given information regarding our available services and encouraged to contact me with any questions or for help enrolling in any of our support group/programs.    Dispo:   -Begin tamoxifen 20 mg po daily -Referral to PT for sozo screening -Refer to medical weight management  -Return to cancer center 3 months   -Mammogram due in 09/2024 -Follow up with surgery as scheduled  -cc PCP for CTA results/management   Orders Placed This Encounter  Procedures   MM DIAG BREAST TOMO BILATERAL    Standing Status:   Future    Expected Date:   09/26/2024    Expiration Date:   06/26/2025    Reason for Exam (SYMPTOM  OR DIAGNOSIS REQUIRED):   L breast cancer 10/2023 s/p lumpectomy and radiation    Is the patient pregnant?:   No    [facility]erred [facility]   Ambulatory referral to Physical Therapy    Referral Priority:   Routine    Referral Type:   Physical Medicine    Referral Reason:   Specialty Services Required    Requested Specialty:   Physical Therapy    Number of Visits Requested:   1   Amb ref to Medical Nutrition Therapy-MNT    Referral Priority:   Routine    Referral Type:   Consultation    Referral Reason:   Specialty Services Required    Requested Specialty:   Nutrition    Number of Visits Requested:   1     She is welcome to return back to the Survivorship Clinic at any time; no additional follow-up needed at this time. Consider referral back to survivorship as a long-term survivor for continued surveillance  A total of (30) minutes of face-to-face time was spent with this patient with greater than 50% of that time in counseling and care-coordination.   Rohnan Bartleson, NP Survivorship Program [facility] [phone]   Note: PRIMARY CARE PROVIDER Antonio Cyndee Jamee JONELLE, [place] [phone]

## 2024-06-27 ENCOUNTER — Inpatient Hospital Stay: Admitting: Nurse Practitioner

## 2024-06-28 MED ORDER — AZITHROMYCIN 250 MG PO TABS: ORAL_TABLET | ORAL | 0 refills | Status: AC

## 2024-06-29 ENCOUNTER — Ambulatory Visit: Admitting: Hematology

## 2024-06-30 NOTE — Telephone Encounter (Unsigned)
 Copied from CRM #[id number]. Topic: Clinical - Lab/Test Results >> Jun 30, 2024  1:16 PM Harlene ORN wrote: Reason for CRM: Patient called. Returning a call from Nurse Heather  in regards to her imaging results

## 2024-07-31 ENCOUNTER — Encounter: Payer: Self-pay | Admitting: Nurse Practitioner

## 2024-07-31 ENCOUNTER — Other Ambulatory Visit: Payer: Self-pay | Admitting: Nurse Practitioner

## 2024-08-01 ENCOUNTER — Other Ambulatory Visit: Payer: Self-pay | Admitting: Family Medicine

## 2024-08-01 ENCOUNTER — Telehealth: Payer: Self-pay | Admitting: Family Medicine

## 2024-08-01 DIAGNOSIS — H40053 Ocular hypertension, bilateral: Secondary | ICD-10-CM

## 2024-08-01 NOTE — Telephone Encounter (Signed)
 Copied from CRM [id number]. Topic: Referral - Request for Referral >> Aug 01, 2024  2:05 PM Thersia BROCKS wrote: Did the patient discuss referral with their provider in the last year? No (If No - schedule appointment) (If Yes - send message)  Appointment offered? Yes  Type of order/referral and detailed reason for visit: Ophthalmologist  Preference of office, provider, location: No Preference   If referral order, have you been seen by this specialty before? No (If Yes, this issue or another issue? When? Where?  Can we respond through MyChart? Yes

## 2024-08-01 NOTE — Telephone Encounter (Signed)
 Spoke w/ Pt- having bilateral eye pain for 2 weeks, states she was seen by [facility] at [facility] approximately 1 week ago, they mentioned that she has elevated pressure in eyes, I asked if her if they mentioned sending her to a ophthalmologist for further glaucoma testing and she stated no. She is requesting a referral to an ophthalmologist for further care/testing.

## 2024-08-02 ENCOUNTER — Encounter: Payer: Self-pay | Admitting: Family Medicine

## 2024-08-25 ENCOUNTER — Ambulatory Visit
Admission: RE | Admit: 2024-08-25 | Discharge: 2024-08-25 | Disposition: A | Source: Ambulatory Visit | Attending: Nurse Practitioner

## 2024-08-25 DIAGNOSIS — C50412 Malignant neoplasm of upper-outer quadrant of left female breast: Secondary | ICD-10-CM

## 2024-09-04 ENCOUNTER — Ambulatory Visit: Attending: Hematology

## 2024-09-04 VITALS — Wt 234.0 lb

## 2024-09-04 DIAGNOSIS — I890 Lymphedema, not elsewhere classified: Secondary | ICD-10-CM | POA: Insufficient documentation

## 2024-09-04 DIAGNOSIS — Z483 Aftercare following surgery for neoplasm: Secondary | ICD-10-CM | POA: Insufficient documentation

## 2024-09-04 NOTE — Therapy (Signed)
 " OUTPATIENT PHYSICAL THERAPY SOZO SCREENING NOTE   Patient Name: Jody [name] MRN: [id number] DOB:1971-07-19, 53 y.o., female Today's Date: 09/04/2024  PCP: Antonio Cyndee Jamee JONELLE, DO REFERRING PROVIDER: [name], MD   PT End of Session - 09/04/24 1636     Visit Number 4   # unchanged due to screen only   PT Start Time 1635    PT Stop Time 1655    PT Time Calculation (min) 20 min    Activity Tolerance Patient tolerated treatment well    Behavior During Therapy WFL for tasks assessed/performed          Past Medical History:  Diagnosis Date   Anemia    Anxiety    Asthma    Breast cancer (HCC)    Depression    Endometriosis    GERD (gastroesophageal reflux disease)    History of anal fissures    History of gallstones    Hypertension    Migraine headache with aura    Migraines    without aura   Obesity    Past Surgical History:  Procedure Laterality Date   BREAST BIOPSY Left 10/08/2023   MM LT BREAST BX W LOC DEV 1ST LESION IMAGE BX SPEC STEREO GUIDE 10/08/2023 [facility]   BREAST BIOPSY Left 10/08/2023   MM LT BREAST BX W LOC DEV EA AD LESION IMG BX SPEC STEREO GUIDE 10/08/2023 [facility]   BREAST BIOPSY  11/16/2023   MM LT RADIOACTIVE SEED LOC MAMMO GUIDE 11/16/2023 [facility]   BREAST BIOPSY  11/16/2023   MM LT RADIOACTIVE SEED EA ADD LESION LOC MAMMO GUIDE 11/16/2023 [facility]   BREAST LUMPECTOMY Left 12/13/2023   Procedure: BREAST LUMPECTOMY;  Surgeon: [name], MD;  Location: [facility];  Service: General;  Laterality: Left;  RE-EXCISION LEFT BREAST CANCER   BREAST LUMPECTOMY WITH RADIOACTIVE SEED AND SENTINEL LYMPH NODE BIOPSY Left 11/17/2023   Procedure: LEFT BREAST SEED GUIDED BRACKETED LUMPECTOMY AND SENTINEL LYMPH NODE BIOPSY;  Surgeon: [name], MD;  Location: [facility];  Service: General;  Laterality: Left;   BREAST REDUCTION SURGERY Bilateral 12/13/2023   Procedure:  MAMMOPLASTY, REDUCTION;  Surgeon: Lowery Estefana RAMAN, DO;  Location: [facility];  Service: Plastics;  Laterality: Bilateral;  ONCOPLASTIC BILATERAL BREAST REDUCTION WITH LIPSUCTION   CHOLECYSTECTOMY     ESOPHAGOGASTRODUODENOSCOPY     FLEXIBLE SIGMOIDOSCOPY  03/09/2012   Procedure: FLEXIBLE SIGMOIDOSCOPY;  Surgeon: Lamar JONETTA Aho, MD;  Location: [facility];  Service: Endoscopy;  Laterality: N/A;   gastric sleeve surgery  03/14/2021   IR IMAGE GUIDED FLUID DRAIN BY CATHETER  01/28/2024   REDUCTION MAMMAPLASTY     TUBAL LIGATION     Essure   Patient Active Problem List   Diagnosis Date Noted   Well woman exam with routine gynecological exam 11/12/2023   Perimenopause 11/12/2023   Anxiety 11/12/2023   Genetic testing 11/02/2023   Malignant neoplasm of upper-outer quadrant of left breast in female, estrogen receptor positive (HCC) 10/18/2023   Bronchitis 12/21/2022   Colon cancer screening 12/21/2022   Allergies 12/21/2022   Need for hepatitis C screening test 12/21/2022   Abnormal vaginal bleeding 12/30/2020   Endometriosis 08/21/2019   Low back pain with radiation 04/28/2019   Stress reaction 05/16/2017   Essential hypertension 06/18/2015   Hyperglycemia 06/18/2015   Hemorrhoids 01/31/2012   Fatigue 12/31/2011   Dyspepsia 12/31/2011   B12 deficiency 01/13/2011   Anxiety and depression 12/26/2010  Migraines 12/15/2010   Stress 12/15/2010   OBESITY, MORBID 04/04/2007    REFERRING DIAG: left breast cancer at risk for lymphedema, lymphedema I89.0 stage 0-1  THERAPY DIAG:  Aftercare following surgery for neoplasm  PERTINENT HISTORY: Patient was diagnosed on 10/20/23 with left grade IDC with DCIS measures 5.1 cm and is located in the upper outer quadrant. It is ER/PR positive, HER2 neg with a Ki67 of 5%. Lt breast lumpectomy with SLNB on 11/17/23 with 5 negative nodes removed. re-excision and a reduction with Dr. Lowery 12/13/23. Then will have radiation.    PRECAUTIONS: left UE Lymphedema risk, None  SUBJECTIVE: Pt returns for her 3 month L-Dex screen. I have a lot of tightness running down my arm into my forearm.  PAIN:  Are you having pain? No  SOZO SCREENING: Patient was assessed today using the SOZO machine to determine the lymphedema index score. This was compared to her baseline score. It was determined that she is NOT within the recommended range when compared to her baseline and so she was fitted for a compression garment while in the clinic today (size V, black) but this wasn't best fit so will work on ordering her one possibly from [place] . It is recommended she return in 1 month to be reassessed. If she continues to measure outside the recommended range, physical therapy treatment will be recommended at that time and a referral requested.  Pt wants to try sleeve for 1 month to see if this will help cording first, then if not will consider returning to physical therapy.    L-DEX FLOWSHEETS - 09/04/24 1600       L-DEX LYMPHEDEMA SCREENING   Measurement Type Unilateral    L-DEX MEASUREMENT EXTREMITY Upper Extremity    POSITION  Standing    DOMINANT SIDE Right    At Risk Side Left    BASELINE SCORE (UNILATERAL) 3.4    L-DEX SCORE (UNILATERAL) 11.3    VALUE CHANGE (UNILAT) 7.9         P: Pt to return in 1 month for SOZO reassess. If still elevated and/or still dealing with cording symptoms may benefit from physical therapy to address symptoms.    Aden Berwyn Caldron, PTA 09/04/2024, 5:11 PM  Saddie Raw, PT 09/05/2024, 3:06 PM   Added lymphedema diagnosis for compression        "

## 2024-09-11 ENCOUNTER — Other Ambulatory Visit: Payer: Self-pay | Admitting: Family Medicine

## 2024-09-11 DIAGNOSIS — K219 Gastro-esophageal reflux disease without esophagitis: Secondary | ICD-10-CM

## 2024-09-27 ENCOUNTER — Other Ambulatory Visit: Payer: Self-pay

## 2024-09-27 DIAGNOSIS — Z170 Estrogen receptor positive status [ER+]: Secondary | ICD-10-CM

## 2024-09-28 ENCOUNTER — Inpatient Hospital Stay: Admitting: Hematology

## 2024-09-28 ENCOUNTER — Inpatient Hospital Stay: Attending: Nurse Practitioner

## 2024-09-28 VITALS — BP 140/94 | HR 68 | Temp 98.7°F | Ht 64.0 in | Wt 235.6 lb

## 2024-09-28 DIAGNOSIS — Z79899 Other long term (current) drug therapy: Secondary | ICD-10-CM | POA: Diagnosis not present

## 2024-09-28 DIAGNOSIS — Z170 Estrogen receptor positive status [ER+]: Secondary | ICD-10-CM

## 2024-09-28 DIAGNOSIS — H538 Other visual disturbances: Secondary | ICD-10-CM | POA: Diagnosis not present

## 2024-09-28 DIAGNOSIS — I10 Essential (primary) hypertension: Secondary | ICD-10-CM | POA: Insufficient documentation

## 2024-09-28 DIAGNOSIS — K219 Gastro-esophageal reflux disease without esophagitis: Secondary | ICD-10-CM | POA: Insufficient documentation

## 2024-09-28 DIAGNOSIS — Z79810 Long term (current) use of selective estrogen receptor modulators (SERMs): Secondary | ICD-10-CM | POA: Insufficient documentation

## 2024-09-28 DIAGNOSIS — Z9884 Bariatric surgery status: Secondary | ICD-10-CM | POA: Diagnosis not present

## 2024-09-28 DIAGNOSIS — R61 Generalized hyperhidrosis: Secondary | ICD-10-CM | POA: Insufficient documentation

## 2024-09-28 DIAGNOSIS — Z17411 Hormone receptor positive with human epidermal growth factor receptor 2 negative status: Secondary | ICD-10-CM | POA: Insufficient documentation

## 2024-09-28 DIAGNOSIS — R5383 Other fatigue: Secondary | ICD-10-CM | POA: Insufficient documentation

## 2024-09-28 DIAGNOSIS — E669 Obesity, unspecified: Secondary | ICD-10-CM | POA: Diagnosis not present

## 2024-09-28 DIAGNOSIS — C50412 Malignant neoplasm of upper-outer quadrant of left female breast: Secondary | ICD-10-CM | POA: Diagnosis not present

## 2024-09-28 DIAGNOSIS — R232 Flushing: Secondary | ICD-10-CM | POA: Insufficient documentation

## 2024-09-28 DIAGNOSIS — Z9049 Acquired absence of other specified parts of digestive tract: Secondary | ICD-10-CM | POA: Insufficient documentation

## 2024-09-28 LAB — CMP (CANCER CENTER ONLY)
ALT: 7 U/L (ref 0–44)
AST: 19 U/L (ref 15–41)
Albumin: 3.8 g/dL (ref 3.5–5.0)
Alkaline Phosphatase: 48 U/L (ref 38–126)
Anion gap: 11 (ref 5–15)
BUN: 14 mg/dL (ref 6–20)
CO2: 24 mmol/L (ref 22–32)
Calcium: 9.1 mg/dL (ref 8.9–10.3)
Chloride: 104 mmol/L (ref 98–111)
Creatinine: 0.88 mg/dL (ref 0.44–1.00)
GFR, Estimated: >60 mL/min
Glucose, Bld: 91 mg/dL (ref 70–99)
Potassium: 3.8 mmol/L (ref 3.5–5.1)
Sodium: 139 mmol/L (ref 135–145)
Total Bilirubin: 0.5 mg/dL (ref 0.0–1.2)
Total Protein: 6.9 g/dL (ref 6.5–8.1)

## 2024-09-28 LAB — CBC WITH DIFFERENTIAL (CANCER CENTER ONLY)
Abs Immature Granulocytes: 0.01 K/uL (ref 0.00–0.07)
Basophils Absolute: 0 K/uL (ref 0.0–0.1)
Basophils Relative: 0 %
Eosinophils Absolute: 0.1 K/uL (ref 0.0–0.5)
Eosinophils Relative: 2 %
HCT: 35.7 % — ABNORMAL LOW (ref 36.0–46.0)
Hemoglobin: 11.5 g/dL — ABNORMAL LOW (ref 12.0–15.0)
Immature Granulocytes: 0 %
Lymphocytes Relative: 46 %
Lymphs Abs: 2.5 K/uL (ref 0.7–4.0)
MCH: 25.7 pg — ABNORMAL LOW (ref 26.0–34.0)
MCHC: 32.2 g/dL (ref 30.0–36.0)
MCV: 79.7 fL — ABNORMAL LOW (ref 80.0–100.0)
Monocytes Absolute: 0.5 K/uL (ref 0.1–1.0)
Monocytes Relative: 9 %
Neutro Abs: 2.3 K/uL (ref 1.7–7.7)
Neutrophils Relative %: 43 %
Platelet Count: 185 K/uL (ref 150–400)
RBC: 4.48 MIL/uL (ref 3.87–5.11)
RDW: 16.8 % — ABNORMAL HIGH (ref 11.5–15.5)
WBC Count: 5.5 K/uL (ref 4.0–10.5)
nRBC: 0 % (ref 0.0–0.2)

## 2024-09-28 NOTE — Assessment & Plan Note (Addendum)
 Stage 1 left breast invasive ductal carcinoma, pT1cN0M0, G3,ER+/PR+/HER2-, Oncotype RS 17  -diagnosed in 10/2023, treated with lumpectomy and SLN biopsy -s/p adjuvant radiation therapy  -started tamoxifen  in 06/2024, does not tolerate well

## 2024-09-28 NOTE — Progress Notes (Signed)
 " [facility]   [phone] [phone]   Clinic Follow up Note   Patient Care Team: Jody [name], Jody SAUNDERS, DO as PCP - General Jody Nanetta SAILOR, RN as Oncology Nurse Navigator Jody Callander, MD as Consulting Physician (Hematology) Jody Rush, MD as Consulting Physician (Radiation Oncology) Jody Berg, MD as Consulting Physician (General Surgery)  Date of Service:  09/28/2024  CHIEF COMPLAINT: f/u of breast cancer   CURRENT THERAPY:  Tamoxifen  20mg  daily, will hold it for 2-3 months   Oncology History   Malignant neoplasm of upper-outer quadrant of left breast in female, estrogen receptor positive (HCC) Stage 1 left breast invasive ductal carcinoma, pT1cN0M0, G3,ER+/PR+/HER2-, Oncotype RS 17  -diagnosed in 10/2023, treated with lumpectomy and SLN biopsy -s/p adjuvant radiation therapy  -started tamoxifen  in 06/2024, does not tolerate well   Assessment & Plan Estrogen receptor positive breast cancer, status post treatment, on adjuvant tamoxifen  She is several months into adjuvant tamoxifen  therapy following treatment for estrogen receptor positive breast cancer. She is experiencing significant side effects impacting her quality of life.  - Held tamoxifen  for 2-3 months to assess for improvement in adverse effects. - Discussed possible resumption of tamoxifen  at a reduced dose (10 mg) if symptoms improve after drug holiday; advised she may split current tablets if needed. - Scheduled phone follow-up in 2-3 months to reassess symptoms and determine next steps regarding tamoxifen . - Scheduled in-person follow-up in 6 months. - Reviewed recent mammogram (December); no breast exam indicated at this visit.  Adverse effects of tamoxifen  (arthralgias, myalgias, hot flashes, mood changes, visual disturbances) She is experiencing severe arthralgias, myalgias, hot flashes, mood changes, and intermittent visual disturbances, all new since initiation of  tamoxifen  and significantly affecting daily functioning. - Held tamoxifen  for 2-3 months to determine if symptoms improve off therapy. - Advised to report symptom changes during follow-up call in 2-3 months. - Recommended discussing joint pain and other symptoms with primary care physician and physical therapist. - Advised to inform physical therapist about right arm pain for possible therapy or dry needling. - Discussed that obesity may contribute to joint pain and encouraged weight loss efforts.  Anemia She has mild anemia on recent laboratory testing. She is postmenopausal, not menstruating, with normal renal and hepatic function.  - Recommended continued use of bariatric multivitamin with iron; advised to ensure her multivitamin contains iron.  Obesity, status post gastric sleeve She has obesity, status post gastric sleeve, with some weight gain since surgery. Obesity may contribute to joint pain and functional limitations. - Encouraged weight loss efforts and discussed impact of obesity on joint pain. - Recommended follow-up with primary care physician for medical weight loss options. - Suggested optional follow-up with bariatric surgeon for additional guidance.  Depressive symptoms She reports mood changes and depressive symptoms, possibly related to tamoxifen . She has a remote history of fluoxetine  use, which is contraindicated with tamoxifen . She is not currently on antidepressants. - Advised to discuss mood symptoms with primary care physician. - Discussed that tamoxifen  can cause mood swings and that holding the medication may improve mood. - Cautioned regarding potential drug interactions between tamoxifen  and certain antidepressants; advised to confirm with prescriber before starting new medications.  Plan - Due to her multiple complaints, which could be related to tamoxifen , I recommend her to hold her tamoxifen  for 2 to 3 months, to see if her symptoms improve - Phone visit in  3 months, and may restart tamoxifen  at the reduced dose to 10  mg daily after next visit - Encouraged her to follow-up with her PCP for arthralgia and weight management   SUMMARY OF ONCOLOGIC HISTORY: Oncology History  Malignant neoplasm of upper-outer quadrant of left breast in female, estrogen receptor positive (HCC)  10/08/2023 Cancer Staging   Staging form: Breast, AJCC 8th Edition - Clinical stage from 10/08/2023: Stage IIA (cT3, cN0, cM0, G2, ER+, PR+, HER2-) - Signed by Jody Callander, MD on 10/19/2023 Stage prefix: Initial diagnosis Histologic grading system: 3 grade system   10/18/2023 Initial Diagnosis   Malignant neoplasm of upper-outer quadrant of left breast in female, estrogen receptor positive (HCC)   11/01/2023 Genetic Testing   Negative [employer] CancerNext+RNAinsight Panel.  Report date is 11/04/2023.   The [employer] CancerNext+RNAinsight Panel includes sequencing, rearrangement analysis, and RNA analysis for the following 39 genes: APC, ATM, BAP1, BARD1, BMPR1A, BRCA1, BRCA2, BRIP1, CDH1, CDKN2A, CHEK2, FH, FLCN, MET, MLH1, MSH2, MSH6, MUTYH, NF1, NTHL1, PALB2, PMS2, PTEN, RAD51C, RAD51D, SMAD4, STK11, TP53, TSC1, TSC2, and VHL (sequencing and deletion/duplication); AXIN2, HOXB13, MBD4, MSH3, POLD1 and POLE (sequencing only); EPCAM and GREM1 (deletion/duplication only).   01/07/2024 Cancer Staging   Staging form: Breast, AJCC 8th Edition - Pathologic stage from 01/07/2024: Stage IA (pT1c, pN0(sn), cM0, G3, ER+, PR+, HER2-, Oncotype DX score: 17) - Signed by Lanell Donald Stagger, PA-C on 01/07/2024 Stage prefix: Initial diagnosis Method of lymph node assessment: Sentinel lymph node biopsy Multigene prognostic tests performed: Oncotype DX Recurrence score range: Greater than or equal to 11 Histologic grading system: 3 grade system   06/26/2024 Survivorship   SCP delivered by Lacie Burton, NP   06/26/2024 -  Anti-estrogen oral therapy   Begin Tamoxifen  20 mg po once daily       Discussed the use of AI scribe software for clinical note transcription with the patient, who gave verbal consent to proceed.  History of Present Illness Jody [name] is a 54 year old female with left breast cancer, status post surgery, currently on adjuvant tamoxifen , who presents for follow-up regarding significant adverse effects from tamoxifen .  Since starting tamoxifen  3 months ago, she has had persistent arthralgias and muscle cramping, with severe right shoulder pain radiating to the wrist and thumb and knee pain that limits walking. Pain episodes can be intense enough to cause near-syncope, but she denies numbness or weakness. She is in rehabilitation for her left arm post-surgery but has not addressed right arm symptoms there. She stopped using her compression garment due to pain. A prior contrast MRI of the shoulder reportedly showed no abnormalities.  She reports marked fatigue, hot flashes, and night sweats that are most pronounced at night and frequently disrupt sleep, requiring fluids and ice at the bedside. She also has intermittent blurry vision and a sensation of eye pressure with near-syncope and transient marked reduction in vision, without diplopia. These symptoms started after her cancer diagnosis. Ophthalmology noted possible mild ocular pressure and prescribed bifocals, but visual symptoms continue intermittently.  Her weight has increased from 204 lb to 235 lb over 2 years despite prior gastric sleeve surgery in 2021. Her weight was 232 lb before starting tamoxifen  and is now 235 lb. She is amenorrheic for several months and uses a bariatric multivitamin with iron.  She reports mood lability with episodes of low mood, crying, and increased stress since August 2025, without loss of interest in activities. She has anxiety treated in the past with fluoxetine  and lorazepam  and currently uses ramelteon for anxiety and sleep.     All other  systems were reviewed with  the patient and are negative.  MEDICAL HISTORY:  Past Medical History:  Diagnosis Date   Anemia    Anxiety    Asthma    Breast cancer (HCC)    Depression    Endometriosis    GERD (gastroesophageal reflux disease)    History of anal fissures    History of gallstones    Hypertension    Migraine headache with aura    Migraines    without aura   Obesity     SURGICAL HISTORY: Past Surgical History:  Procedure Laterality Date   BREAST BIOPSY Left 10/08/2023   MM LT BREAST BX W LOC DEV 1ST LESION IMAGE BX SPEC STEREO GUIDE 10/08/2023 [facility]   BREAST BIOPSY Left 10/08/2023   MM LT BREAST BX W LOC DEV EA AD LESION IMG BX SPEC STEREO GUIDE 10/08/2023 [facility]   BREAST BIOPSY  11/16/2023   MM LT RADIOACTIVE SEED LOC MAMMO GUIDE 11/16/2023 [facility]   BREAST BIOPSY  11/16/2023   MM LT RADIOACTIVE SEED EA ADD LESION LOC MAMMO GUIDE 11/16/2023 [facility]   BREAST LUMPECTOMY Left 12/13/2023   Procedure: BREAST LUMPECTOMY;  Surgeon: Jody Berg, MD;  Location: [facility];  Service: General;  Laterality: Left;  RE-EXCISION LEFT BREAST CANCER   BREAST LUMPECTOMY WITH RADIOACTIVE SEED AND SENTINEL LYMPH NODE BIOPSY Left 11/17/2023   Procedure: LEFT BREAST SEED GUIDED BRACKETED LUMPECTOMY AND SENTINEL LYMPH NODE BIOPSY;  Surgeon: Jody Berg, MD;  Location: [facility];  Service: General;  Laterality: Left;   BREAST REDUCTION SURGERY Bilateral 12/13/2023   Procedure: MAMMOPLASTY, REDUCTION;  Surgeon: Lowery Estefana RAMAN, DO;  Location: [facility];  Service: Plastics;  Laterality: Bilateral;  ONCOPLASTIC BILATERAL BREAST REDUCTION WITH LIPSUCTION   CHOLECYSTECTOMY     ESOPHAGOGASTRODUODENOSCOPY     FLEXIBLE SIGMOIDOSCOPY  03/09/2012   Procedure: FLEXIBLE SIGMOIDOSCOPY;  Surgeon: Lamar JONETTA Aho, MD;  Location: [facility];  Service: Endoscopy;  Laterality: N/A;   gastric sleeve surgery  03/14/2021   IR  IMAGE GUIDED FLUID DRAIN BY CATHETER  01/28/2024   REDUCTION MAMMAPLASTY     TUBAL LIGATION     Essure    I have reviewed the social history and family history with the patient and they are unchanged from previous note.  ALLERGIES:  has no known allergies.  MEDICATIONS:  Current Outpatient Medications  Medication Sig Dispense Refill   albuterol  (VENTOLIN  HFA) 108 (90 Base) MCG/ACT inhaler INHALE 2 PUFFS 4 TIMES A DAY AS NEEDED 18 each 1   amLODipine  (NORVASC ) 5 MG tablet Take 1 tablet (5 mg total) by mouth daily. 90 tablet 3   famotidine  (PEPCID ) 20 MG tablet TAKE 1 TABLET BY MOUTH TWICE A DAY 180 tablet 0   fluticasone  (FLONASE ) 50 MCG/ACT nasal spray SPRAY 2 SPRAYS INTO EACH NOSTRIL EVERY DAY 48 mL 1   hydrocortisone  (ANUSOL -HC) 25 MG suppository PLACE 1 SUPPOSITORY RECTALLY 2 (TWO) TIMES DAILY AS NEEDED FOR HEMORRHOIDS OR ANAL ITCHING 24 suppository 0   LORazepam  (ATIVAN ) 1 MG tablet Take 0.5 tablets (0.5 mg total) by mouth daily as needed for anxiety. 30 tablet 0   metoprolol  tartrate (LOPRESSOR ) 50 MG tablet Take 1 tablet (50 mg total) by mouth 2 (two) times daily. 180 tablet 1   Multiple Vitamin (MULTI VITAMIN PO) Take by mouth.     Multiple Vitamins-Iron (MULTIVITAMINS WITH IRON) TABS tablet Take 1 tablet by mouth daily.     ondansetron  (ZOFRAN -ODT) 4  MG disintegrating tablet Take 1 tablet (4 mg total) by mouth every 8 (eight) hours as needed for nausea or vomiting. 20 tablet 0   pantoprazole  (PROTONIX ) 40 MG tablet Take 1 tablet (40 mg total) by mouth daily. 90 tablet 1   tamoxifen  (NOLVADEX ) 20 MG tablet Take 1 tablet (20 mg total) by mouth daily. 90 tablet 1   traMADol  (ULTRAM ) 50 MG tablet Take 1 tablet (50 mg total) by mouth every 6 (six) hours as needed. 20 tablet 0   No current facility-administered medications for this visit.    PHYSICAL EXAMINATION: ECOG PERFORMANCE STATUS: 1 - Symptomatic but completely ambulatory  Vitals:   09/28/24 1544 09/28/24 1545  BP: (!)  142/90 (!) 140/94  Pulse: 68   Temp: 98.7 F (37.1 C)   SpO2: 99%    Wt Readings from Last 3 Encounters:  09/28/24 235 lb 9.6 oz (106.9 kg)  09/04/24 234 lb (106.1 kg)  06/26/24 230 lb 14.4 oz (104.7 kg)     GENERAL:alert, no distress and comfortable SKIN: skin color, texture, turgor are normal, no rashes or significant lesions EYES: normal, Conjunctiva are pink and non-injected, sclera clear NECK: supple, thyroid  normal size, non-tender, without nodularity LYMPH:  no palpable lymphadenopathy in the cervical, axillary  LUNGS: clear to auscultation and percussion with normal breathing effort HEART: regular rate & rhythm and no murmurs and no lower extremity edema ABDOMEN:abdomen soft, non-tender and normal bowel sounds Musculoskeletal:no cyanosis of digits and no clubbing  NEURO: alert & oriented x 3 with fluent speech, no focal motor/sensory deficits  Physical Exam MEASUREMENTS: Weight- 235. NEUROLOGICAL: Strength 5/5 and sensation intact in upper extremities.  LABORATORY DATA:  I have reviewed the data as listed    Latest Ref Rng & Units 09/28/2024    3:13 PM 06/16/2024    9:53 AM 10/20/2023   12:15 PM  CBC  WBC 4.0 - 10.5 K/uL 5.5  4.6  4.4   Hemoglobin 12.0 - 15.0 g/dL 88.4  88.0  86.6   Hematocrit 36.0 - 46.0 % 35.7  36.7  41.0   Platelets 150 - 400 K/uL 185  237.0  217         Latest Ref Rng & Units 09/28/2024    3:13 PM 06/16/2024    9:53 AM 10/20/2023   12:15 PM  CMP  Glucose 70 - 99 mg/dL 91  80  77   BUN 6 - 20 mg/dL 14  8  10    Creatinine 0.44 - 1.00 mg/dL 9.11  9.25  9.20   Sodium 135 - 145 mmol/L 139  136  137   Potassium 3.5 - 5.1 mmol/L 3.8  3.5  4.1   Chloride 98 - 111 mmol/L 104  101  108   CO2 22 - 32 mmol/L 24  27  25    Calcium 8.9 - 10.3 mg/dL 9.1  9.2  8.7   Total Protein 6.5 - 8.1 g/dL 6.9  7.8  6.9   Total Bilirubin 0.0 - 1.2 mg/dL 0.5  0.8  0.9   Alkaline Phos 38 - 126 U/L 48  58  53   AST 15 - 41 U/L 19  17  15    ALT 0 - 44 U/L 7  10  6         RADIOGRAPHIC STUDIES: I have personally reviewed the radiological images as listed and agreed with the findings in the report. No results found.    No orders of the defined types were placed in this  encounter.  All questions were answered. The patient knows to call the clinic with any problems, questions or concerns. No barriers to learning was detected. The total time spent in the appointment was 30 minutes, including review of chart and various tests results, discussions about plan of care and coordination of care plan     Onita Mattock, MD 09/28/2024     "

## 2024-09-30 ENCOUNTER — Other Ambulatory Visit: Payer: Self-pay | Admitting: Family Medicine

## 2024-09-30 DIAGNOSIS — R1013 Epigastric pain: Secondary | ICD-10-CM

## 2024-10-06 ENCOUNTER — Ambulatory Visit

## 2024-10-06 ENCOUNTER — Ambulatory Visit: Payer: Self-pay

## 2024-10-06 ENCOUNTER — Ambulatory Visit: Admitting: Family Medicine

## 2024-10-06 NOTE — Telephone Encounter (Signed)
 FYI Only or Action Required?: Action required by provider: request for appointment.  Patient was last seen in primary care on 06/16/2024 by [name], Jody SAUNDERS, DO.  Called Nurse Triage reporting Blurred Vision.  Symptoms began about a month ago.  Interventions attempted: Nothing.  Symptoms are: unchanged.  Triage Disposition: See PCP When Office is Open (Within 3 Days)  Patient/caregiver understands and will follow disposition?: No, wishes to speak with PCP   Message from [name] sent at 10/06/2024  1:19 PM EST  Summary: Leg pain and blurred vision   Reason for Triage: Joint pain in legs and blurred vision.          Reason for Disposition  [1] Brief (now gone) blurred vision AND [2] unexplained  Answer Assessment - Initial Assessment Questions Patient called to reschedule appt; appt should be scheduled within 3 days.  Patient requesting appt to be rescheduled to next week; unable to arrive on time today.  Advised UC today or ED/911 if symptoms worsen. Patient verbalized understanding.   1. DESCRIPTION: How has your vision changed? (e.g., complete vision loss, blurred vision, double vision, floaters, etc.)     Intermittent blurred vision, joint pain Blurred vision and light sensitivy comes and goes. Last episode, Wednesday, duration 1-2 minutes Wakes up with ha, trouble sleeping at night, leg cramps/joint intermittent. Patient reports: Oncologist aware, request pcp appt; told to reduce tamoxifen  1/2 tab  Reports Dr. [name] aware, chronic, right sided arm weakness  2. LOCATION: One or both eyes? If one, ask: Which eye?     both 3. SEVERITY: Can you see anything? If Yes, ask: What can you see? (e.g., fine print)     yes 4. ONSET: When did this begin? Did it start suddenly or has this been gradual?     Months ago 5. PATTERN: Does this come and go, or has it been constant since it started? Come and goes      6. PAIN: Is there any pain in  your eye(s)?  (Scale 1-10; or mild, moderate, severe)     No pain 7. CONTACTS-GLASSES: Do you wear contacts or glasses?     Wear both corrective lenses 8. CAUSE: What do you think is causing this visual problem?     Med oncology;tamoxifen  9. OTHER SYMPTOMS: Do you have any other symptoms? (e.g., confusion, headache, arm or leg weakness, speech problems)    Denies HA, blurred vision, weakness/numbness, problems speech, confusion, diff walk/standing, diff breathing, dizziness, faint, chest pain, fall/injury  Protocols used: Vision Loss or Change-A-AH

## 2024-10-06 NOTE — Telephone Encounter (Signed)
 FYI. Pt going to UC/ED.

## 2025-03-29 ENCOUNTER — Inpatient Hospital Stay: Admitting: Hematology

## 2025-03-29 ENCOUNTER — Inpatient Hospital Stay
